# Patient Record
Sex: Female | Born: 1965 | Race: White | Hispanic: No | Marital: Single | Smoking: Current every day smoker
Health system: Southern US, Community
[De-identification: ages and names within clinical notes are randomized; demographics above are authoritative.]

## PROBLEM LIST (undated history)

## (undated) DIAGNOSIS — R519 Headache, unspecified: Secondary | ICD-10-CM

## (undated) DIAGNOSIS — M1990 Unspecified osteoarthritis, unspecified site: Secondary | ICD-10-CM

## (undated) DIAGNOSIS — J189 Pneumonia, unspecified organism: Secondary | ICD-10-CM

## (undated) DIAGNOSIS — K219 Gastro-esophageal reflux disease without esophagitis: Secondary | ICD-10-CM

## (undated) DIAGNOSIS — K5792 Diverticulitis of intestine, part unspecified, without perforation or abscess without bleeding: Secondary | ICD-10-CM

## (undated) DIAGNOSIS — J45909 Unspecified asthma, uncomplicated: Secondary | ICD-10-CM

## (undated) DIAGNOSIS — G709 Myoneural disorder, unspecified: Secondary | ICD-10-CM

## (undated) DIAGNOSIS — I10 Essential (primary) hypertension: Secondary | ICD-10-CM

## (undated) HISTORY — PX: ABLATION: SHX5711

## (undated) HISTORY — PX: DILATION AND CURETTAGE OF UTERUS: SHX78

## (undated) HISTORY — PX: COLONOSCOPY: SHX174

## (undated) HISTORY — PX: CARPAL TUNNEL RELEASE: SHX101

## (undated) HISTORY — PX: ESOPHAGUS SURGERY: SHX626

## (undated) HISTORY — PX: COLECTOMY: SHX59

## (undated) HISTORY — PX: ESOPHAGOGASTRODUODENOSCOPY: SHX1529

---

## 2008-11-02 DIAGNOSIS — J45909 Unspecified asthma, uncomplicated: Secondary | ICD-10-CM | POA: Insufficient documentation

## 2016-02-04 DIAGNOSIS — M5127 Other intervertebral disc displacement, lumbosacral region: Secondary | ICD-10-CM | POA: Insufficient documentation

## 2019-10-12 ENCOUNTER — Other Ambulatory Visit: Payer: Self-pay

## 2019-10-12 ENCOUNTER — Emergency Department
Admission: EM | Admit: 2019-10-12 | Discharge: 2019-10-12 | Disposition: A | Discharge status: Home / Self Care | Payer: Self-pay | Attending: Student in an Organized Health Care Education/Training Program | Admitting: Student in an Organized Health Care Education/Training Program

## 2019-10-12 DIAGNOSIS — M5432 Sciatica, left side: Secondary | ICD-10-CM | POA: Insufficient documentation

## 2019-10-12 DIAGNOSIS — J45909 Unspecified asthma, uncomplicated: Secondary | ICD-10-CM | POA: Insufficient documentation

## 2019-10-12 HISTORY — DX: Unspecified asthma, uncomplicated: J45.909

## 2019-10-12 MED ORDER — LIDOCAINE 5 % EX PTCH
1.0000 | MEDICATED_PATCH | CUTANEOUS | Status: DC
  Administered 2019-10-12: 1 via TRANSDERMAL
  Filled 2019-10-12: qty 1

## 2019-10-12 MED ORDER — CYCLOBENZAPRINE HCL 10 MG PO TABS: 10.0000 mg | ORAL_TABLET | Freq: Three times a day (TID) | ORAL | 0 refills | Status: DC | PRN

## 2019-10-12 MED ORDER — METHYLPREDNISOLONE 4 MG PO TBPK: ORAL_TABLET | ORAL | 0 refills | Status: DC

## 2019-10-12 MED ORDER — ORPHENADRINE CITRATE 30 MG/ML IJ SOLN
60.0000 mg | Freq: Two times a day (BID) | INTRAMUSCULAR | Status: DC
  Administered 2019-10-12: 15:00:00 60 mg via INTRAMUSCULAR
  Filled 2019-10-12: qty 2

## 2019-10-12 MED ORDER — OXYCODONE-ACETAMINOPHEN 7.5-325 MG PO TABS: 1.0000 | ORAL_TABLET | Freq: Four times a day (QID) | ORAL | 0 refills | Status: DC | PRN

## 2019-10-12 MED ORDER — HYDROMORPHONE HCL 1 MG/ML IJ SOLN
1.0000 mg | Freq: Once | INTRAMUSCULAR | Status: AC
  Administered 2019-10-12: 1 mg via INTRAMUSCULAR
  Filled 2019-10-12: qty 1

## 2019-10-12 NOTE — Discharge Instructions (Addendum)
Follow discharge care instruction take medication as directed.  Be advised medication may cause drowsiness. 

## 2019-10-12 NOTE — ED Triage Notes (Signed)
Pt comes via POV from home with c/o lower back pain. Pt states this started this week. 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  Pt states she feels it is her sciatic and it runs down he left leg.  Pt denies any recent injury

## 2019-10-12 NOTE — ED Notes (Signed)
Provider at bedside

## 2019-10-12 NOTE — ED Provider Notes (Signed)
[facility] Emergency Department Provider Note   ____________________________________________   First MD Initiated Contact with Patient 10/12/19 1337     (approximate)  I have reviewed the triage vital signs and the nursing notes.   HISTORY  Chief Complaint Back Pain    HPI Savannah Mcneil is a 54 y.o. female patient complain of right lower back pain with radicular component to the left lower extremity.  Patient has a history of chronic back pain with intermittent sciatica.  Patient denies bladder or bowel dysfunction.  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  Patient said this week she has performed multiple bending lifting techniques to move the patient.  Patient rates the pain as a 10/10.  Patient described the pain as "sharp/aching".  No relief with over-the-counter anti-inflammatory medications.         Past Medical History:  Diagnosis Date  . Asthma     There are no problems to display for this patient.   History reviewed. No pertinent surgical history.  Prior to Admission medications   Medication Sig Start Date End Date Taking? Authorizing Provider  cyclobenzaprine (FLEXERIL) 10 MG tablet Take 1 tablet (10 mg total) by mouth 3 (three) times daily as needed. 10/12/19   Joni Reining, PA-C  methylPREDNISolone (MEDROL DOSEPAK) 4 MG TBPK tablet Take Tapered dose as directed 10/12/19   Joni Reining, PA-C  oxyCODONE-acetaminophen (PERCOCET) 7.5-325 MG tablet Take 1 tablet by mouth every 6 (six) hours as needed for severe pain. 10/12/19   Joni Reining, PA-C    Allergies Patient has no allergy information on record.  No family history on file.  Social History Social History   Tobacco Use  . Smoking status: Not on file  Substance Use Topics  . Alcohol use: Not on file  . Drug use: Not on file    Review of Systems Constitutional: No fever/chills Eyes: No visual changes. ENT: No sore  throat. Cardiovascular: Denies chest pain. Respiratory: Denies shortness of breath. Gastrointestinal: No abdominal pain.  No nausea, no vomiting.  No diarrhea.  No constipation. Genitourinary: Negative for dysuria. Musculoskeletal: Positive for back pain. Skin: Negative for rash. Neurological: Negative for headaches, focal weakness or numbness.   ____________________________________________   PHYSICAL EXAM:  VITAL SIGNS: ED Triage Vitals  Enc Vitals Group     BP 10/12/19 1328 (!) 166/101     Pulse Rate 10/12/19 1328 81     Resp 10/12/19 1328 18     Temp 10/12/19 1328 98.6 F (37 C)     Temp src --      SpO2 10/12/19 1328 97 %     Weight 10/12/19 1326 218 lb (98.9 kg)     Height 10/12/19 1326 5' 5.25" (1.657 m)     Head Circumference --      Peak Flow --      Pain Score 10/12/19 1325 10     Pain Loc --      Pain Edu? --      Excl. in [place]? --     Constitutional: Alert and oriented. Well appearing and in no acute distress. Neck: NNo cervical spine tenderness to palpation. Hematological/Lymphatic/Immunilogical: No cervical lymphadenopathy. Cardiovascular: Normal rate, regular rhythm. Grossly normal heart sounds.  Good peripheral circulation. Respiratory: Normal respiratory effort.  No retractions. Lungs CTAB. Gastrointestinal: Soft and nontender. No distention. No abdominal bruits. No CVA tenderness. Genitourinary: Deferred Musculoskeletal: No obvious deformity to the lumbar spine.  Patient is moderate Guarding with palpation of L3-S1.  No lower extremity tenderness nor edema.  No joint effusions.  Patient negative straight leg test in the supine position. Neurologic:  Normal speech and language. No gross focal neurologic deficits are appreciated. No gait instability. Skin:  Skin is warm, dry and intact. No rash noted. Psychiatric: Mood and affect are normal. Speech and behavior are normal.  ____________________________________________   LABS (all labs ordered are listed,  but only abnormal results are displayed)  Labs Reviewed - No data to display ____________________________________________  EKG   ____________________________________________  RADIOLOGY  ED MD interpretation:    Official radiology report(s): No results found.  ____________________________________________   PROCEDURES  Procedure(s) performed (including Critical Care):  Procedures   ____________________________________________   INITIAL IMPRESSION / ASSESSMENT AND PLAN / ED COURSE  As part of my medical decision making, I reviewed the following data within the [place]     Patient presents with radicular back pain to left lower extremity.  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  Patient given discharge care instruction work note.  Patient advised take medication as directed.  Patient advised to follow-up with treating orthopedic doctor.    Savannah Mcneil was evaluated in Emergency Department on 10/12/2019 for the symptoms described in the history of present illness. She was evaluated in the context of the global COVID-19 pandemic, which necessitated consideration that the patient might be at risk for infection with the SARS-CoV-2 virus that causes COVID-19. Institutional protocols and algorithms that pertain to the evaluation of patients at risk for COVID-19 are in a state of rapid change based on information released by regulatory bodies including the CDC and federal and state organizations. These policies and algorithms were followed during the patient's care in the ED.       ____________________________________________   FINAL CLINICAL IMPRESSION(S) / ED DIAGNOSES  Final diagnoses:  Sciatica of left side     ED Discharge Orders         Ordered    oxyCODONE-acetaminophen (PERCOCET) 7.5-325 MG tablet  Every 6 hours PRN     10/12/19 1518    cyclobenzaprine (FLEXERIL) 10 MG tablet  3  times daily PRN     10/12/19 1518    methylPREDNISolone (MEDROL DOSEPAK) 4 MG TBPK tablet     10/12/19 1518           Note:  This document was prepared using Dragon voice recognition software and may include unintentional dictation errors.    Sable Feil, PA-C 10/12/19 1524    Merlyn Lot, MD 10/13/19 1102

## 2019-12-01 ENCOUNTER — Emergency Department
Admission: EM | Admit: 2019-12-01 | Discharge: 2019-12-01 | Disposition: A | Discharge status: Home / Self Care | Payer: Self-pay | Attending: Emergency Medicine | Admitting: Emergency Medicine

## 2019-12-01 ENCOUNTER — Other Ambulatory Visit: Payer: Self-pay

## 2019-12-01 DIAGNOSIS — M5416 Radiculopathy, lumbar region: Secondary | ICD-10-CM | POA: Insufficient documentation

## 2019-12-01 DIAGNOSIS — M79605 Pain in left leg: Secondary | ICD-10-CM | POA: Insufficient documentation

## 2019-12-01 DIAGNOSIS — J45909 Unspecified asthma, uncomplicated: Secondary | ICD-10-CM | POA: Insufficient documentation

## 2019-12-01 LAB — CBC
HCT: 41.4 % (ref 36.0–46.0)
Hemoglobin: 14.1 g/dL (ref 12.0–15.0)
MCH: 31.8 pg (ref 26.0–34.0)
MCHC: 34.1 g/dL (ref 30.0–36.0)
MCV: 93.5 fL (ref 80.0–100.0)
Platelets: 277 10*3/uL (ref 150–400)
RBC: 4.43 MIL/uL (ref 3.87–5.11)
RDW: 12.2 % (ref 11.5–15.5)
WBC: 6.5 10*3/uL (ref 4.0–10.5)
nRBC: 0 % (ref 0.0–0.2)

## 2019-12-01 LAB — COMPREHENSIVE METABOLIC PANEL
ALT: 14 U/L (ref 0–44)
AST: 18 U/L (ref 15–41)
Albumin: 4.1 g/dL (ref 3.5–5.0)
Alkaline Phosphatase: 78 U/L (ref 38–126)
Anion gap: 7 (ref 5–15)
BUN: 14 mg/dL (ref 6–20)
CO2: 27 mmol/L (ref 22–32)
Calcium: 8.9 mg/dL (ref 8.9–10.3)
Chloride: 103 mmol/L (ref 98–111)
Creatinine, Ser: 0.82 mg/dL (ref 0.44–1.00)
GFR calc Af Amer: >60 mL/min (ref >60)
GFR calc non Af Amer: >60 mL/min (ref >60)
Glucose, Bld: 103 mg/dL — ABNORMAL HIGH (ref 70–99)
Potassium: 4.1 mmol/L (ref 3.5–5.1)
Sodium: 137 mmol/L (ref 135–145)
Total Bilirubin: 0.6 mg/dL (ref 0.3–1.2)
Total Protein: 7.2 g/dL (ref 6.5–8.1)

## 2019-12-01 LAB — LIPASE, BLOOD: Lipase: 31 U/L (ref 11–51)

## 2019-12-01 MED ORDER — PREDNISONE 20 MG PO TABS
60.0000 mg | ORAL_TABLET | Freq: Once | ORAL | Status: AC
  Administered 2019-12-01: 60 mg via ORAL
  Filled 2019-12-01: qty 3

## 2019-12-01 MED ORDER — KETOROLAC TROMETHAMINE 10 MG PO TABS: 10.0000 mg | ORAL_TABLET | Freq: Four times a day (QID) | ORAL | 0 refills | Status: AC | PRN

## 2019-12-01 MED ORDER — KETOROLAC TROMETHAMINE 30 MG/ML IJ SOLN
30.0000 mg | Freq: Once | INTRAMUSCULAR | Status: AC
  Administered 2019-12-01: 30 mg via INTRAMUSCULAR
  Filled 2019-12-01: qty 1

## 2019-12-01 MED ORDER — SODIUM CHLORIDE 0.9% FLUSH: 3.0000 mL | Freq: Once | INTRAVENOUS | Status: DC

## 2019-12-01 MED ORDER — OXYCODONE-ACETAMINOPHEN 5-325 MG PO TABS
1.0000 | ORAL_TABLET | Freq: Once | ORAL | Status: AC
  Administered 2019-12-01: 16:00:00 1 via ORAL
  Filled 2019-12-01: qty 1

## 2019-12-01 MED ORDER — METHOCARBAMOL 500 MG PO TABS: 500.0000 mg | ORAL_TABLET | Freq: Three times a day (TID) | ORAL | 0 refills | Status: AC | PRN

## 2019-12-01 NOTE — ED Triage Notes (Signed)
Pt comes via POV from home with c/o left lower buttocks and leg pain. Pt states throbbing pain that goes down her whole left side.  Pt states this has been going on for 4 days now. Pt denies any N/V/D.  Pt states some abdominal pain at times.

## 2019-12-01 NOTE — ED Triage Notes (Signed)
FIRST NURSE NOTE- here for left side body pain X 4 days. NAD

## 2019-12-01 NOTE — ED Provider Notes (Signed)
Emergency Department Provider Note  ____________________________________________  Time seen: Approximately 3:49 PM  I have reviewed the triage vital signs and the nursing notes.   HISTORY  Chief Complaint Leg Pain and Abdominal Pain   Historian Patient     HPI Savannah Mcneil is a 54 y.o. female presents to the emergency department with left-sided low back pain that radiates along the posterior aspect of the left lower extremity.  Patient states that she has been able to ambulate without difficulty and denies bowel or bladder incontinence or saddle anesthesia.  She has been seen for lumbar radiculopathy multiple times in the past.  No fever or chills at home.  Patient denies abdominal pain.   Past Medical History:  Diagnosis Date  . Asthma      Immunizations up to date:  Yes.     Past Medical History:  Diagnosis Date  . Asthma     There are no problems to display for this patient.   History reviewed. No pertinent surgical history.  Prior to Admission medications   Medication Sig Start Date End Date Taking? Authorizing Provider  ketorolac (TORADOL) 10 MG tablet Take 1 tablet (10 mg total) by mouth every 6 (six) hours as needed for up to 5 days. 12/01/19 12/06/19  Lannie Fields, PA-C  methocarbamol (ROBAXIN) 500 MG tablet Take 1 tablet (500 mg total) by mouth every 8 (eight) hours as needed for up to 5 days. 12/01/19 12/06/19  Lannie Fields, PA-C    Allergies Patient has no allergy information on record.  No family history on file.  Social History Social History   Tobacco Use  . Smoking status: Not on file  Substance Use Topics  . Alcohol use: Not on file  . Drug use: Not on file     Review of Systems  Constitutional: No fever/chills Eyes:  No discharge ENT: No upper respiratory complaints. Respiratory: no cough. No SOB/ use of accessory muscles to breath Gastrointestinal:   No nausea, no vomiting.  No diarrhea.  No constipation. Musculoskeletal:  Patient has low back pain.  Skin: Negative for rash, abrasions, lacerations, ecchymosis.    ____________________________________________   PHYSICAL EXAM:  VITAL SIGNS: ED Triage Vitals  Enc Vitals Group     BP 12/01/19 1435 (!) 157/92     Pulse Rate 12/01/19 1435 68     Resp 12/01/19 1435 18     Temp 12/01/19 1435 98.5 F (36.9 C)     Temp src --      SpO2 12/01/19 1435 100 %     Weight 12/01/19 1436 220 lb (99.8 kg)     Height 12/01/19 1436 5' 5.25" (1.657 m)     Head Circumference --      Peak Flow --      Pain Score 12/01/19 1435 10     Pain Loc --      Pain Edu? --      Excl. in [place]? --      Constitutional: Alert and oriented. Well appearing and in no acute distress. Eyes: Conjunctivae are normal. PERRL. EOMI. Head: Atraumatic. ENT:      Ears: TMs are pearly.       Nose: No congestion/rhinnorhea.      Mouth/Throat: Mucous membranes are moist.  Neck: No stridor.  No cervical spine tenderness to palpation. Cardiovascular: Normal rate, regular rhythm. Normal S1 and S2.  Good peripheral circulation. Respiratory: Normal respiratory effort without tachypnea or retractions. Lungs CTAB. Good air entry to the bases  with no decreased or absent breath sounds Gastrointestinal: Bowel sounds x 4 quadrants. Soft and nontender to palpation. No guarding or rigidity. No distention. Musculoskeletal: Full range of motion to all extremities. No obvious deformities noted. Positive straight leg raise test on the left.  Neurologic:  Normal for age. No gross focal neurologic deficits are appreciated.  Skin:  Skin is warm, dry and intact. No rash noted. Psychiatric: Mood and affect are normal for age. Speech and behavior are normal.   ____________________________________________   LABS (all labs ordered are listed, but only abnormal results are displayed)  Labs Reviewed  COMPREHENSIVE METABOLIC PANEL - Abnormal; Notable for the following components:      Result Value   Glucose, Bld  103 (*)    All other components within normal limits  LIPASE, BLOOD  CBC  URINALYSIS, COMPLETE (UACMP) WITH MICROSCOPIC   ____________________________________________  EKG   ____________________________________________  RADIOLOGY   No results found.  ____________________________________________    PROCEDURES  Procedure(s) performed:     Procedures     Medications  sodium chloride flush (NS) 0.9 % injection 3 mL (3 mLs Intravenous Not Given 12/01/19 1519)  oxyCODONE-acetaminophen (PERCOCET/ROXICET) 5-325 MG per tablet 1 tablet (1 tablet Oral Given 12/01/19 1549)  ketorolac (TORADOL) 30 MG/ML injection 30 mg (30 mg Intramuscular Given 12/01/19 1550)  predniSONE (DELTASONE) tablet 60 mg (60 mg Oral Given 12/01/19 1549)     ____________________________________________   INITIAL IMPRESSION / ASSESSMENT AND PLAN / ED COURSE  Pertinent labs & imaging results that were available during my care of the patient were reviewed by me and considered in my medical decision making (see chart for details).      Assessment and plan Low back pain 54 year old female presents to the emergency department with left-sided low back pain with radiation into the left lower extremity.  Patient was given Percocet, Toradol and prednisone in the emergency department.  She reported that her pain improved significantly.  She was discharged with Toradol and Robaxin.  She was advised to follow-up with neurosurgery, Dr. Marcell Barlow.  All patient questions were answered.    ____________________________________________  FINAL CLINICAL IMPRESSION(S) / ED DIAGNOSES  Final diagnoses:  Lumbar radiculopathy      NEW MEDICATIONS STARTED DURING THIS VISIT:  ED Discharge Orders         Ordered    ketorolac (TORADOL) 10 MG tablet  Every 6 hours PRN     12/01/19 1734    methocarbamol (ROBAXIN) 500 MG tablet  Every 8 hours PRN     12/01/19 1734              This chart was dictated using voice  recognition software/Dragon. Despite best efforts to proofread, errors can occur which can change the meaning. Any change was purely unintentional.     Orvil Feil, PA-C 12/01/19 Deatra Robinson, MD 12/01/19 1946

## 2019-12-01 NOTE — ED Notes (Signed)
See triage note  Presents with lower back pain  States pain is mainly on the left and moves into left leg   Unable to bear wt

## 2019-12-15 ENCOUNTER — Encounter: Payer: Self-pay | Admitting: Emergency Medicine

## 2019-12-15 ENCOUNTER — Other Ambulatory Visit: Payer: Self-pay

## 2019-12-15 ENCOUNTER — Emergency Department
Admission: EM | Admit: 2019-12-15 | Discharge: 2019-12-15 | Disposition: A | Discharge status: Home / Self Care | Payer: Self-pay | Attending: Emergency Medicine | Admitting: Emergency Medicine

## 2019-12-15 DIAGNOSIS — K047 Periapical abscess without sinus: Secondary | ICD-10-CM | POA: Insufficient documentation

## 2019-12-15 DIAGNOSIS — J45909 Unspecified asthma, uncomplicated: Secondary | ICD-10-CM | POA: Insufficient documentation

## 2019-12-15 MED ORDER — TRAMADOL HCL 50 MG PO TABS: 50.0000 mg | ORAL_TABLET | Freq: Four times a day (QID) | ORAL | 0 refills | Status: DC | PRN

## 2019-12-15 MED ORDER — AMOXICILLIN 875 MG PO TABS: 875.0000 mg | ORAL_TABLET | Freq: Two times a day (BID) | ORAL | 0 refills | Status: DC

## 2019-12-15 NOTE — ED Provider Notes (Signed)
[facility] Emergency Department Provider Note  ____________________________________________   First MD Initiated Contact with Patient 12/15/19 1004     (approximate)  I have reviewed the triage vital signs and the nursing notes.   HISTORY  Chief Complaint Facial Swelling   HPI Savannah Mcneil is a 54 y.o. female presents to the ED with complaint of left-sided facial swelling that began slightly yesterday but is worse this morning.  Patient is unaware of any fever or chills.  Patient states that she is taken over-the-counter medication without any relief and for that reason she did not get any sleep last night.  Patient is a smoker.  Currently she rates her pain as a 9 out of 10.      Past Medical History:  Diagnosis Date  . Asthma     There are no problems to display for this patient.   History reviewed. No pertinent surgical history.  Prior to Admission medications   Medication Sig Start Date End Date Taking? Authorizing Provider  amoxicillin (AMOXIL) 875 MG tablet Take 1 tablet (875 mg total) by mouth 2 (two) times daily. 12/15/19   Johnn Hai, PA-C  traMADol (ULTRAM) 50 MG tablet Take 1 tablet (50 mg total) by mouth every 6 (six) hours as needed. 12/15/19   Johnn Hai, PA-C    Allergies Aspirin  No family history on file.  Social History Social History   Tobacco Use  . Smoking status: Not on file  Substance Use Topics  . Alcohol use: Not on file  . Drug use: Not on file    Review of Systems Constitutional: No fever/chills Eyes: No visual changes. ENT: Positive for dental caries.  Positive for left-sided jaw pain. Cardiovascular: Denies chest pain. Respiratory: Denies shortness of breath.  History of asthma, positive smoker. Musculoskeletal: Negative for muscle aches. Skin: Negative for rash. Neurological: Negative for  focal weakness or numbness.  ____________________________________________   PHYSICAL  EXAM:  VITAL SIGNS: ED Triage Vitals [12/15/19 1000]  Enc Vitals Group     BP (!) 152/81     Pulse Rate 83     Resp 20     Temp 98.9 F (37.2 C)     Temp Source Oral     SpO2 96 %     Weight 220 lb (99.8 kg)     Height 5\' 5"  (1.651 m)     Head Circumference      Peak Flow      Pain Score 9     Pain Loc      Pain Edu?      Excl. in [place]?    Constitutional: Alert and oriented. Well appearing and in no acute distress. Eyes: Conjunctivae are normal.  Head: Atraumatic. Nose: No congestion/rhinnorhea. Mouth/Throat: Mucous membranes are moist.  Oropharynx non-erythematous.  Left lower gums are edematous.  Patient has 2 teeth that are broken off at the gumline.  No active drainage was noted. Neck: No stridor.   Cardiovascular: Normal rate, regular rhythm. Grossly normal heart sounds.  Good peripheral circulation. Respiratory: Normal respiratory effort.  No retractions. Lungs minimal bilateral expiratory wheeze heard throughout. Neurologic:  Normal speech and language. No gross focal neurologic deficits are appreciated. No gait instability. Skin:  Skin is warm, dry and intact.  Psychiatric: Mood and affect are normal. Speech and behavior are normal.  ____________________________________________   LABS (all labs ordered are listed, but only abnormal results are displayed)  Labs Reviewed - No data to display  PROCEDURES  Procedure(s) performed (including Critical Care):  Procedures   ____________________________________________   INITIAL IMPRESSION / ASSESSMENT AND PLAN / ED COURSE  As part of my medical decision making, I reviewed the following data within the electronic MEDICAL RECORD NUMBER Notes from prior ED visits and [place] Controlled Substance Database  54 year old female presents to the ED with complaint of left-sided facial swelling.  On exam patient has teeth that are in poor repair and are below the gumline with the gum being edematous.  No active drainage was noted.   Patient is encouraged to follow-up with a dentist.  She was placed on amoxicillin 875 twice daily for 10 days and tramadol 1 every 6 hours as needed for severe pain #8.  Patient will continue taking Tylenol and ibuprofen as needed for pain.  Patient was given a list of dental clinics in the area to follow-up with.  ____________________________________________   FINAL CLINICAL IMPRESSION(S) / ED DIAGNOSES  Final diagnoses:  Dental abscess     ED Discharge Orders         Ordered    amoxicillin (AMOXIL) 875 MG tablet  2 times daily     12/15/19 1044    traMADol (ULTRAM) 50 MG tablet  Every 6 hours PRN     12/15/19 1044           Note:  This document was prepared using Dragon voice recognition software and may include unintentional dictation errors.    Tommi Rumps, PA-C 12/15/19 1052    Sharman Cheek, MD 12/16/19 [phone]

## 2019-12-15 NOTE — ED Triage Notes (Signed)
Pt reports had some pain to her teeth on the left side and this am she woke up with swelling to her left jaw. Pt states that she is not sure if it is a tooth, a gland or if she grinded her teeth in her sleep last night.

## 2019-12-15 NOTE — ED Notes (Signed)
See triage note  Presents with left sided facial swelling  States she woke up with couple of teeth sore yesterday  Now has swelling

## 2019-12-15 NOTE — Discharge Instructions (Signed)
Begin taking antibiotics twice a day for the next 10 days.  Tramadol is every 6 hours if needed you can also take Tylenol or ibuprofen with this medication.  You will need to follow-up with a dentist.  A list of dental clinics are listed on your discharge papers.  Also the clinic at [facility] takes walk-in patients.  OPTIONS FOR DENTAL FOLLOW UP CARE  [place] [facility] and [place] [email or web address] [phone])  [facility] [phone])  [place] [phone])  [place] [phone])  [facility] [phone]) This clinic caters to the indigent population and is on a lottery system. Location: [employer] [facility], [place], [place], [place] [facility] Hours: Wednesdays from 6pm - 9pm, patients seen by a lottery system. For dates, call or go to [email or web address] Services: Cleanings, fillings and simple extractions. Payment Options: DENTAL WORK IS FREE OF CHARGE. Bring proof of income or support. Best way to get seen: Arrive at 5:15 pm - this is a lottery, NOT first come/first serve, so arriving earlier will not increase your chances of being seen.     [facility] [phone] Select option 1 for emergencies   Location: [facility], [place], [address] [place] Clinic Hours: No walk-ins accepted - call the day before to schedule an appointment. Check in times are 9:30 am and 1:30 pm. Services: Simple extractions, temporary fillings, pulpectomy/pulp debridement, uncomplicated abscess drainage. Payment Options: PAYMENT IS DUE AT THE TIME OF SERVICE.  Fee is usually $100-200, additional surgical procedures (e.g. abscess drainage) may be extra. Cash, checks, Visa/MasterCard accepted.  Can file  Medicaid if patient is covered for dental - patient should call case worker to check. No discount for [facility] patients. Best way to get seen: MUST call the day before and get onto the schedule. Can usually be seen the next 1-2 days. No walk-ins accepted.     [place] [phone]   Location: [place], [place] [facility] Hours: M, W, Th, F 8am or 1:30pm, Tues 9a or 1:30 - first come/first served. Services: Simple extractions, temporary fillings, uncomplicated abscess drainage.  You do not need to be an [facility] resident. Payment Options: PAYMENT IS DUE AT THE TIME OF SERVICE. Dental insurance, otherwise sliding scale - bring proof of income or support. Depending on income and treatment needed, cost is usually $50-200. Best way to get seen: Arrive early as it is first come/first served.     [facility] [phone]   Location: [place] [facility] Hours: Mon-Thu 8a-5p Services: Most basic dental services including extractions and fillings. Payment Options: PAYMENT IS DUE AT THE TIME OF SERVICE. Sliding scale, up to 50% off - bring proof if income or support. Medicaid with dental option accepted. Best way to get seen: Call to schedule an appointment, can usually be seen within 2 weeks OR they will try to see walk-ins - show up at [place] or 2p (you may have to wait).     [facility] RESIDENTS ONLY   Location: [facility], [place] [address] [place], [place] [address] Clinic Hours: By appointment only. Monday - Thursday 8am-5pm, Friday 8am-12pm Services: Cleanings, fillings, extractions. Payment Options: PAYMENT IS DUE AT THE TIME OF SERVICE. Cash, Visa or MasterCard. Sliding scale - $  30 minimum per service. Best way to get seen: Come in to office, complete packet and make an appointment - need proof of  income or support monies for each household member and proof of [facility] residence. Usually takes about a month to get in.     [facility] [phone]   Location: [address], [facility] Clinic Hours: Walk-in Urgent Care Dental Services are offered Monday-Friday mornings only. The numbers of emergencies accepted daily is limited to the number of providers available. Maximum 15 - Mondays, Wednesdays & Thursdays Maximum 10 - Tuesdays & Fridays Services: You do not need to be a [facility] resident to be seen for a dental emergency. Emergencies are defined as pain, swelling, abnormal bleeding, or dental trauma. Walkins will receive x-rays if needed. NOTE: Dental cleaning is not an emergency. Payment Options: PAYMENT IS DUE AT THE TIME OF SERVICE. Minimum co-pay is $40.00 for uninsured patients. Minimum co-pay is $3.00 for Medicaid with dental coverage. Dental Insurance is accepted and must be presented at time of visit. Medicare does not cover dental. Forms of payment: Cash, credit card, checks. Best way to get seen: If not previously registered with the clinic, walk-in dental registration begins at 7:15 am and is on a first come/first serve basis. If previously registered with the clinic, call to make an appointment.     [facility] [phone] [place] RESIDENTS ONLY   Location: [address] [place], [place] Clinic Hours: Mon-Thu 10a-2p Services: Extractions only! Payment Options: FREE (donations accepted) - bring proof of income or support Best way to get seen: Call and schedule an appointment OR come at 8am on the 1st Monday of every month (except for holidays) when it is first come/first served.     [facility] [phone]   Location: [address] [place], [place] Clinic Hours: Friday mornings Services, Payment Options, Best way to get seen: Call for info

## 2020-11-08 ENCOUNTER — Other Ambulatory Visit: Payer: Self-pay

## 2020-11-08 ENCOUNTER — Emergency Department (HOSPITAL_COMMUNITY): Payer: 59

## 2020-11-08 ENCOUNTER — Encounter (HOSPITAL_COMMUNITY): Payer: Self-pay

## 2020-11-08 ENCOUNTER — Emergency Department (HOSPITAL_COMMUNITY)
Admission: EM | Admit: 2020-11-08 | Discharge: 2020-11-08 | Disposition: A | Discharge status: Home / Self Care | Payer: 59 | Attending: Emergency Medicine | Admitting: Emergency Medicine

## 2020-11-08 DIAGNOSIS — M545 Low back pain, unspecified: Secondary | ICD-10-CM | POA: Diagnosis present

## 2020-11-08 DIAGNOSIS — M5416 Radiculopathy, lumbar region: Secondary | ICD-10-CM | POA: Diagnosis not present

## 2020-11-08 DIAGNOSIS — J45909 Unspecified asthma, uncomplicated: Secondary | ICD-10-CM | POA: Diagnosis not present

## 2020-11-08 MED ORDER — NAPROXEN 500 MG PO TABS: 500.0000 mg | ORAL_TABLET | Freq: Two times a day (BID) | ORAL | 0 refills | Status: DC

## 2020-11-08 MED ORDER — LIDOCAINE 5 % EX PTCH
1.0000 | MEDICATED_PATCH | CUTANEOUS | Status: DC
  Administered 2020-11-08: 1 via TRANSDERMAL
  Filled 2020-11-08: qty 1

## 2020-11-08 MED ORDER — METHOCARBAMOL 500 MG PO TABS
500.0000 mg | ORAL_TABLET | Freq: Once | ORAL | Status: AC
  Administered 2020-11-08: 500 mg via ORAL
  Filled 2020-11-08: qty 1

## 2020-11-08 MED ORDER — HYDROCODONE-ACETAMINOPHEN 5-325 MG PO TABS
1.0000 | ORAL_TABLET | Freq: Once | ORAL | Status: AC
  Administered 2020-11-08: 1 via ORAL
  Filled 2020-11-08 (×2): qty 1

## 2020-11-08 MED ORDER — IBUPROFEN 800 MG PO TABS: 800.0000 mg | ORAL_TABLET | Freq: Three times a day (TID) | ORAL | 0 refills | Status: DC

## 2020-11-08 MED ORDER — METHOCARBAMOL 500 MG PO TABS: 500.0000 mg | ORAL_TABLET | Freq: Two times a day (BID) | ORAL | 0 refills | Status: DC

## 2020-11-08 MED ORDER — HYDROCODONE-ACETAMINOPHEN 5-325 MG PO TABS: 1.0000 | ORAL_TABLET | Freq: Four times a day (QID) | ORAL | 0 refills | Status: DC | PRN

## 2020-11-08 NOTE — ED Notes (Signed)
Patient transported to CT 

## 2020-11-08 NOTE — ED Notes (Signed)
Pt d/c by MD and is provided w/ d/c instructions and follow up care, pt out of ED in wheel chair with family  

## 2020-11-08 NOTE — ED Provider Notes (Signed)
[facility] EMERGENCY DEPARTMENT Provider Note   CSN: [id number] Arrival date & time: 11/08/20  1143     History Chief Complaint  Patient presents with  . Back Pain    Savannah Mcneil is a 55 y.o. female with a past medical history significant for asthma who presents to the ED due to right-sided low back pain and was 4 days.  Back pain radiates down the posterior aspect of right lower extremity.  Pain is worse with movement especially ambulation.  Patient notes she has a history of multiple lumbar herniated discs and lumbar vertebrae fracture. She is a caregiver and lifts patients frequently. No previous low back surgeries. She has received epidural injections in the past with last being 1.5 years ago. She has tried OTC pain medication with no relief.  Denies saddle paresthesias, bowel/bladder incontinence, lower extremity numbness/tingling, lower extremity weakness, fever/chills, history of cancer, and history of IV drug use.  Denies associated abdominal pain and urinary symptoms. No direct trauma to low back.   Chart reviewed. Patient had an MRI on 03/21/2019 at [facility] which demonstrated: 1. Multilevel degenerative changes of the lumbar spine.  2. At L5-S1, there is a large RIGHT central disc extrusion which effaces the RIGHT lateral recess and exerts mass effect on the descending RIGHT S1 nerve root. Correlate for symptomsof RIGHT S1 radiculopathy.  3. Moderate spinal canal stenosis at L3-L4. Mild spinal canal stenosis at L2-L3, L4-L5, and L5-S1.  4. Multilevel neuroforaminal narrowing as described above.   History obtained from patient and past medical records. No interpreter used during encounter.      Past Medical History:  Diagnosis Date  . Asthma     There are no problems to display for this patient.   History reviewed. No pertinent surgical history.   OB History   No obstetric history on file.     No family history on file.     Home  Medications Prior to Admission medications   Medication Sig Start Date End Date Taking? Authorizing Provider  HYDROcodone-acetaminophen (NORCO/VICODIN) 5-325 MG tablet Take 1 tablet by mouth every 6 (six) hours as needed for severe pain. 11/08/20  Yes Aberman, Merla Riches, PA-C  ibuprofen (ADVIL) 800 MG tablet Take 1 tablet (800 mg total) by mouth 3 (three) times daily. 11/08/20  Yes Aberman, Merla Riches, PA-C  methocarbamol (ROBAXIN) 500 MG tablet Take 1 tablet (500 mg total) by mouth 2 (two) times daily. 11/08/20  Yes Aberman, Merla Riches, PA-C  amoxicillin (AMOXIL) 875 MG tablet Take 1 tablet (875 mg total) by mouth 2 (two) times daily. 12/15/19   Tommi Rumps, PA-C  traMADol (ULTRAM) 50 MG tablet Take 1 tablet (50 mg total) by mouth every 6 (six) hours as needed. 12/15/19   Tommi Rumps, PA-C    Allergies    Aspirin  Review of Systems   Review of Systems  Constitutional: Negative for chills and fever.  Gastrointestinal: Negative for abdominal pain.  Genitourinary: Negative for dysuria.  Musculoskeletal: Positive for back pain.  All other systems reviewed and are negative.   Physical Exam Updated Vital Signs BP (!) 169/116 (BP Location: Left Arm)   Pulse 74   Temp 98.9 F (37.2 C)   Resp 20   SpO2 99%   Physical Exam Vitals and nursing note reviewed.  Constitutional:      General: She is not in acute distress. HENT:     Head: Normocephalic.  Eyes:     Pupils: Pupils are equal,  round, and reactive to light.  Cardiovascular:     Rate and Rhythm: Normal rate and regular rhythm.     Pulses: Normal pulses.     Heart sounds: Normal heart sounds. No murmur heard. No friction rub. No gallop.   Pulmonary:     Effort: Pulmonary effort is normal.     Breath sounds: Normal breath sounds.  Abdominal:     General: Abdomen is flat. There is no distension.     Palpations: Abdomen is soft.     Tenderness: There is no abdominal tenderness. There is no guarding or rebound.      Comments: Abdomen soft, nondistended, nontender to palpation in all quadrants without guarding or peritoneal signs. No rebound.   Musculoskeletal:     Cervical back: Neck supple.     Comments: No thoracic midline tenderness. Lumbar midline tenderness. Tenderness on right sided lumbar paraspinal region. Bilateral lower extremities neurovascularly intact.   Skin:    General: Skin is warm and dry.  Neurological:     General: No focal deficit present.  Psychiatric:        Mood and Affect: Mood normal.        Behavior: Behavior normal.     ED Results / Procedures / Treatments   Labs (all labs ordered are listed, but only abnormal results are displayed) Labs Reviewed - No data to display  EKG None  Radiology CT Lumbar Spine Wo Contrast  Result Date: 11/08/2020 CLINICAL DATA:  Right side low back pain radiating into the right hip and leg. EXAM: CT LUMBAR SPINE WITHOUT CONTRAST TECHNIQUE: Multidetector CT imaging of the lumbar spine was performed without intravenous contrast administration. Multiplanar CT image reconstructions were also generated. COMPARISON:  None. FINDINGS: Segmentation: Standard. Alignment: 0.4 cm anterolisthesis L3 on L4 is due to facet arthropathy. Vertebrae: No fracture or focal pathologic process. Paraspinal and other soft tissues: Aortic atherosclerosis is noted. Disc levels: T11-12: Negative. T12-L1: Negative. L1-2: Negative. L2-3: Mild facet degenerative disease. Minimal disc bulge. No stenosis. L3-4: The patient has moderately severe to severe facet arthropathy, worse on the right. There is ligamentum flavum thickening. Disc uncovering and bulging are seen. Moderately severe to severe central canal stenosis is present and there is mild to moderate bilateral foraminal narrowing. L4-5: Shallow broad-based central protrusion and mild facet degenerative disease. Mild central canal and mild to moderate bilateral foraminal narrowing, worse on the right. L5-S1: Vacuum disc  phenomenon and a shallow bulge. The central canal is open. Moderate to moderately severe bilateral foraminal narrowing. IMPRESSION: No acute abnormality. Spondylosis is worst at L3-4 where advanced facet degenerative change results 0.4 cm anterolisthesis. There is moderately severe to severe central canal stenosis and mild to moderate foraminal narrowing at this level. Mild central canal and right greater than left mild to moderate foraminal narrowing L4-5. Moderate to moderately severe bilateral foraminal narrowing L5-S1. The central canal appears open. Electronically Signed   By: Drusilla Kanner M.D.   On: 11/08/2020 14:59    Procedures Procedures   Medications Ordered in ED Medications  lidocaine (LIDODERM) 5 % 1 patch (1 patch Transdermal Patch Applied 11/08/20 1308)  HYDROcodone-acetaminophen (NORCO/VICODIN) 5-325 MG per tablet 1 tablet (1 tablet Oral Given 11/08/20 1308)  methocarbamol (ROBAXIN) tablet 500 mg (500 mg Oral Given 11/08/20 1308)    ED Course  I have reviewed the triage vital signs and the nursing notes.  Pertinent labs & imaging results that were available during my care of the patient were reviewed by me and  considered in my medical decision making (see chart for details).    MDM Rules/Calculators/A&P                         55 year old female presents to the ED due to right-sided low back pain that radiates down the posterior aspect of RLE.  Denies saddle paresthesias, bowel/bladder incontinence, lower extremity weakness, lower extremity numbness/tingling, history of IV drug use, fever/chills, and history of cancer.  Patient had a previous MRI in 2020-see HPI for results.  Upon arrival, stable vitals.  Patient in no acute distress and non-ill-appearing.  Physical exam significant for lumbar midline tenderness and reproducible right-sided lumbar paraspinal tenderness. Lower extremities neurovascularly intact. Low suspicion for cauda equina or central cord compression. No  infectious symptoms to suggest infection. CT lumbar ordered to rule out worsening stenosis. Norco and lidoderm for symptomatic relief.   CT lumbar personally reviewed which demonstrates: IMPRESSION:  No acute abnormality.    Spondylosis is worst at L3-4 where advanced facet degenerative  change results 0.4 cm anterolisthesis. There is moderately severe to  severe central canal stenosis and mild to moderate foraminal  narrowing at this level.    Mild central canal and right greater than left mild to moderate  foraminal narrowing L4-5.    Moderate to moderately severe bilateral foraminal narrowing L5-S1.  The central canal appears open.   Patient discharged with symptomatic treatment. TOC consulted to help patient find a PCP. Patient ambulated here in the ED. Neurosurgery number given to patient at discharge. Advised patient to call and schedule an appointment for further evaluation. Strict ED precautions discussed with patient. Patient states understanding and agrees to plan. Patient discharged home in no acute distress and stable vitals. Final Clinical Impression(s) / ED Diagnoses Final diagnoses:  Lumbar radiculopathy    Rx / DC Orders ED Discharge Orders         Ordered    HYDROcodone-acetaminophen (NORCO/VICODIN) 5-325 MG tablet  Every 6 hours PRN        11/08/20 1515    naproxen (NAPROSYN) 500 MG tablet  2 times daily,   Status:  Discontinued        11/08/20 1515    methocarbamol (ROBAXIN) 500 MG tablet  2 times daily        11/08/20 1515    ibuprofen (ADVIL) 800 MG tablet  3 times daily        11/08/20 1520           Jesusita Oka 11/08/20 1521    Arby Barrette, MD 11/08/20 [phone]

## 2020-11-08 NOTE — ED Triage Notes (Signed)
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 Pt ambulatory but with pain. Tearful in triage

## 2020-11-08 NOTE — Discharge Instructions (Addendum)
As discussed, your CT scan showed mild worsening of degenerative changes.  I am sending you home with pain medication and a muscle relaxer.  Save the hydrocodone for severe pain and take naproxen for mild to moderate pain.  Hydrocodone and Robaxin can cause drowsiness do not drive or operate machinery while on the medication.  I have included the number of the neurosurgeon.  Please call to schedule appoint for further evaluation.  I have placed a consult for the social worker to help find you a PCP, they should be in contact.  You may also purchase Lidoderm patches and Voltaren gel for added pain relief.  Return to the ER for new or worsening symptoms.

## 2020-12-11 NOTE — Progress Notes (Signed)
Patient ID: Savannah Mcneil, female   DOB: 06/21/1966, 55 y.o.   MRN: [id number]   Virtual Visit via Telephone Note  I connected with Savannah Mcneil on 12/12/20 at  1:50 PM EDT by telephone and verified that I am speaking with the correct person using two identifiers.  Location: Patient: home Provider: [facility] office   I discussed the limitations, risks, security and privacy concerns of performing an evaluation and management service by telephone and the availability of in person appointments. I also discussed with the patient that there may be a patient responsible charge related to this service. The patient expressed understanding and agreed to proceed.   History of Present Illness: After being seen in ED 11/08/2020.  She just saw Savannah Mcneil 3/31 and is ordered to have a new MRI.  She is out of the muscle relaxer.  Not on anything for asthma currently and has not needed anything.  No new issues or concerns.  No labs on file for about 1 year.   From ED A/P: 55 year old female presents to the ED due to right-sided low back pain that radiates down the posterior aspect of RLE.  Denies saddle paresthesias, bowel/bladder incontinence, lower extremity weakness, lower extremity numbness/tingling, history of IV drug use, fever/chills, and history of cancer.  Patient had a previous MRI in 2020-see HPI for results.  Upon arrival, stable vitals.  Patient in no acute distress and non-ill-appearing.  Physical exam significant for lumbar midline tenderness and reproducible right-sided lumbar paraspinal tenderness. Lower extremities neurovascularly intact. Low suspicion for cauda equina or central cord compression. No infectious symptoms to suggest infection. CT lumbar ordered to rule out worsening stenosis. Norco and lidoderm for symptomatic relief.   CT lumbar personally reviewed which demonstrates: IMPRESSION:  No acute abnormality.    Spondylosis is worst at L3-4 where advanced facet degenerative   change results 0.4 cm anterolisthesis. There is moderately severe to  severe central canal stenosis and mild to moderate foraminal  narrowing at this level.    Mild central canal and right greater than left mild to moderate  foraminal narrowing L4-5.    Moderate to moderately severe bilateral foraminal narrowing L5-S1.  The central canal appears open.   Patient discharged with symptomatic treatment. TOC consulted to help patient find a PCP. Patient ambulated here in the ED. Neurosurgery number given to patient at discharge    Observations/Objective:  NAD.  A&Ox3   Assessment and Plan: 1. H/O colectomy stable  2. Arthritis - methocarbamol (ROBAXIN) 500 MG tablet; Take 2 tablets (1,000 mg total) by mouth every 8 (eight) hours as needed for muscle spasms.  Dispense: 60 tablet; Refill: 1  3. Herniated nucleus pulposus, L5-S1 Followed by Savannah Mcneil - methocarbamol (ROBAXIN) 500 MG tablet; Take 2 tablets (1,000 mg total) by mouth every 8 (eight) hours as needed for muscle spasms.  Dispense: 60 tablet; Refill: 1  4. Diverticulosis Added to diagnosis list.  No issues currently    Follow Up Instructions: Assign PCP in 6-8 weeks   I discussed the assessment and treatment plan with the patient. The patient was provided an opportunity to ask questions and all were answered. The patient agreed with the plan and demonstrated an understanding of the instructions.   The patient was advised to call back or seek an in-person evaluation if the symptoms worsen or if the condition fails to improve as anticipated.  I provided 14 minutes of non-face-to-face time during this encounter.   Georgian Co, PA-C

## 2020-12-12 ENCOUNTER — Ambulatory Visit: Payer: 59 | Attending: Physician Assistant | Admitting: Physician Assistant

## 2020-12-12 ENCOUNTER — Encounter: Payer: Self-pay | Admitting: Physician Assistant

## 2020-12-12 ENCOUNTER — Other Ambulatory Visit: Payer: Self-pay

## 2020-12-12 DIAGNOSIS — M5127 Other intervertebral disc displacement, lumbosacral region: Secondary | ICD-10-CM | POA: Diagnosis not present

## 2020-12-12 DIAGNOSIS — K5790 Diverticulosis of intestine, part unspecified, without perforation or abscess without bleeding: Secondary | ICD-10-CM | POA: Diagnosis not present

## 2020-12-12 DIAGNOSIS — Z9049 Acquired absence of other specified parts of digestive tract: Secondary | ICD-10-CM

## 2020-12-12 DIAGNOSIS — M1990 Unspecified osteoarthritis, unspecified site: Secondary | ICD-10-CM | POA: Diagnosis not present

## 2020-12-12 MED ORDER — METHOCARBAMOL 500 MG PO TABS: 1000.0000 mg | ORAL_TABLET | Freq: Three times a day (TID) | ORAL | 1 refills | Status: DC | PRN

## 2020-12-26 ENCOUNTER — Emergency Department (HOSPITAL_COMMUNITY)
Admission: EM | Admit: 2020-12-26 | Discharge: 2020-12-26 | Disposition: A | Discharge status: Home / Self Care | Payer: 59 | Attending: Emergency Medicine | Admitting: Emergency Medicine

## 2020-12-26 ENCOUNTER — Other Ambulatory Visit: Payer: Self-pay

## 2020-12-26 DIAGNOSIS — J45909 Unspecified asthma, uncomplicated: Secondary | ICD-10-CM | POA: Diagnosis not present

## 2020-12-26 DIAGNOSIS — M545 Low back pain, unspecified: Secondary | ICD-10-CM | POA: Insufficient documentation

## 2020-12-26 MED ORDER — METHYLPREDNISOLONE 4 MG PO TBPK: ORAL_TABLET | ORAL | 0 refills | Status: DC

## 2020-12-26 MED ORDER — DEXAMETHASONE SODIUM PHOSPHATE 10 MG/ML IJ SOLN
10.0000 mg | Freq: Once | INTRAMUSCULAR | Status: AC
  Administered 2020-12-26: 10 mg via INTRAMUSCULAR
  Filled 2020-12-26: qty 1

## 2020-12-26 MED ORDER — OXYCODONE-ACETAMINOPHEN 5-325 MG PO TABS
1.0000 | ORAL_TABLET | Freq: Once | ORAL | Status: AC
  Administered 2020-12-26: 1 via ORAL
  Filled 2020-12-26: qty 1

## 2020-12-26 MED ORDER — OXYCODONE-ACETAMINOPHEN 5-325 MG PO TABS: 1.0000 | ORAL_TABLET | Freq: Four times a day (QID) | ORAL | 0 refills | Status: DC | PRN

## 2020-12-26 NOTE — ED Notes (Signed)
Patient verbalizes understanding of discharge instructions. Prescriptions and follow-up care reviewed. Opportunity for questioning and answers were provided. Armband removed by staff, pt discharged from ED ambulatory.  

## 2020-12-26 NOTE — Discharge Instructions (Addendum)
You must keep your appointment for MRI See Dr. Wynetta Emery for ongoing evaluation I have prescribred a Medrol dose pak -taper this over 6 days Oxycodone with tylenol - 1 tablet every 6 hours as needed for severe pain ER for severe or worsening symptoms.

## 2020-12-26 NOTE — ED Notes (Signed)
Pt denies numbness and tingling, loss of bowel or bladder.

## 2020-12-26 NOTE — ED Triage Notes (Signed)
Pt reports chronic back pain, HTN and headache, pt states she seeing a neurologist ans suppose to get an MRI on May 4TH. Pt states she had gotten Robaxin, tramadol and steroid pack with no relief

## 2020-12-26 NOTE — ED Provider Notes (Signed)
Emergency Medicine Provider Triage Evaluation Note  Savannah Mcneil, a 55 y.o. female evaluated in triage.  Pt complains of back pain, worsening over the last few days.  Back pain is lower, radiating down the right leg.  Chronic back pain with herniated disks.  Also reports high blood pressure before leaving the house.  Headache x2 days.  Does not take BP medication.  No PCP.  Sees neurosurgeon for her back, prescribed tramadol, not providing enough relief. No new numbness/weakness, incontinence.   BP (!) 149/92 (BP Location: Right Arm)   Pulse 80   Temp 98.2 F (36.8 C) (Oral)   Resp 16   SpO2 97%   Patient is alert, appears uncomfortable., normal work of breathing    Medically screening exam initiated at 4:43 PM. Appropriate orders placed.  Ralyn Stlaurent was informed that the remainder of the evaluation will be completed by another provider, this initial triage assessment does not replace that evaluation, and the importance of remaining in the ED until their evaluation is complete.       Neiko Trivedi, [place] N, PA-C 12/26/20 1646    Eber Hong, MD 12/26/20 [phone]

## 2020-12-26 NOTE — ED Provider Notes (Signed)
[facility] EMERGENCY DEPARTMENT Provider Note   CSN: [id number] Arrival date & time: 12/26/20  1616     History Chief Complaint  Patient presents with  . Back Pain    Savannah Mcneil is a 55 y.o. female.  HPI    Patient is a 55 year old female presenting with complaints which are somewhat chronic in nature.  Has had back pain which is worsening mid back pain and causes pain in the R leg -she is scheduled for an MRI within the week, she is following up with Dr. Wynetta Emery with spinal surgery, neurosurgery.  She has been on multiple different trials of medications including anti-inflammatories, tramadol, also states she has had some prednisone.  She denies any numbness or weakness and denies any urinary symptoms fevers or history of cancer.  No history of IV drug use.  Past Medical History:  Diagnosis Date  . Asthma     Patient Active Problem List   Diagnosis Date Noted  . H/O colectomy 12/12/2020  . Arthritis 12/12/2020  . Diverticulosis 12/12/2020  . Herniated nucleus pulposus, L5-S1 02/04/2016  . Asthma 11/02/2008    No past surgical history on file.   OB History   No obstetric history on file.     No family history on file.     Home Medications Prior to Admission medications   Medication Sig Start Date End Date Taking? Authorizing Provider  methylPREDNISolone (MEDROL DOSEPAK) 4 MG TBPK tablet Taper over 6 days 12/26/20  Yes Eber Hong, MD  oxyCODONE-acetaminophen (PERCOCET/ROXICET) 5-325 MG tablet Take 1 tablet by mouth every 6 (six) hours as needed for severe pain. 12/26/20  Yes Eber Hong, MD  amoxicillin (AMOXIL) 875 MG tablet Take 1 tablet (875 mg total) by mouth 2 (two) times daily. 12/15/19   Tommi Rumps, PA-C  HYDROcodone-acetaminophen (NORCO/VICODIN) 5-325 MG tablet Take 1 tablet by mouth every 6 (six) hours as needed for severe pain. 11/08/20   Mannie Stabile, PA-C  ibuprofen (ADVIL) 800 MG tablet Take 1 tablet (800 mg total)  by mouth 3 (three) times daily. 11/08/20   Mannie Stabile, PA-C  methocarbamol (ROBAXIN) 500 MG tablet Take 2 tablets (1,000 mg total) by mouth every 8 (eight) hours as needed for muscle spasms. 12/12/20   Anders Simmonds, PA-C  traMADol (ULTRAM) 50 MG tablet Take 1 tablet (50 mg total) by mouth every 6 (six) hours as needed. 12/15/19   Tommi Rumps, PA-C    Allergies    Aspirin  Review of Systems   Review of Systems  Constitutional: Negative for chills and fever.  HENT: Negative for sore throat.   Eyes: Negative for visual disturbance.  Respiratory: Negative for cough and shortness of breath.   Cardiovascular: Negative for chest pain.  Gastrointestinal: Negative for abdominal pain, diarrhea, nausea and vomiting.  Genitourinary: Negative for dysuria and frequency.  Musculoskeletal: Positive for back pain. Negative for neck pain.  Skin: Negative for rash.  Neurological: Negative for weakness, numbness and headaches.  Hematological: Negative for adenopathy.  Psychiatric/Behavioral: Negative for behavioral problems.    Physical Exam Updated Vital Signs BP 135/86   Pulse 76   Temp 98.2 F (36.8 C) (Oral)   Resp 18   SpO2 98%   Physical Exam Vitals and nursing note reviewed.  Constitutional:      General: She is not in acute distress.    Appearance: She is well-developed.  HENT:     Head: Normocephalic and atraumatic.     Mouth/Throat:  Pharynx: No oropharyngeal exudate.  Eyes:     General: No scleral icterus.       Right eye: No discharge.        Left eye: No discharge.     Conjunctiva/sclera: Conjunctivae normal.     Pupils: Pupils are equal, round, and reactive to light.  Neck:     Thyroid: No thyromegaly.     Vascular: No JVD.  Cardiovascular:     Rate and Rhythm: Normal rate and regular rhythm.     Heart sounds: Normal heart sounds. No murmur heard. No friction rub. No gallop.   Pulmonary:     Effort: Pulmonary effort is normal. No respiratory  distress.     Breath sounds: Normal breath sounds. No wheezing or rales.  Abdominal:     General: Bowel sounds are normal. There is no distension.     Palpations: Abdomen is soft. There is no mass.     Tenderness: There is no abdominal tenderness.  Musculoskeletal:        General: Tenderness present. Normal range of motion.     Cervical back: Normal range of motion and neck supple.     Comments: Some lower back ttp across the lower back bialteral  Lymphadenopathy:     Cervical: No cervical adenopathy.  Skin:    General: Skin is warm and dry.     Findings: No erythema or rash.  Neurological:     Mental Status: She is alert.     Coordination: Coordination normal.     Comments: Preserved reflexes, normal strength - can SLR bilaterally - has normal sensation to bilateral LE's - has no weakness, no redness, no swelling  Psychiatric:        Behavior: Behavior normal.     ED Results / Procedures / Treatments   Labs (all labs ordered are listed, but only abnormal results are displayed) Labs Reviewed  URINE CULTURE  URINALYSIS, ROUTINE W REFLEX MICROSCOPIC    EKG None  Radiology No results found.  Procedures Procedures   Medications Ordered in ED Medications  oxyCODONE-acetaminophen (PERCOCET/ROXICET) 5-325 MG per tablet 1 tablet (1 tablet Oral Given 12/26/20 1651)  dexamethasone (DECADRON) injection 10 mg (10 mg Intramuscular Given 12/26/20 1843)  oxyCODONE-acetaminophen (PERCOCET/ROXICET) 5-325 MG per tablet 1 tablet (1 tablet Oral Given 12/26/20 1843)    ED Course  I have reviewed the triage vital signs and the nursing notes.  Pertinent labs & imaging results that were available during my care of the patient were reviewed by me and considered in my medical decision making (see chart for details).    MDM Rules/Calculators/A&P                          Patient has back pain, possibly radicular given the pain down the right leg, is more anterior suggesting that this is not  sciatica.  Patient will need a steroid pack, vital signs are normal, she can follow-up for advanced neuroimaging as ordered.  Check UA - otherwise patient's pain is not associated with high risk features - is stable to f/u for MRI - she is aware of indications for return.  Pt given steroid, percocet and small am't for home, Drug database reviewed - pt has not had many Rx over the last couple of years - no signs of abuse  Final Clinical Impression(s) / ED Diagnoses Final diagnoses:  Acute bilateral low back pain without sciatica    Rx / DC Orders ED  Discharge Orders         Ordered    methylPREDNISolone (MEDROL DOSEPAK) 4 MG TBPK tablet        12/26/20 1912    oxyCODONE-acetaminophen (PERCOCET/ROXICET) 5-325 MG tablet  Every 6 hours PRN        12/26/20 1912           Eber Hong, MD 12/26/20 [phone]

## 2021-01-13 ENCOUNTER — Ambulatory Visit (INDEPENDENT_AMBULATORY_CARE_PROVIDER_SITE_OTHER): Payer: 59 | Admitting: Primary Care

## 2021-02-01 ENCOUNTER — Encounter (HOSPITAL_COMMUNITY): Payer: Self-pay | Admitting: Emergency Medicine

## 2021-02-01 ENCOUNTER — Emergency Department (HOSPITAL_COMMUNITY)
Admission: EM | Admit: 2021-02-01 | Discharge: 2021-02-02 | Disposition: A | Discharge status: Home / Self Care | Payer: 59 | Attending: Emergency Medicine | Admitting: Emergency Medicine

## 2021-02-01 ENCOUNTER — Other Ambulatory Visit: Payer: Self-pay

## 2021-02-01 DIAGNOSIS — W01198A Fall on same level from slipping, tripping and stumbling with subsequent striking against other object, initial encounter: Secondary | ICD-10-CM | POA: Diagnosis not present

## 2021-02-01 DIAGNOSIS — M545 Low back pain, unspecified: Secondary | ICD-10-CM | POA: Diagnosis present

## 2021-02-01 DIAGNOSIS — J45909 Unspecified asthma, uncomplicated: Secondary | ICD-10-CM | POA: Insufficient documentation

## 2021-02-01 MED ORDER — ACETAMINOPHEN 325 MG PO TABS
650.0000 mg | ORAL_TABLET | Freq: Once | ORAL | Status: AC
  Administered 2021-02-01: 650 mg via ORAL
  Filled 2021-02-01: qty 2

## 2021-02-01 NOTE — ED Triage Notes (Signed)
Pt reports she was lying on a swing, it broke and she fell to the ground hitting her back first, causing left lumbar back pain.  No LOC.  Hx of back problems.

## 2021-02-01 NOTE — ED Provider Notes (Signed)
Emergency Medicine Provider Triage Evaluation Note  Savannah Mcneil , a 55 y.o. female  was evaluated in triage.  Pt complains of low back pain. She was sitting on a swing and the chair broke causing her to fall and land on her back. Hit her head, denies LOC. Took ibuprofen prior to arrival.  Review of Systems  Positive: Back pain Negative: Numbness, tingling, LOC, headache, neck pain  Physical Exam  BP 126/80 (BP Location: Left Arm)   Pulse 71   Temp 98.5 F (36.9 C) (Oral)   Resp 18   SpO2 97%  Gen:   Awake, no distress   Resp:  Normal effort  MSK:   Moves extremities without difficulty Other:  Tender to palpation of left paraspinal muscles.  Medical Decision Making  Medically screening exam initiated at 11:30 PM.  Appropriate orders placed.  Kerrie Timm was informed that the remainder of the evaluation will be completed by another provider, this initial triage assessment does not replace that evaluation, and the importance of remaining in the ED until their evaluation is complete.  Xray of lumbar spine and tylenol ordered.    Portions of this note were generated with Scientist, clinical (histocompatibility and immunogenetics). Dictation errors may occur despite best attempts at proofreading.    Shanon Ace, PA-C 02/01/21 2330    Terald Sleeper, MD 02/02/21 [phone]

## 2021-02-02 ENCOUNTER — Emergency Department (HOSPITAL_COMMUNITY): Payer: 59

## 2021-02-02 MED ORDER — HYDROMORPHONE HCL 1 MG/ML IJ SOLN
0.5000 mg | Freq: Once | INTRAMUSCULAR | Status: AC
  Administered 2021-02-02: 0.5 mg via INTRAMUSCULAR
  Filled 2021-02-02: qty 1

## 2021-02-02 MED ORDER — METHOCARBAMOL 500 MG PO TABS: 500.0000 mg | ORAL_TABLET | Freq: Two times a day (BID) | ORAL | 0 refills | Status: DC

## 2021-02-02 MED ORDER — METHOCARBAMOL 500 MG PO TABS
1000.0000 mg | ORAL_TABLET | Freq: Once | ORAL | Status: AC
  Administered 2021-02-02: 1000 mg via ORAL
  Filled 2021-02-02: qty 2

## 2021-02-02 MED ORDER — OXYCODONE HCL 5 MG PO TABS: 5.0000 mg | ORAL_TABLET | Freq: Four times a day (QID) | ORAL | 0 refills | Status: DC | PRN

## 2021-02-02 NOTE — Discharge Instructions (Addendum)
1. Medications: robaxin, roxicodone, usual home medications 2. Treatment: rest, drink plenty of fluids, gentle stretching as discussed, alternate ice and heat 3. Follow Up: Please followup with your primary doctor and neurosurgeon this week for discussion of your diagnoses and further evaluation after today's visit;  Return to the ER for worsening back pain, difficulty walking, loss of bowel or bladder control or other concerning symptoms

## 2021-02-02 NOTE — ED Provider Notes (Signed)
[facility] EMERGENCY DEPARTMENT Provider Note   CSN: [id number] Arrival date & time: 02/01/21  2125     History Chief Complaint  Patient presents with  . Fall       . Back Pain    Savannah Mcneil is a 55 y.o. female presents to the Emergency Department complaining of acute, persistent low back pain.  Patient reports she has a history of low back pain for which she is managed by Dr. Wynetta Emery.  She is supposed to have surgery but has been unable to afford this.  She reports she does not normally take pain medications.  Tonight she was laying on her porch swing when it broke and she struck her back.  She reports since that time she has had an acute worsening of pain in her lumbar spine.  Movement and palpation make it worse.  Nothing seems to make it better.  Patient reports she often uses a walker at home due to her severe back pain.  Denies numbness, tingling, weakness, loss of bowel or bladder control.  The history is provided by the patient, medical records and the spouse. No language interpreter was used.       Past Medical History:  Diagnosis Date  . Asthma     Patient Active Problem List   Diagnosis Date Noted  . H/O colectomy 12/12/2020  . Arthritis 12/12/2020  . Diverticulosis 12/12/2020  . Herniated nucleus pulposus, L5-S1 02/04/2016  . Asthma 11/02/2008    History reviewed. No pertinent surgical history.   OB History   No obstetric history on file.     No family history on file.     Home Medications Prior to Admission medications   Medication Sig Start Date End Date Taking? Authorizing Provider  methocarbamol (ROBAXIN) 500 MG tablet Take 1 tablet (500 mg total) by mouth 2 (two) times daily. 02/02/21  Yes Shahmeer Bunn, Dahlia Client, PA-C  oxyCODONE (ROXICODONE) 5 MG immediate release tablet Take 1 tablet (5 mg total) by mouth every 6 (six) hours as needed for severe pain. 02/02/21  Yes Jasneet Schobert, Dahlia Client, PA-C  amoxicillin (AMOXIL) 875 MG tablet  Take 1 tablet (875 mg total) by mouth 2 (two) times daily. 12/15/19   Tommi Rumps, PA-C  ibuprofen (ADVIL) 800 MG tablet Take 1 tablet (800 mg total) by mouth 3 (three) times daily. 11/08/20   Mannie Stabile, PA-C  methylPREDNISolone (MEDROL DOSEPAK) 4 MG TBPK tablet Taper over 6 days 12/26/20   Eber Hong, MD    Allergies    Aspirin  Review of Systems   Review of Systems  Constitutional: Negative for fever.  HENT: Negative for facial swelling.   Eyes: Negative for visual disturbance.  Respiratory: Negative for shortness of breath.   Cardiovascular: Negative for chest pain.  Gastrointestinal: Negative for abdominal pain.  Musculoskeletal: Positive for back pain. Negative for neck pain.  Neurological: Negative for headaches.    Physical Exam Updated Vital Signs BP 114/75   Pulse 68   Temp 98.5 F (36.9 C) (Oral)   Resp 16   SpO2 97%   Physical Exam Vitals and nursing note reviewed.  Constitutional:      General: She is not in acute distress.    Appearance: She is not diaphoretic.  HENT:     Head: Normocephalic.  Eyes:     General: No scleral icterus.    Conjunctiva/sclera: Conjunctivae normal.  Cardiovascular:     Rate and Rhythm: Normal rate and regular rhythm.  Pulses: Normal pulses.          Radial pulses are 2+ on the right side and 2+ on the left side.  Pulmonary:     Effort: No tachypnea, accessory muscle usage, prolonged expiration, respiratory distress or retractions.     Breath sounds: No stridor.     Comments: Equal chest rise. No increased work of breathing. Abdominal:     General: There is no distension.     Palpations: Abdomen is soft.     Tenderness: There is no abdominal tenderness. There is no guarding or rebound.  Musculoskeletal:     Cervical back: Normal and normal range of motion. No tenderness.     Thoracic back: Normal.     Lumbar back: Tenderness and bony tenderness present. Negative right straight leg raise test and negative  left straight leg raise test.       Back:     Comments: Moves all extremities equally and without difficulty.  Skin:    General: Skin is warm and dry.     Capillary Refill: Capillary refill takes less than 2 seconds.  Neurological:     Mental Status: She is alert.     GCS: GCS eye subscore is 4. GCS verbal subscore is 5. GCS motor subscore is 6.     Comments: Speech is clear and goal oriented.  Psychiatric:        Mood and Affect: Mood normal.     ED Results / Procedures / Treatments    Radiology DG Lumbar Spine Complete  Result Date: 02/02/2021 CLINICAL DATA:  Fall, back pain EXAM: LUMBAR SPINE - COMPLETE 4+ VIEW COMPARISON:  01/07/2021 FINDINGS: Normal lumbar lordosis. Grade 1 anterolisthesis of L3 upon L4 is unchanged. No acute fracture or listhesis of the lumbar spine. Intervertebral disc space narrowing and minimal endplate remodeling is noted at L5-S1 and, to a lesser extent, L3-4 and L4-5 in keeping with changes of mild to moderate degenerative disc disease. Facet arthrosis at L3-4 is not well profiled on this examination. No pars defect identified. The paraspinal soft tissues are unremarkable. IMPRESSION: Stable changes of degenerative disc disease with grade 1 anterolisthesis of L3 upon L4. No acute fracture or listhesis. Electronically Signed   By: Helyn Numbers MD   On: 02/02/2021 01:05    Procedures Procedures   Medications Ordered in ED Medications  acetaminophen (TYLENOL) tablet 650 mg (650 mg Oral Given 02/01/21 2330)  methocarbamol (ROBAXIN) tablet 1,000 mg (1,000 mg Oral Given 02/02/21 0504)  HYDROmorphone (DILAUDID) injection 0.5 mg (0.5 mg Intramuscular Given 02/02/21 0505)    ED Course  I have reviewed the triage vital signs and the nursing notes.  Pertinent labs & imaging results that were available during my care of the patient were reviewed by me and considered in my medical decision making (see chart for details).    MDM Rules/Calculators/A&P                           Patient presents with acute on chronic back pain after fall today.  Plain films without acute abnormality.  I personally evaluated these images.  Chronic degeneration is again noted.  Patient given pain control here in the emergency department.  She walks with antalgic gait but no ataxia.  Reports she is feeling better and wishes for discharge.  Discussed home therapies, gentle stretching, close follow-up with neurosurgery and reasons to return to emergency department.  Patient and her husband state understanding  and are in agreement with the plan.   Final Clinical Impression(s) / ED Diagnoses Final diagnoses:  Acute midline low back pain without sciatica    Rx / DC Orders ED Discharge Orders         Ordered    oxyCODONE (ROXICODONE) 5 MG immediate release tablet  Every 6 hours PRN        02/02/21 0519    methocarbamol (ROBAXIN) 500 MG tablet  2 times daily        02/02/21 0519           Nikol Lemar, Dahlia Client, PA-C 02/02/21 6728    Zadie Rhine, MD 02/03/21 [phone]

## 2021-06-03 ENCOUNTER — Encounter: Payer: Self-pay | Admitting: Physical Medicine & Rehabilitation

## 2021-06-06 ENCOUNTER — Other Ambulatory Visit: Payer: Self-pay

## 2021-06-06 ENCOUNTER — Emergency Department (HOSPITAL_COMMUNITY)
Admission: EM | Admit: 2021-06-06 | Discharge: 2021-06-06 | Disposition: A | Discharge status: Home / Self Care | Payer: 59 | Attending: Emergency Medicine | Admitting: Emergency Medicine

## 2021-06-06 ENCOUNTER — Emergency Department (HOSPITAL_COMMUNITY): Payer: 59

## 2021-06-06 DIAGNOSIS — M545 Low back pain, unspecified: Secondary | ICD-10-CM | POA: Diagnosis present

## 2021-06-06 DIAGNOSIS — W108XXA Fall (on) (from) other stairs and steps, initial encounter: Secondary | ICD-10-CM | POA: Insufficient documentation

## 2021-06-06 DIAGNOSIS — J45909 Unspecified asthma, uncomplicated: Secondary | ICD-10-CM | POA: Diagnosis not present

## 2021-06-06 MED ORDER — ACETAMINOPHEN 325 MG PO TABS
650.0000 mg | ORAL_TABLET | Freq: Once | ORAL | Status: DC
  Filled 2021-06-06: qty 2

## 2021-06-06 MED ORDER — OXYCODONE-ACETAMINOPHEN 5-325 MG PO TABS: 1.0000 | ORAL_TABLET | Freq: Three times a day (TID) | ORAL | 0 refills | Status: DC | PRN

## 2021-06-06 MED ORDER — OXYCODONE-ACETAMINOPHEN 5-325 MG PO TABS
1.0000 | ORAL_TABLET | Freq: Once | ORAL | Status: AC
Start: 2021-06-06 — End: 2021-06-06
  Administered 2021-06-06: 1 via ORAL
  Filled 2021-06-06: qty 1

## 2021-06-06 NOTE — Discharge Instructions (Addendum)
Return for any problem.  Follow-up with your regular care provider on Monday as instructed.

## 2021-06-06 NOTE — ED Triage Notes (Signed)
Pt had mechanical fall down approx 3 steps this morning. Did not hit head and did not pass out. Was able to ease herself to the ground so she did not hit the ground with much force, however, she did twist her back to where she has residual lower back pain. Hx herniated disc.

## 2021-06-06 NOTE — ED Notes (Signed)
Called for vitals. No response 

## 2021-06-06 NOTE — ED Provider Notes (Signed)
[facility] EMERGENCY DEPARTMENT Provider Note   CSN: [id number] Arrival date & time: 06/06/21  0865     History Chief Complaint  Patient presents with   Back Pain    Savannah Mcneil is a 55 y.o. female.  55 year old female with prior medical history as detailed below presents for evaluation.  Patient reports fall from standing.  She fell down hard on her backside on a flight of stairs.  She did not hit her head.  She did not pass out.  She denies neck pain.  She reports prior history of chronic low back pain.  Patient was ambulatory after the fall.  The history is provided by the patient.  Back Pain Location:  Lumbar spine Quality:  Aching Radiates to:  Does not radiate Pain severity:  Moderate Onset quality:  Sudden Duration:  10 hours Timing:  Constant Progression:  Waxing and waning Chronicity:  New     Past Medical History:  Diagnosis Date   Asthma     Patient Active Problem List   Diagnosis Date Noted   H/O colectomy 12/12/2020   Arthritis 12/12/2020   Diverticulosis 12/12/2020   Herniated nucleus pulposus, L5-S1 02/04/2016   Asthma 11/02/2008    No past surgical history on file.   OB History   No obstetric history on file.     No family history on file.     Home Medications Prior to Admission medications   Medication Sig Start Date End Date Taking? Authorizing Provider  gabapentin (NEURONTIN) 400 MG capsule Take 400 mg by mouth 3 (three) times daily. 05/27/21  Yes [provider]  hydrochlorothiazide (HYDRODIURIL) 12.5 MG tablet Take 12.5 mg by mouth daily. 05/28/21  Yes [provider]  ibuprofen (ADVIL) 800 MG tablet Take 1 tablet (800 mg total) by mouth 3 (three) times daily. 11/08/20  Yes Aberman, Merla Riches, PA-C  omeprazole (PRILOSEC) 40 MG capsule Take 40 mg by mouth daily. 05/27/21  Yes [provider]  oxyCODONE-acetaminophen (PERCOCET/ROXICET) 5-325 MG tablet Take 1 tablet by mouth every 8  (eight) hours as needed for severe pain. 06/06/21  Yes Wynetta Fines, MD  traZODone (DESYREL) 100 MG tablet Take 100 mg by mouth at bedtime. 05/27/21  Yes [provider]  amoxicillin (AMOXIL) 875 MG tablet Take 1 tablet (875 mg total) by mouth 2 (two) times daily. Patient not taking: Reported on 06/06/2021 12/15/19   Tommi Rumps, PA-C  APO-VARENICLINE 1 MG tablet Take 1 mg by mouth 2 (two) times daily. 05/28/21   [provider]  methocarbamol (ROBAXIN) 500 MG tablet Take 1 tablet (500 mg total) by mouth 2 (two) times daily. Patient not taking: Reported on 06/06/2021 02/02/21   Muthersbaugh, Dahlia Client, PA-C  methylPREDNISolone (MEDROL DOSEPAK) 4 MG TBPK tablet Taper over 6 days Patient not taking: Reported on 06/06/2021 12/26/20   Eber Hong, MD  oxyCODONE (ROXICODONE) 5 MG immediate release tablet Take 1 tablet (5 mg total) by mouth every 6 (six) hours as needed for severe pain. Patient not taking: Reported on 06/06/2021 02/02/21   Muthersbaugh, Dahlia Client, PA-C    Allergies    Aspirin and Naproxen  Review of Systems   Review of Systems  Musculoskeletal:  Positive for back pain.  All other systems reviewed and are negative.  Physical Exam Updated Vital Signs BP (!) 129/111 (BP Location: Right Arm)   Pulse 67   Temp 98.1 F (36.7 C) (Oral)   Resp 18   SpO2 96%   Physical Exam  Vitals and nursing note reviewed.  Constitutional:      General: She is not in acute distress.    Appearance: Normal appearance. She is well-developed.  HENT:     Head: Normocephalic and atraumatic.  Eyes:     Conjunctiva/sclera: Conjunctivae normal.     Pupils: Pupils are equal, round, and reactive to light.  Cardiovascular:     Rate and Rhythm: Normal rate and regular rhythm.     Heart sounds: Normal heart sounds.  Pulmonary:     Effort: Pulmonary effort is normal. No respiratory distress.     Breath sounds: Normal breath sounds.  Abdominal:     General: There is no distension.      Palpations: Abdomen is soft.     Tenderness: There is no abdominal tenderness.  Musculoskeletal:        General: Tenderness present. No deformity. Normal range of motion.     Cervical back: Normal range of motion and neck supple.     Comments: Mild diffuse tenderness over the lower lumbar spine.  No specific midline tenderness noted.  No midline step-off or deformity noted.  Skin:    General: Skin is warm and dry.  Neurological:     General: No focal deficit present.     Mental Status: She is alert and oriented to person, place, and time.    ED Results / Procedures / Treatments   Labs (all labs ordered are listed, but only abnormal results are displayed) Labs Reviewed - No data to display  EKG None  Radiology DG Lumbar Spine Complete  Result Date: 06/06/2021 CLINICAL DATA:  55 year old female status post fall down steps. EXAM: LUMBAR SPINE - COMPLETE 4+ VIEW COMPARISON:  Lumbar radiographs 02/02/2021.  Lumbar CT 11/08/2020. FINDINGS: Normal lumbar segmentation. Stable vertebral height and alignment. Mild chronic grade 1 anterolisthesis of L3 on L4. Visible lower thoracic levels also appears stable. Stable disc spaces, chronic L5-S1 vacuum disc. Lumbar facet hypertrophy, maximal at L3-L4. No pars fracture. No acute osseous abnormality identified. Negative visible lower chest and abdominal visceral contours. IMPRESSION: No acute osseous abnormality identified in the lumbar spine. Electronically Signed   By: Odessa Fleming M.D.   On: 06/06/2021 10:58    Procedures Procedures   Medications Ordered in ED Medications  acetaminophen (TYLENOL) tablet 650 mg (has no administration in time range)  oxyCODONE-acetaminophen (PERCOCET/ROXICET) 5-325 MG per tablet 1 tablet (1 tablet Oral Given 06/06/21 2026)    ED Course  I have reviewed the triage vital signs and the nursing notes.  Pertinent labs & imaging results that were available during my care of the patient were reviewed by me and considered  in my medical decision making (see chart for details).    MDM Rules/Calculators/A&P                           MDM  MSE complete  Savannah Mcneil was evaluated in Emergency Department on 06/06/2021 for the symptoms described in the history of present illness. She was evaluated in the context of the global COVID-19 pandemic, which necessitated consideration that the patient might be at risk for infection with the SARS-CoV-2 virus that causes COVID-19. Institutional protocols and algorithms that pertain to the evaluation of patients at risk for COVID-19 are in a state of rapid change based on information released by regulatory bodies including the CDC and federal and state organizations. These policies and algorithms were followed during the patient's care in the ED.  Patient is presenting after fall from standing.  She complains of low back pain.  Imaging is without evidence of significant fracture or other abnormality.  Patient is improved after administration of pain medicine here in the ED.  Importance of close follow-up is stressed.  Strict return precautions given and understood. Final Clinical Impression(s) / ED Diagnoses Final diagnoses:  Acute low back pain without sciatica, unspecified back pain laterality    Rx / DC Orders ED Discharge Orders          Ordered    oxyCODONE-acetaminophen (PERCOCET/ROXICET) 5-325 MG tablet  Every 8 hours PRN        06/06/21 2054             Wynetta Fines, MD 06/06/21 2114

## 2021-06-06 NOTE — ED Provider Notes (Signed)
Emergency Medicine Provider Triage Evaluation Note  Savannah Mcneil , a 55 y.o. female  was evaluated in triage.  Pt complains of fall and back pain.  History of herniated disks and weakness.  Patient states that her leg gave out while she was walking down stairs.  She twisted to the side, grabbed the railing and eased herself down.  She complains of sudden onset lumbar pain.  No weakness or weakness in the lower extremities, no saddle anesthesia or paresthesias.  Review of Systems  Positive: Back pain Negative: Weakness  Physical Exam  BP (!) 158/102 (BP Location: Right Arm)   Pulse 77   Temp 98.7 F (37.1 C) (Oral)   Resp (!) 22   SpO2 99%  Gen:   Awake, no distress   Resp:  Normal effort  MSK:   Moves extremities without difficulty  Other:  Ttp bl LUMBAR REGION  Medical Decision Making  Medically screening exam initiated at 10:03 AM.  Appropriate orders placed.  Jennings Stirling was informed that the remainder of the evaluation will be completed by another provider, this initial triage assessment does not replace that evaluation, and the importance of remaining in the ED until their evaluation is complete.  WORK UP INITIATED   Arthor Captain, PA-C 06/06/21 1008    Ernie Avena, MD 06/06/21 1049

## 2021-07-17 ENCOUNTER — Encounter: Payer: Self-pay | Admitting: Family Medicine

## 2021-07-17 ENCOUNTER — Ambulatory Visit (INDEPENDENT_AMBULATORY_CARE_PROVIDER_SITE_OTHER): Payer: 59 | Admitting: Family Medicine

## 2021-07-17 VITALS — BP 130/90 | Ht 65.25 in | Wt 230.0 lb

## 2021-07-17 DIAGNOSIS — M222X1 Patellofemoral disorders, right knee: Secondary | ICD-10-CM

## 2021-07-17 DIAGNOSIS — M160 Bilateral primary osteoarthritis of hip: Secondary | ICD-10-CM | POA: Insufficient documentation

## 2021-07-17 DIAGNOSIS — M222X2 Patellofemoral disorders, left knee: Secondary | ICD-10-CM

## 2021-07-17 MED ORDER — HYDROCODONE-ACETAMINOPHEN 5-325 MG PO TABS: 1.0000 | ORAL_TABLET | Freq: Three times a day (TID) | ORAL | 0 refills | Status: DC | PRN

## 2021-07-17 MED ORDER — BACLOFEN 10 MG PO TABS: 5.0000 mg | ORAL_TABLET | Freq: Two times a day (BID) | ORAL | 1 refills | Status: DC | PRN

## 2021-07-17 NOTE — Patient Instructions (Signed)
Nice to meet you Please try ice  Please try the exercises  Please use the norco as needed   Please send me a message in MyChart with any questions or updates.  Please see me back as needed.   --Dr. Jordan Likes

## 2021-07-17 NOTE — Assessment & Plan Note (Signed)
Likely patellofemoral in nature given the extent of degenerative changes in her hips. -Could consider physical therapy.

## 2021-07-17 NOTE — Assessment & Plan Note (Signed)
Has significant degenerative changes of the hips that are likely contributing to her back and knee pain. -Counseled on home exercise therapy and supportive care. -Rollator. -Baclofen. -Norco. -Referral to orthopedics for consideration of arthroplasty.

## 2021-07-17 NOTE — Progress Notes (Signed)
  Savannah Mcneil - 55 y.o. female MRN [id number]  Date of birth: [birth year]  SUBJECTIVE:  Including CC & ROS.  No chief complaint on file.   Savannah Mcneil is a 55 y.o. female that is presenting with acute on chronic bilateral hip pain.  The pain is severe in nature.  She has pain at night as well as with ambulation.  Has tried different medications in the past and home exercises..  Independent review of the bilateral hip x-rays from 11/2 shows end-stage degenerative changes of the hip joints   Review of Systems See HPI   HISTORY: Past Medical, Surgical, Social, and Family History Reviewed & Updated per EMR.   Pertinent Historical Findings include:  Past Medical History:  Diagnosis Date   Asthma     History reviewed. No pertinent surgical history.  History reviewed. No pertinent family history.  Social History   Socioeconomic History   Marital status: Single    Spouse name: Not on file   Number of children: Not on file   Years of education: Not on file   Highest education level: Not on file  Occupational History   Not on file  Tobacco Use   Smoking status: Not on file   Smokeless tobacco: Not on file  Substance and Sexual Activity   Alcohol use: Not on file   Drug use: Not on file   Sexual activity: Not on file  Other Topics Concern   Not on file  Social History Narrative   Not on file   Social Determinants of Health   Financial Resource Strain: Not on file  Food Insecurity: Not on file  Transportation Needs: Not on file  Physical Activity: Not on file  Stress: Not on file  Social Connections: Not on file  Intimate Partner Violence: Not on file     PHYSICAL EXAM:  VS: BP 130/90 (BP Location: Right Arm, Patient Position: Sitting)   Ht 5' 5.25" (1.657 m)   Wt 230 lb (104.3 kg)   BMI 37.98 kg/m  Physical Exam Gen: NAD, alert, cooperative with exam, well-appearing    ASSESSMENT & PLAN:   Primary osteoarthritis of both hips Has significant  degenerative changes of the hips that are likely contributing to her back and knee pain. -Counseled on home exercise therapy and supportive care. -Rollator. -Baclofen. -Norco. -Referral to orthopedics for consideration of arthroplasty.  Patellofemoral pain syndrome of both knees Likely patellofemoral in nature given the extent of degenerative changes in her hips. -Could consider physical therapy.

## 2021-08-05 ENCOUNTER — Encounter: Payer: 59 | Attending: Physical Medicine & Rehabilitation | Admitting: Physical Medicine & Rehabilitation

## 2021-08-05 ENCOUNTER — Other Ambulatory Visit: Payer: Self-pay

## 2021-08-05 ENCOUNTER — Encounter: Payer: Self-pay | Admitting: Physical Medicine & Rehabilitation

## 2021-08-05 VITALS — BP 132/84 | HR 78 | Temp 98.8°F | Ht 65.0 in | Wt 229.0 lb

## 2021-08-05 DIAGNOSIS — M160 Bilateral primary osteoarthritis of hip: Secondary | ICD-10-CM | POA: Diagnosis present

## 2021-08-05 MED ORDER — ACETAMINOPHEN-CODEINE #3 300-30 MG PO TABS: 1.0000 | ORAL_TABLET | Freq: Every evening | ORAL | 0 refills | Status: DC | PRN

## 2021-08-05 NOTE — Progress Notes (Signed)
Subjective:    Patient ID: Savannah Mcneil, female    DOB: 10-17-1965, 55 y.o.   MRN: [id number]  HPI 55 year old female with history of hypertension diverticulosis*status post partial colectomy who gives a 8-year history of low back hip and lower extremity pain.  Her average pain is in the 8 out of 10 range described as sharp stabbing constant aching.  Morning and evening times are the worst pain does inhibit sleep pain worse with walking bending standing improves with heat ice medication as well as TENS unit.  She has a 1 to 2-minute walking tolerance using a walker.  She does not drive she cannot climb steps very well but she does them.  She last worked in April 2022 she needs assist with bathing and shopping.  She has had problems with weight gain.  She takes about 8 Tylenol per day as well as reports taking 12 ibuprofen per day she is in bed around 6 to 8 hours sitting for hours standing 1 to 2 hours walk 3 hours she does not exercise Reviewed notes from [facility], additional history positive asthma tobacco abuse Hip and low back pain Seen by PCP who referred pt to Dr Jordan Likes, sports medicine, , who referred to Dr Dion Saucier, orthopedics Diagnosed with end-stage osteoarthritis of both hips Pain Inventory Average Pain 8 Pain Right Now 10 My pain is constant, sharp, stabbing, and aching  In the last 24 hours, has pain interfered with the following? General activity 10 Relation with others 10 Enjoyment of life 10 What TIME of day is your pain at its worst? morning  and evening Sleep (in general) Poor  Pain is worse with: walking, bending, standing, and some activites Pain improves with: heat/ice, medication, and TENS Relief from Meds: 9  walk without assistance use a cane use a walker ability to climb steps?  yes do you drive?  yes  not employed: date last employed 12/13/2020 I need assistance with the following:  bathing and shopping  trouble  walking  Will be having hip replacement in January   N/a    No family history on file. Social History   Socioeconomic History   Marital status: Single    Spouse name: Not on file   Number of children: Not on file   Years of education: Not on file   Highest education level: Not on file  Occupational History   Not on file  Tobacco Use   Smoking status: Not on file   Smokeless tobacco: Not on file  Substance and Sexual Activity   Alcohol use: Not on file   Drug use: Not on file   Sexual activity: Not on file  Other Topics Concern   Not on file  Social History Narrative   Not on file   Social Determinants of Health   Financial Resource Strain: Not on file  Food Insecurity: Not on file  Transportation Needs: Not on file  Physical Activity: Not on file  Stress: Not on file  Social Connections: Not on file   No past surgical history on file. Past Medical History:  Diagnosis Date   Asthma    BP 132/84   Pulse 78   Ht 5\' 5"  (1.651 m)   Wt 229 lb (103.9 kg)   SpO2 96%   BMI 38.11 kg/m   Opioid Risk Score:   Fall Risk Score:  `1  Depression screen PHQ 2/9  No flowsheet data found.    Review of Systems  Constitutional:  Positive for appetite change and unexpected weight change.  HENT: Negative.    Eyes: Negative.   Respiratory: Negative.    Cardiovascular: Negative.   Gastrointestinal: Negative.   Endocrine: Negative.   Genitourinary: Negative.   Musculoskeletal:  Positive for back pain and gait problem.  Skin: Negative.   Allergic/Immunologic: Negative.   Hematological: Negative.   Psychiatric/Behavioral: Negative.        Objective:   Physical Exam Vitals and nursing note reviewed.  Constitutional:      Appearance: She is obese.  HENT:     Head: Normocephalic and atraumatic.  Eyes:     Extraocular Movements: Extraocular movements intact.     Conjunctiva/sclera: Conjunctivae normal.     Pupils: Pupils are equal, round, and reactive to light.   Cardiovascular:     Rate and Rhythm: Normal rate and regular rhythm.     Heart sounds: Normal heart sounds. No murmur heard. Pulmonary:     Effort: Pulmonary effort is normal. No respiratory distress.     Breath sounds: Normal breath sounds.  Abdominal:     General: Abdomen is flat. Bowel sounds are normal. There is no distension.     Palpations: Abdomen is soft.     Tenderness: There is no abdominal tenderness.  Musculoskeletal:     Cervical back: Normal range of motion.  Skin:    General: Skin is warm and dry.  Neurological:     Mental Status: She is alert and oriented to person, place, and time.     Comments: Motor strength is 5/5 bilateral deltoid, bicep, tricep, grip, hip flexion, knee extension ankle dorsiflexion plantarflexion Normal sensation to light touch bilateral upper and lower limbs Negative straight leg raising bilaterally   Psychiatric:        Mood and Affect: Mood normal. Affect is labile.        Behavior: Behavior normal.     Comments: Cried when describing her pain  Musculoskeletal there is pain with hip internal extra rotation hip internal rotation is limited to 0 degrees external rotation is a problem 25% of normal She ambulates with a walker no evidence of toe drag or knee instability Lumbar spine has no tenderness to palpation lumbar paraspinal area        Assessment & Plan:  #1.  Bilateral end-stage hip osteoarthritis this is likely main pain generator.  She is scheduled for hip replacement first on the right followed by the left side after adequate recovery. Plans to have surgery next month.  She complains of pain inhibiting sleep.  Will prescribe Tylenol 3 30 tablets 1 p.o. nightly. As discussed with the patient she will be undergoing hip replacement surgery, she will have pain management as per orthopedics postoperatively.  Given high likelihood of improvement in her underlying pain problems do not think she needs a PM&R follow-up unless she fails to  make improvements as expected.

## 2021-08-05 NOTE — Patient Instructions (Signed)
I wrote a limited supply of pain medication to be used at night until you undergo surgery.  Post operative pain will be managed by ortho

## 2021-08-17 ENCOUNTER — Other Ambulatory Visit: Payer: Self-pay

## 2021-08-17 ENCOUNTER — Emergency Department: Payer: 59

## 2021-08-17 DIAGNOSIS — Z20822 Contact with and (suspected) exposure to covid-19: Secondary | ICD-10-CM | POA: Insufficient documentation

## 2021-08-17 DIAGNOSIS — R072 Precordial pain: Secondary | ICD-10-CM | POA: Diagnosis present

## 2021-08-17 DIAGNOSIS — J45909 Unspecified asthma, uncomplicated: Secondary | ICD-10-CM | POA: Diagnosis not present

## 2021-08-17 DIAGNOSIS — Z79899 Other long term (current) drug therapy: Secondary | ICD-10-CM | POA: Diagnosis not present

## 2021-08-17 DIAGNOSIS — R112 Nausea with vomiting, unspecified: Secondary | ICD-10-CM | POA: Diagnosis not present

## 2021-08-17 DIAGNOSIS — I10 Essential (primary) hypertension: Secondary | ICD-10-CM | POA: Diagnosis not present

## 2021-08-17 LAB — CBC WITH DIFFERENTIAL/PLATELET
Abs Immature Granulocytes: 0.02 10*3/uL (ref 0.00–0.07)
Basophils Absolute: 0.1 10*3/uL (ref 0.0–0.1)
Basophils Relative: 1 %
Eosinophils Absolute: 0.3 10*3/uL (ref 0.0–0.5)
Eosinophils Relative: 5 %
HCT: 39.5 % (ref 36.0–46.0)
Hemoglobin: 13.9 g/dL (ref 12.0–15.0)
Immature Granulocytes: 0 %
Lymphocytes Relative: 42 %
Lymphs Abs: 3 10*3/uL (ref 0.7–4.0)
MCH: 31.9 pg (ref 26.0–34.0)
MCHC: 35.2 g/dL (ref 30.0–36.0)
MCV: 90.6 fL (ref 80.0–100.0)
Monocytes Absolute: 0.5 10*3/uL (ref 0.1–1.0)
Monocytes Relative: 6 %
Neutro Abs: 3.3 10*3/uL (ref 1.7–7.7)
Neutrophils Relative %: 46 %
Platelets: 286 10*3/uL (ref 150–400)
RBC: 4.36 MIL/uL (ref 3.87–5.11)
RDW: 11.9 % (ref 11.5–15.5)
WBC: 7.2 10*3/uL (ref 4.0–10.5)
nRBC: 0 % (ref 0.0–0.2)

## 2021-08-17 LAB — COMPREHENSIVE METABOLIC PANEL
ALT: 14 U/L (ref 0–44)
AST: 23 U/L (ref 15–41)
Albumin: 4 g/dL (ref 3.5–5.0)
Alkaline Phosphatase: 70 U/L (ref 38–126)
Anion gap: 7 (ref 5–15)
BUN: 9 mg/dL (ref 6–20)
CO2: 28 mmol/L (ref 22–32)
Calcium: 9.1 mg/dL (ref 8.9–10.3)
Chloride: 102 mmol/L (ref 98–111)
Creatinine, Ser: 0.76 mg/dL (ref 0.44–1.00)
GFR, Estimated: >60 mL/min (ref >60)
Glucose, Bld: 107 mg/dL — ABNORMAL HIGH (ref 70–99)
Potassium: 3.2 mmol/L — ABNORMAL LOW (ref 3.5–5.1)
Sodium: 137 mmol/L (ref 135–145)
Total Bilirubin: 0.6 mg/dL (ref 0.3–1.2)
Total Protein: 7.3 g/dL (ref 6.5–8.1)

## 2021-08-17 LAB — RESP PANEL BY RT-PCR (FLU A&B, COVID) ARPGX2
Influenza A by PCR: NEGATIVE
Influenza B by PCR: NEGATIVE
SARS Coronavirus 2 by RT PCR: NEGATIVE

## 2021-08-17 LAB — TROPONIN I (HIGH SENSITIVITY)
Troponin I (High Sensitivity): 8 ng/L (ref <18)
Troponin I (High Sensitivity): 8 ng/L (ref <18)

## 2021-08-17 NOTE — ED Triage Notes (Signed)
Pt to ED via [facility], pt states she started to have chest pain early this morning, pain has been constant and achy, pt was given 324 asa and 3 SL nitro with ems with no relief. Pt does not have cardiac hx but has family hx of cardiac issues.

## 2021-08-17 NOTE — ED Triage Notes (Signed)
Pt in via [facility] from home with c/o CP. Pt awoke at 0200 this am with some left sided CP followed by [facility]. Pt reports pain is pressure like and 7/10 and has been constant all day. Pt with #20g to right hand. Pt was given 324mg  of asa and 3 sublingual nitro. 127/80

## 2021-08-17 NOTE — ED Provider Notes (Signed)
°  Emergency Medicine Provider Triage Evaluation Note  Savannah Mcneil , a 55 y.o.female,  was evaluated in triage.  Pt complains of chest pain.  Patient states that she started having chest pain earlier this morning.  Describes pain as achy, constant, 7/10, no radiation.  Endorses a significant family history of cardiac issues.  Denies abdominal pain, back pain, urinary symptoms, or numbness/tingling in upper or lower extremities.   Review of Systems  Positive: Chest pain, shortness of breath Negative: Denies fever, abdominal pain vomiting  Physical Exam   Vitals:   08/17/21 1907  BP: (!) 142/86  Pulse: 77  Resp: 18  Temp: 98.9 F (37.2 C)  SpO2: 94%   Gen:   Awake, no distress   Resp:  Normal effort  MSK:   Moves extremities without difficulty  Other:    Medical Decision Making  Given the patient's initial medical screening exam, the following diagnostic evaluation has been ordered. The patient will be placed in the appropriate treatment space, once one is available, to complete the evaluation and treatment. I have discussed the plan of care with the patient and I have advised the patient that an ED physician or mid-level practitioner will reevaluate their condition after the test results have been received, as the results may give them additional insight into the type of treatment they may need.    Diagnostics: Labs, EKG, CXR, respiratory panel  Treatments: none immediately   Varney Daily, PA 08/17/21 1909    Gilles Chiquito, MD 08/17/21 [phone]

## 2021-08-18 ENCOUNTER — Emergency Department
Admission: EM | Admit: 2021-08-18 | Discharge: 2021-08-18 | Disposition: A | Discharge status: Home / Self Care | Payer: 59 | Attending: Emergency Medicine | Admitting: Emergency Medicine

## 2021-08-18 DIAGNOSIS — R112 Nausea with vomiting, unspecified: Secondary | ICD-10-CM

## 2021-08-18 DIAGNOSIS — R079 Chest pain, unspecified: Secondary | ICD-10-CM

## 2021-08-18 HISTORY — DX: Essential (primary) hypertension: I10

## 2021-08-18 MED ORDER — ONDANSETRON 4 MG PO TBDP
8.0000 mg | ORAL_TABLET | Freq: Once | ORAL | Status: AC
  Administered 2021-08-18: 01:00:00 8 mg via ORAL
  Filled 2021-08-18: qty 2

## 2021-08-18 MED ORDER — ONDANSETRON 8 MG PO TBDP: 8.0000 mg | ORAL_TABLET | Freq: Three times a day (TID) | ORAL | 0 refills | Status: DC | PRN

## 2021-08-18 NOTE — ED Provider Notes (Signed)
[facility] Emergency Department Provider Note   ____________________________________________   Event Date/Time   First MD Initiated Contact with Patient 08/17/21 1902     (approximate)  I have reviewed the triage vital signs and the nursing notes.   HISTORY  Chief Complaint Chest Pain    HPI Savannah Mcneil is a 55 y.o. female who presents for chest pain after her nausea and vomiting earlier today  LOCATION: Lower substernal chest/midepigastric region DURATION: Began this morning TIMING: Improved since onset SEVERITY: Moderate QUALITY: Burning/pressure CONTEXT: Patient states that when she woke this morning she had multiple episodes of nausea/vomiting including retching with only bile and since that time has had associated mid epigastric/lower substernal burning/pressure that is not related to exertion. MODIFYING FACTORS: Patient states that any p.o. intake worsens her nausea and vomiting is partially relieved at rest ASSOCIATED SYMPTOMS: Nausea/vomiting   Per medical record review, patient has history of asthma/hypertension and a family history of early cardiac disease          Past Medical History:  Diagnosis Date   Asthma    Hypertension     Patient Active Problem List   Diagnosis Date Noted   Primary osteoarthritis of both hips 07/17/2021   Patellofemoral pain syndrome of both knees 07/17/2021   H/O colectomy 12/12/2020   Arthritis 12/12/2020   Diverticulosis 12/12/2020   Herniated nucleus pulposus, L5-S1 02/04/2016   Asthma 11/02/2008    History reviewed. No pertinent surgical history.  Prior to Admission medications   Medication Sig Start Date End Date Taking? Authorizing Provider  ondansetron (ZOFRAN-ODT) 8 MG disintegrating tablet Take 1 tablet (8 mg total) by mouth every 8 (eight) hours as needed for nausea or vomiting. 08/18/21  Yes [place] Gipe, Clent Jacks, MD  acetaminophen-codeine (TYLENOL #3) 300-30 MG tablet Take 1 tablet  by mouth at bedtime as needed for moderate pain. 08/05/21   Kirsteins, Victorino Sparrow, MD  amoxicillin (AMOXIL) 875 MG tablet Take 1 tablet (875 mg total) by mouth 2 (two) times daily. Patient not taking: Reported on 06/06/2021 12/15/19   Tommi Rumps, PA-C  APO-VARENICLINE 1 MG tablet Take 1 mg by mouth 2 (two) times daily. 05/28/21   [provider]  baclofen (LIORESAL) 10 MG tablet Take 0.5 tablets (5 mg total) by mouth 2 (two) times daily as needed for muscle spasms. 07/17/21   Myra Rude, MD  calcium-vitamin D (OSCAL WITH D) 500-5 MG-MCG tablet Take 1 tablet by mouth.    [provider]  gabapentin (NEURONTIN) 400 MG capsule Take 400 mg by mouth 3 (three) times daily. 05/27/21   [provider]  hydrochlorothiazide (HYDRODIURIL) 12.5 MG tablet Take 12.5 mg by mouth daily. 05/28/21   [provider]  ibuprofen (ADVIL) 800 MG tablet Take 1 tablet (800 mg total) by mouth 3 (three) times daily. 11/08/20   Mannie Stabile, PA-C  methocarbamol (ROBAXIN) 500 MG tablet Take 1 tablet (500 mg total) by mouth 2 (two) times daily. Patient not taking: Reported on 06/06/2021 02/02/21   Muthersbaugh, Dahlia Client, PA-C  methylPREDNISolone (MEDROL DOSEPAK) 4 MG TBPK tablet Taper over 6 days Patient not taking: Reported on 06/06/2021 12/26/20   Eber Hong, MD  omeprazole (PRILOSEC) 40 MG capsule Take 40 mg by mouth daily. 05/27/21   [provider]  traZODone (DESYREL) 100 MG tablet Take 100 mg by mouth at bedtime. 05/27/21   [provider]    Allergies Aspirin and Naproxen  No family history on file.  Social  History    Review of Systems Constitutional: No fever/chills Eyes: No visual changes. ENT: No sore throat. Cardiovascular: Endorses chest pain. Respiratory: Denies shortness of breath. Gastrointestinal: Endorses midepigastric abdominal pain.  Endorses nausea/vomiting.  No diarrhea. Genitourinary: Negative for dysuria. Musculoskeletal: Negative  for acute arthralgias Skin: Negative for rash. Neurological: Negative for headaches, weakness/numbness/paresthesias in any extremity Psychiatric: Negative for suicidal ideation/homicidal ideation   ____________________________________________   PHYSICAL EXAM:  VITAL SIGNS: ED Triage Vitals  Enc Vitals Group     BP 08/17/21 1907 (!) 142/86     Pulse Rate 08/17/21 1907 77     Resp 08/17/21 1907 18     Temp 08/17/21 1907 98.9 F (37.2 C)     Temp Source 08/17/21 1907 Oral     SpO2 08/17/21 1907 94 %     Weight 08/17/21 1905 238 lb (108 kg)     Height 08/17/21 1905 5\' 5"  (1.651 m)     Head Circumference --      Peak Flow --      Pain Score 08/17/21 1905 8     Pain Loc --      Pain Edu? --      Excl. in GC? --    Constitutional: Alert and oriented. Well appearing middle-aged overweight Caucasian female sitting in wheelchair in no acute distress. Eyes: Conjunctivae are normal. PERRL. Head: Atraumatic. Nose: No congestion/rhinnorhea. Mouth/Throat: Mucous membranes are moist. Neck: No stridor Cardiovascular: Grossly normal heart sounds.  Good peripheral circulation. Respiratory: Normal respiratory effort.  No retractions. Gastrointestinal: Soft and nontender. No distention. Musculoskeletal: No obvious deformities Neurologic:  Normal speech and language. No gross focal neurologic deficits are appreciated. Skin:  Skin is warm and dry. No rash noted. Psychiatric: Mood and affect are normal. Speech and behavior are normal.  ____________________________________________   LABS (all labs ordered are listed, but only abnormal results are displayed)  Labs Reviewed  COMPREHENSIVE METABOLIC PANEL - Abnormal; Notable for the following components:      Result Value   Potassium 3.2 (*)    Glucose, Bld 107 (*)    All other components within normal limits  RESP PANEL BY RT-PCR (FLU A&B, COVID) ARPGX2  CBC WITH DIFFERENTIAL/PLATELET  TROPONIN I (HIGH SENSITIVITY)  TROPONIN I (HIGH  SENSITIVITY)   ____________________________________________  EKG  ED ECG REPORT I, 08/19/21, the attending physician, personally viewed and interpreted this ECG.  Date: 08/18/2021 EKG Time: 1911 Rate: 79 Rhythm: normal sinus rhythm QRS Axis: normal Intervals: normal ST/T Wave abnormalities: normal Narrative Interpretation: no evidence of acute ischemia  ____________________________________________  RADIOLOGY  ED MD interpretation: One-view portable chest x-ray shows no evidence of acute abnormalities including no pneumonia, pneumothorax, or widened mediastinum  Official radiology report(s): DG Chest 1 View  Result Date: 08/17/2021 CLINICAL DATA:  Chest pain. EXAM: CHEST  1 VIEW COMPARISON:  None. FINDINGS: The cardiomediastinal contours are within normal limits. The lungs are clear. No pneumothorax or pleural effusion. No acute finding in the visualized skeleton. IMPRESSION: No acute cardiopulmonary finding. Electronically Signed   By: 08/19/2021 M.D.   On: 08/17/2021 19:56    ____________________________________________   PROCEDURES  Procedure(s) performed (including Critical Care):  Procedures   ____________________________________________   INITIAL IMPRESSION / ASSESSMENT AND PLAN / ED COURSE  As part of my medical decision making, I reviewed the following data within the electronic medical record, if available:  Nursing notes reviewed and incorporated, Labs reviewed, EKG interpreted, Old chart reviewed, Radiograph reviewed and Notes from prior  ED visits reviewed and incorporated      Patient presents for acute nausea/vomiting The cause of the patients symptoms is not clear, but the patient is overall well appearing and is suspected to have a transient course of illness.  Given History and Exam there does not appear to be an emergent cause of the symptoms such as small bowel obstruction, coronary syndrome, bowel ischemia, DKA, pancreatitis,  appendicitis, other acute abdomen or other emergent problem.  Troponin negative x2, chest x-ray showing no significant abnormalities, and EKG that is nonischemic.  His chest pain is likely from nausea/vomiting however will provide patient with follow-up with our cardiology colleagues for further evaluation and management.  Reassessment: After treatment, the patient is feeling much better, tolerating PO fluids, and shows no signs of dehydration.   Disposition: Discharge home with prompt primary care physician follow up in the next 48 hours. Strict return precautions discussed.      ____________________________________________   FINAL CLINICAL IMPRESSION(S) / ED DIAGNOSES  Final diagnoses:  Chest pain, unspecified type  Nausea and vomiting, unspecified vomiting type     ED Discharge Orders          Ordered    ondansetron (ZOFRAN-ODT) 8 MG disintegrating tablet  Every 8 hours PRN        08/18/21 0101             Note:  This document was prepared using Dragon voice recognition software and may include unintentional dictation errors.    Merwyn Katos, MD 08/18/21 [phone]

## 2021-09-23 DIAGNOSIS — M1612 Unilateral primary osteoarthritis, left hip: Secondary | ICD-10-CM | POA: Diagnosis present

## 2021-09-23 NOTE — Progress Notes (Signed)
Sent message, via epic in basket, requesting orders in epic from surgeon.  

## 2021-10-02 NOTE — Patient Instructions (Addendum)
DUE TO COVID-19 ONLY ONE VISITOR IS ALLOWED TO COME WITH YOU AND STAY IN THE WAITING ROOM ONLY DURING PRE OP AND PROCEDURE.   **NO VISITORS ARE ALLOWED IN THE SHORT STAY AREA OR RECOVERY ROOM!!**       Your procedure is scheduled on: 10/14/21   Report to [facility] Main Entrance    Report to admitting at 7:45 AM   Call this number if you have problems the morning of surgery [phone]   Do not eat food :After Midnight.   May have liquids until 7:25 AM day of surgery  CLEAR LIQUID DIET  Foods Allowed                                                                     Foods Excluded  Water, Black Coffee and tea, regular and decaf                             liquids that you cannot  Plain Jell-O in any flavor  (No red)                                           see through such as: Fruit ices (not with fruit pulp)                                     milk, soups, orange juice              Iced Popsicles (No red)                                    All solid food                                   Apple juices Sports drinks like Gatorade (No red) Lightly seasoned clear broth or consume(fat free) Sugar    The day of surgery:  Drink ONE (1) Pre-Surgery Clear Ensure at 7:25 AM the morning of surgery. Drink in one sitting. Do not sip.  This drink was given to you during your hospital  pre-op appointment visit. Nothing else to drink after completing the  Pre-Surgery Clear Ensure.          If you have questions, please contact your surgeons office.  FOLLOW BOWEL PREP AND ANY ADDITIONAL PRE OP INSTRUCTIONS YOU RECEIVED FROM YOUR SURGEON'S OFFICE!!!     Oral Hygiene is also important to reduce your risk of infection.                                    Remember - BRUSH YOUR TEETH THE MORNING OF SURGERY WITH YOUR REGULAR TOOTHPASTE   Stop all vitamins ans supplements 7 days before surgery   Take these medicines the morning of surgery with A SIP OF WATER:  Inhalers, Gabapentin,  Omeprazole, Zofran, Norco                              You may not have any metal on your body including hair pins, jewelry, and body piercing             Do not wear make-up, lotions, powders, perfumes, or deodorant  Do not wear nail polish including gel and S&S, artificial/acrylic nails, or any other type of covering on natural nails including finger and toenails. If you have artificial nails, gel coating, etc. that needs to be removed by a nail salon please have this removed prior to surgery or surgery may need to be canceled/ delayed if the surgeon/ anesthesia feels like they are unable to be safely monitored.   Do not shave  48 hours prior to surgery.    Do not bring valuables to the hospital. [facility] IS NOT             RESPONSIBLE   FOR VALUABLES.   Contacts, dentures or bridgework may not be worn into surgery.    Patients discharged on the day of surgery will not be allowed to drive home.  Someone needs to stay with you for the first 24 hours after anesthesia.   Special Instructions: Bring a copy of your healthcare power of attorney and living will documents         the day of surgery if you haven't scanned them before.              Please read over the following fact sheets you were given: IF YOU HAVE QUESTIONS ABOUT YOUR PRE-OP INSTRUCTIONS PLEASE CALL [phone]- [facility]     [place] - Preparing for Surgery Before surgery, you can play an important role.  Because skin is not sterile, your skin needs to be as free of germs as possible.  You can reduce the number of germs on your skin by washing with CHG (chlorahexidine gluconate) soap before surgery.  CHG is an antiseptic cleaner which kills germs and bonds with the skin to continue killing germs even after washing. Please DO NOT use if you have an allergy to CHG or antibacterial soaps.  If your skin becomes reddened/irritated stop using the CHG and inform your nurse when you arrive at Short Stay. Do not shave (including legs  and underarms) for at least 48 hours prior to the first CHG shower.  You may shave your face/neck.  Please follow these instructions carefully:  1.  Shower with CHG Soap the night before surgery and the  morning of surgery.  2.  If you choose to wash your hair, wash your hair first as usual with your normal  shampoo.  3.  After you shampoo, rinse your hair and body thoroughly to remove the shampoo.                             4.  Use CHG as you would any other liquid soap.  You can apply chg directly to the skin and wash.  Gently with a scrungie or clean washcloth.  5.  Apply the CHG Soap to your body ONLY FROM THE NECK DOWN.   Do   not use on face/ open  Wound or open sores. Avoid contact with eyes, ears mouth and   genitals (private parts).                       Wash face,  Genitals (private parts) with your normal soap.             6.  Wash thoroughly, paying special attention to the area where your    surgery  will be performed.  7.  Thoroughly rinse your body with warm water from the neck down.  8.  DO NOT shower/wash with your normal soap after using and rinsing off the CHG Soap.                9.  Pat yourself dry with a clean towel.            10.  Wear clean pajamas.            11.  Place clean sheets on your bed the night of your first shower and do not  sleep with pets. Day of Surgery : Do not apply any lotions/deodorants the morning of surgery.  Please wear clean clothes to the hospital/surgery center.  FAILURE TO FOLLOW THESE INSTRUCTIONS MAY RESULT IN THE CANCELLATION OF YOUR SURGERY  PATIENT SIGNATURE_________________________________  NURSE SIGNATURE__________________________________  ________________________________________________________________________   Savannah Mcneil  An incentive spirometer is a tool that can help keep your lungs clear and active. This tool measures how well you are filling your lungs with each breath. Taking long deep  breaths may help reverse or decrease the chance of developing breathing (pulmonary) problems (especially infection) following: A long period of time when you are unable to move or be active. BEFORE THE PROCEDURE  If the spirometer includes an indicator to show your best effort, your nurse or respiratory therapist will set it to a desired goal. If possible, sit up straight or lean slightly forward. Try not to slouch. Hold the incentive spirometer in an upright position. INSTRUCTIONS FOR USE  Sit on the edge of your bed if possible, or sit up as far as you can in bed or on a chair. Hold the incentive spirometer in an upright position. Breathe out normally. Place the mouthpiece in your mouth and seal your lips tightly around it. Breathe in slowly and as deeply as possible, raising the piston or the ball toward the top of the column. Hold your breath for 3-5 seconds or for as long as possible. Allow the piston or ball to fall to the bottom of the column. Remove the mouthpiece from your mouth and breathe out normally. Rest for a few seconds and repeat Steps 1 through 7 at least 10 times every 1-2 hours when you are awake. Take your time and take a few normal breaths between deep breaths. The spirometer may include an indicator to show your best effort. Use the indicator as a goal to work toward during each repetition. After each set of 10 deep breaths, practice coughing to be sure your lungs are clear. If you have an incision (the cut made at the time of surgery), support your incision when coughing by placing a pillow or rolled up towels firmly against it. Once you are able to get out of bed, walk around indoors and cough well. You may stop using the incentive spirometer when instructed by your caregiver.  RISKS AND COMPLICATIONS Take your time so you do not get dizzy or light-headed. If you are in pain, you may  need to take or ask for pain medication before doing incentive spirometry. It is harder  to take a deep breath if you are having pain. AFTER USE Rest and breathe slowly and easily. It can be helpful to keep track of a log of your progress. Your caregiver can provide you with a simple table to help with this. If you are using the spirometer at home, follow these instructions: SEEK MEDICAL CARE IF:  You are having difficultly using the spirometer. You have trouble using the spirometer as often as instructed. Your pain medication is not giving enough relief while using the spirometer. You develop fever of 100.5 F (38.1 C) or higher. SEEK IMMEDIATE MEDICAL CARE IF:  You cough up bloody sputum that had not been present before. You develop fever of 102 F (38.9 C) or greater. You develop worsening pain at or near the incision site. MAKE SURE YOU:  Understand these instructions. Will watch your condition. Will get help right away if you are not doing well or get worse. Document Released: 12/28/2006 Document Revised: 11/09/2011 Document Reviewed: 02/28/2007 [facility] Patient Information 2014 [place], [place].   ________________________________________________________________________

## 2021-10-02 NOTE — Progress Notes (Addendum)
COVID swab appointment: n/a  COVID Vaccine Completed: no Date COVID Vaccine completed: Has received booster: COVID vaccine manufacturer: [employer]   Date of COVID positive in last 90 days: no  PCP - Sarrin List, FNP Cardiologist - n/a  Chest x-ray - 08/17/21 Epic EKG - 08/18/21 Epic Stress Test - 2015 ECHO - 2015 Cardiac Cath - n/a Pacemaker/ICD device last checked: n/a Spinal Cord Stimulator: n/a  Bowel Prep - n/a  Sleep Study - n/a CPAP -   Fasting Blood Sugar - n/a Checks Blood Sugar _____ times a day  Blood Thinner Instructions: n/a Aspirin Instructions: Last Dose:  Activity level: Can go up a flight of stairs and perform activities of daily living without stopping and without symptoms of chest pain. SOB, pt has asthma     Anesthesia review: hospitalized from CP r/o bronchitis in Dec '22, asthma, HTN  Patient denies shortness of breath, fever, cough and chest pain at PAT appointment   Patient verbalized understanding of instructions that were given to them at the PAT appointment. Patient was also instructed that they will need to review over the PAT instructions again at home before surgery.

## 2021-10-03 ENCOUNTER — Encounter (HOSPITAL_COMMUNITY): Payer: Self-pay

## 2021-10-03 ENCOUNTER — Other Ambulatory Visit: Payer: Self-pay

## 2021-10-03 ENCOUNTER — Encounter (HOSPITAL_COMMUNITY)
Admission: RE | Admit: 2021-10-03 | Discharge: 2021-10-03 | Disposition: A | Discharge status: Home / Self Care | Payer: 59 | Source: Ambulatory Visit | Attending: Orthopedic Surgery | Admitting: Orthopedic Surgery

## 2021-10-03 VITALS — BP 152/98 | HR 65 | Temp 98.0°F | Resp 12 | Ht 65.25 in | Wt 243.0 lb

## 2021-10-03 DIAGNOSIS — Z01818 Encounter for other preprocedural examination: Secondary | ICD-10-CM

## 2021-10-03 DIAGNOSIS — I2510 Atherosclerotic heart disease of native coronary artery without angina pectoris: Secondary | ICD-10-CM | POA: Insufficient documentation

## 2021-10-03 DIAGNOSIS — Z01812 Encounter for preprocedural laboratory examination: Secondary | ICD-10-CM | POA: Diagnosis present

## 2021-10-03 HISTORY — DX: Diverticulitis of intestine, part unspecified, without perforation or abscess without bleeding: K57.92

## 2021-10-03 HISTORY — DX: Pneumonia, unspecified organism: J18.9

## 2021-10-03 HISTORY — DX: Unspecified osteoarthritis, unspecified site: M19.90

## 2021-10-03 HISTORY — DX: Headache, unspecified: R51.9

## 2021-10-03 HISTORY — DX: Gastro-esophageal reflux disease without esophagitis: K21.9

## 2021-10-03 LAB — BASIC METABOLIC PANEL
Anion gap: 7 (ref 5–15)
BUN: 15 mg/dL (ref 6–20)
CO2: 29 mmol/L (ref 22–32)
Calcium: 9.3 mg/dL (ref 8.9–10.3)
Chloride: 103 mmol/L (ref 98–111)
Creatinine, Ser: 1.03 mg/dL — ABNORMAL HIGH (ref 0.44–1.00)
GFR, Estimated: >60 mL/min (ref >60)
Glucose, Bld: 92 mg/dL (ref 70–99)
Potassium: 4.7 mmol/L (ref 3.5–5.1)
Sodium: 139 mmol/L (ref 135–145)

## 2021-10-03 LAB — SURGICAL PCR SCREEN
MRSA, PCR: NEGATIVE
Staphylococcus aureus: NEGATIVE

## 2021-10-03 LAB — CBC
HCT: 41.6 % (ref 36.0–46.0)
Hemoglobin: 13.9 g/dL (ref 12.0–15.0)
MCH: 31.9 pg (ref 26.0–34.0)
MCHC: 33.4 g/dL (ref 30.0–36.0)
MCV: 95.4 fL (ref 80.0–100.0)
Platelets: 317 10*3/uL (ref 150–400)
RBC: 4.36 MIL/uL (ref 3.87–5.11)
RDW: 12.8 % (ref 11.5–15.5)
WBC: 7 10*3/uL (ref 4.0–10.5)
nRBC: 0 % (ref 0.0–0.2)

## 2021-10-06 ENCOUNTER — Encounter (HOSPITAL_COMMUNITY): Payer: Self-pay | Admitting: Physician Assistant

## 2021-10-14 ENCOUNTER — Ambulatory Visit (HOSPITAL_COMMUNITY): Admission: RE | Admit: 2021-10-14 | Payer: 59 | Source: Home / Self Care | Admitting: Orthopedic Surgery

## 2021-10-14 ENCOUNTER — Encounter (HOSPITAL_COMMUNITY): Admission: RE | Payer: Self-pay | Source: Home / Self Care

## 2021-10-14 DIAGNOSIS — M1612 Unilateral primary osteoarthritis, left hip: Secondary | ICD-10-CM | POA: Diagnosis present

## 2021-10-14 SURGERY — ARTHROPLASTY, HIP, TOTAL,POSTERIOR APPROACH
Anesthesia: Choice | Site: Hip | Laterality: Left

## 2022-03-16 DIAGNOSIS — M160 Bilateral primary osteoarthritis of hip: Secondary | ICD-10-CM | POA: Diagnosis not present

## 2022-03-16 DIAGNOSIS — R7303 Prediabetes: Secondary | ICD-10-CM | POA: Diagnosis not present

## 2022-03-16 DIAGNOSIS — Z6841 Body Mass Index (BMI) 40.0 and over, adult: Secondary | ICD-10-CM | POA: Diagnosis not present

## 2022-03-17 ENCOUNTER — Encounter: Payer: Self-pay | Admitting: Emergency Medicine

## 2022-03-17 ENCOUNTER — Emergency Department: Payer: 59

## 2022-03-17 ENCOUNTER — Emergency Department
Admission: EM | Admit: 2022-03-17 | Discharge: 2022-03-17 | Disposition: A | Discharge status: Home / Self Care | Payer: 59 | Attending: Emergency Medicine | Admitting: Emergency Medicine

## 2022-03-17 DIAGNOSIS — M25552 Pain in left hip: Secondary | ICD-10-CM | POA: Diagnosis present

## 2022-03-17 DIAGNOSIS — Y9301 Activity, walking, marching and hiking: Secondary | ICD-10-CM | POA: Insufficient documentation

## 2022-03-17 DIAGNOSIS — W010XXA Fall on same level from slipping, tripping and stumbling without subsequent striking against object, initial encounter: Secondary | ICD-10-CM | POA: Diagnosis not present

## 2022-03-17 MED ORDER — OXYCODONE HCL 5 MG PO TABS
5.0000 mg | ORAL_TABLET | Freq: Once | ORAL | Status: AC
  Administered 2022-03-17: 5 mg via ORAL
  Filled 2022-03-17: qty 1

## 2022-03-17 MED ORDER — OXYCODONE HCL 5 MG PO TABS: 5.0000 mg | ORAL_TABLET | Freq: Three times a day (TID) | ORAL | 0 refills | Status: DC | PRN

## 2022-03-17 NOTE — ED Triage Notes (Signed)
Pt presents via POV with complaints of bilateral hip pain following a fall tonight down stairs. Pt states that she walks with a walker and tripped and landed on her left hip. Pt took her prescribed muscle relaxer and motrin PTA without improvement. Denies LOC - not on thinners.

## 2022-03-17 NOTE — Discharge Instructions (Addendum)
Your x-ray shows your arthritis but no fracture. Please follow up with orthopedics.  Please return for any new, worsening, or change in symptoms or other concerns.  Please remember that you cannot drive, operate heavy machinery, or perform any tests or require concentration while taking the oxycodone.

## 2022-03-17 NOTE — ED Provider Triage Note (Signed)
Emergency Medicine Provider Triage Evaluation Note  Savannah Mcneil , a 56 y.o. female  was evaluated in triage.  Pt complains of acute on chronic left hip pain.  Patient presents from home with complaints of bilateral hip pain after a fall tonight.  She was walking with her walker, when she tripped and landed on her left hip.  She took her prescribed muscle relaxants and Motrin prior to arrival.  She denies any head injury or LOC.  Review of Systems  Positive: Left hip pain  Negative: LIC  Physical Exam  BP 196/22   Pulse 83   Temp 98 F (36.7 C) (Oral)   Resp 18   Ht 5' 5.25" (1.657 m)   Wt 113 kg   SpO2 98%   BMI 41.14 kg/m  Gen:   Awake, no distress   Resp:  Normal effort  MSK:   Moves extremities without difficulty  Other:    Medical Decision Making  Medically screening exam initiated at 7:51 PM.  Appropriate orders placed.  Hanifah Royse was informed that the remainder of the evaluation will be completed by another provider, this initial triage assessment does not replace that evaluation, and the importance of remaining in the ED until their evaluation is complete.  Patient to the ED with acute on chronic left hip pain.  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   Lissa Hoard, PA-C 03/17/22 1952

## 2022-03-17 NOTE — ED Notes (Signed)
E signature pad not working. Pt educated on discharge instructions and verbalized understanding.  

## 2022-03-17 NOTE — ED Provider Notes (Signed)
[facility] Provider Note    Event Date/Time   First MD Initiated Contact with Patient 03/17/22 2055     (approximate)   History   Hip Pain   HPI  Savannah Mcneil is a 56 y.o. female who presents today for evaluation of hip pain.  Patient reports that she tripped while walking with her walker and fell onto her left hip.  She denies head strike or LOC.  She reports that she has chronic hip pain at baseline because she has severe arthritis and needs to lose weight before she can have the surgery.  She has never had injections before.  She has not taken any medications prior to arrival.  She has been able to ambulate since the event.  No other injury sustained.     Physical Exam   Triage Vital Signs: ED Triage Vitals  Enc Vitals Group     BP 03/17/22 1946 124/88     Pulse Rate 03/17/22 1946 83     Resp 03/17/22 1946 18     Temp 03/17/22 1946 98 F (36.7 C)     Temp Source 03/17/22 1946 Oral     SpO2 03/17/22 1946 98 %     Weight 03/17/22 1945 249 lb 1.9 oz (113 kg)     Height 03/17/22 1945 5' 5.25" (1.657 m)     Head Circumference --      Peak Flow --      Pain Score 03/17/22 1945 10     Pain Loc --      Pain Edu? --      Excl. in GC? --     Most recent vital signs: Vitals:   03/17/22 1946 03/17/22 2237  BP: 124/88 129/83  Pulse: 83 88  Resp: 18 18  Temp: 98 F (36.7 C)   SpO2: 98% 99%    Physical Exam Vitals and nursing note reviewed.  Constitutional:      General: Awake and alert. No acute distress.    Appearance: Normal appearance. The patient is obese.  HENT:     Head: Normocephalic and atraumatic.     Mouth: Mucous membranes are moist.  Eyes:     General: PERRL. Normal EOMs        Right eye: No discharge.        Left eye: No discharge.     Conjunctiva/sclera: Conjunctivae normal.  Cardiovascular:     Rate and Rhythm: Normal rate and regular rhythm.     Pulses: Normal pulses.     Heart sounds: Normal heart  sounds Pulmonary:     Effort: Pulmonary effort is normal. No respiratory distress.     Breath sounds: Normal breath sounds.  Abdominal:     Abdomen is soft. There is no abdominal tenderness. No rebound or guarding. No distention. Musculoskeletal:        General: No swelling. Normal range of motion.     Cervical back: Normal range of motion and neck supple.  Pelvis stable.  Able to actively and passively range bilateral hips, knees, ankles.  Negative logroll bilaterally.  No hematoma, ecchymosis, or wounds noted.  No low back tenderness.  Normal distal pulses. Skin:    General: Skin is warm and dry.     Capillary Refill: Capillary refill takes less than 2 seconds.     Findings: No rash.  Neurological:     Mental Status: The patient is awake and alert.      ED Results / Procedures /  Treatments   Labs (all labs ordered are listed, but only abnormal results are displayed) Labs Reviewed - No data to display   EKG     RADIOLOGY I independently reviewed and interpreted imaging and agree with radiologists findings.     PROCEDURES:  Critical Care performed:   Procedures   MEDICATIONS ORDERED IN ED: Medications  oxyCODONE (Oxy IR/ROXICODONE) immediate release tablet 5 mg (5 mg Oral Given 03/17/22 2122)     IMPRESSION / MDM / ASSESSMENT AND PLAN / ED COURSE  I reviewed the triage vital signs and the nursing notes.   Differential diagnosis includes, but is not limited to, fracture, contusion, acute on chronic pain, dislocation.  Patient is awake and alert, hemodynamically stable and afebrile.  She has normal active and passive range of motion of bilateral hips.  Negative logroll bilaterally.  She is able to ambulate.  I recommended follow-up with orthopedist for more temporizing management.  She was treated symptomatically in the emergency department with improvement of her symptoms we discussed return precautions and outpatient follow-up.  Patient was discharged in the care  of her legal guardian.  They requested pain medication to go home with.  Patient has an allergy to NSAIDs and reports that she takes Tylenol daily.  She also has multiple prescriptions for narcotics.  However I did review the [place] PDMP, last narcotic was prescribed in March.  She was given 4 tabs only of oxycodone.  Advised that she cannot drive, operate heavy machinery, or perform any tests or require concentration while taking this medication.  Patient and legal guardian understand and agree with plan.  Discharged in stable condition.  All questions were answered.   Patient's presentation is most consistent with severe exacerbation of chronic illness.      FINAL CLINICAL IMPRESSION(S) / ED DIAGNOSES   Final diagnoses:  Left hip pain     Rx / DC Orders   ED Discharge Orders          Ordered    oxyCODONE (ROXICODONE) 5 MG immediate release tablet  Every 8 hours PRN        03/17/22 2218             Note:  This document was prepared using Dragon voice recognition software and may include unintentional dictation errors.   Keturah Shavers 03/17/22 2242    Dionne Bucy, MD 03/17/22 2318

## 2022-07-15 ENCOUNTER — Other Ambulatory Visit: Payer: Self-pay

## 2022-07-15 ENCOUNTER — Emergency Department: Payer: 59

## 2022-07-15 ENCOUNTER — Emergency Department
Admission: EM | Admit: 2022-07-15 | Discharge: 2022-07-15 | Disposition: A | Discharge status: Home / Self Care | Payer: 59 | Attending: Emergency Medicine | Admitting: Emergency Medicine

## 2022-07-15 DIAGNOSIS — Z20822 Contact with and (suspected) exposure to covid-19: Secondary | ICD-10-CM | POA: Insufficient documentation

## 2022-07-15 DIAGNOSIS — J209 Acute bronchitis, unspecified: Secondary | ICD-10-CM | POA: Diagnosis not present

## 2022-07-15 DIAGNOSIS — R0602 Shortness of breath: Secondary | ICD-10-CM | POA: Diagnosis present

## 2022-07-15 DIAGNOSIS — J45909 Unspecified asthma, uncomplicated: Secondary | ICD-10-CM | POA: Insufficient documentation

## 2022-07-15 LAB — CBC
HCT: 45.4 % (ref 36.0–46.0)
Hemoglobin: 15.4 g/dL — ABNORMAL HIGH (ref 12.0–15.0)
MCH: 30.7 pg (ref 26.0–34.0)
MCHC: 33.9 g/dL (ref 30.0–36.0)
MCV: 90.4 fL (ref 80.0–100.0)
Platelets: 362 10*3/uL (ref 150–400)
RBC: 5.02 MIL/uL (ref 3.87–5.11)
RDW: 13 % (ref 11.5–15.5)
WBC: 7.8 10*3/uL (ref 4.0–10.5)
nRBC: 0 % (ref 0.0–0.2)

## 2022-07-15 LAB — BASIC METABOLIC PANEL
Anion gap: 10 (ref 5–15)
BUN: 8 mg/dL (ref 6–20)
CO2: 23 mmol/L (ref 22–32)
Calcium: 9.4 mg/dL (ref 8.9–10.3)
Chloride: 103 mmol/L (ref 98–111)
Creatinine, Ser: 0.86 mg/dL (ref 0.44–1.00)
GFR, Estimated: >60 mL/min (ref >60)
Glucose, Bld: 101 mg/dL — ABNORMAL HIGH (ref 70–99)
Potassium: 3.7 mmol/L (ref 3.5–5.1)
Sodium: 136 mmol/L (ref 135–145)

## 2022-07-15 LAB — RESP PANEL BY RT-PCR (FLU A&B, COVID) ARPGX2
Influenza A by PCR: NEGATIVE
Influenza B by PCR: NEGATIVE
SARS Coronavirus 2 by RT PCR: NEGATIVE

## 2022-07-15 LAB — TROPONIN I (HIGH SENSITIVITY)
Troponin I (High Sensitivity): 13 ng/L (ref <18)
Troponin I (High Sensitivity): 13 ng/L (ref <18)

## 2022-07-15 LAB — BRAIN NATRIURETIC PEPTIDE: B Natriuretic Peptide: 70.1 pg/mL (ref 0.0–100.0)

## 2022-07-15 MED ORDER — SODIUM CHLORIDE 0.9 % IV BOLUS
500.0000 mL | Freq: Once | INTRAVENOUS | Status: AC
  Administered 2022-07-15: 500 mL via INTRAVENOUS

## 2022-07-15 MED ORDER — ALBUTEROL SULFATE HFA 108 (90 BASE) MCG/ACT IN AERS
2.0000 | INHALATION_SPRAY | RESPIRATORY_TRACT | Status: DC | PRN
  Filled 2022-07-15: qty 6.7

## 2022-07-15 MED ORDER — METHYLPREDNISOLONE SODIUM SUCC 125 MG IJ SOLR
125.0000 mg | Freq: Once | INTRAMUSCULAR | Status: AC
  Administered 2022-07-15: 125 mg via INTRAVENOUS
  Filled 2022-07-15: qty 2

## 2022-07-15 MED ORDER — PREDNISONE 20 MG PO TABS: 60.0000 mg | ORAL_TABLET | Freq: Every day | ORAL | 0 refills | Status: AC

## 2022-07-15 MED ORDER — IPRATROPIUM-ALBUTEROL 0.5-2.5 (3) MG/3ML IN SOLN
RESPIRATORY_TRACT | Status: AC
  Filled 2022-07-15: qty 3

## 2022-07-15 MED ORDER — IPRATROPIUM-ALBUTEROL 0.5-2.5 (3) MG/3ML IN SOLN
3.0000 mL | Freq: Once | RESPIRATORY_TRACT | Status: AC
  Administered 2022-07-15: 3 mL via RESPIRATORY_TRACT

## 2022-07-15 MED ORDER — IPRATROPIUM-ALBUTEROL 0.5-2.5 (3) MG/3ML IN SOLN
3.0000 mL | Freq: Once | RESPIRATORY_TRACT | Status: AC
  Administered 2022-07-15: 3 mL via RESPIRATORY_TRACT
  Filled 2022-07-15: qty 3

## 2022-07-15 MED ORDER — BENZONATATE 100 MG PO CAPS: 100.0000 mg | ORAL_CAPSULE | Freq: Three times a day (TID) | ORAL | 0 refills | Status: AC | PRN

## 2022-07-15 NOTE — ED Triage Notes (Signed)
C/O URI and worsening SOB over the past several days.  C?O painful cough.  Cough productive for clear phlegm.  SOB/ DOE noted

## 2022-07-15 NOTE — ED Provider Notes (Signed)
[facility] Provider Note    Event Date/Time   First MD Initiated Contact with Patient 07/15/22 1849     (approximate)   History   Shortness of Breath   HPI  Savannah Mcneil is a 57 y.o. female with a history of asthma, arthritis, diverticulosis, prediabetes, and obesity (but no known history of COPD) who presents with shortness of breath for the last 3 days, gradual onset, and associated with a cough productive of clear sputum.  The patient denies fever or chills.  She has no chest pain or any vomiting or diarrhea.  She denies any leg swelling.  I reviewed the past medical records.  The patient's most recent outpatient encounter was with a nutrition specialist on 7/31 and previously with family medicine on 7/17 for a preoperative exam for hip replacement.  She has no recent hospital admissions.   Physical Exam   Triage Vital Signs: ED Triage Vitals  Enc Vitals Group     BP 07/15/22 1831 (!) 168/105     Pulse Rate 07/15/22 1831 84     Resp 07/15/22 1831 (!) 25     Temp 07/15/22 1831 97.9 F (36.6 C)     Temp Source 07/15/22 1831 Oral     SpO2 07/15/22 1831 93 %     Weight 07/15/22 1832 249 lb 1.9 oz (113 kg)     Height 07/15/22 1832 5' 5.25" (1.657 m)     Head Circumference --      Peak Flow --      Pain Score 07/15/22 1831 0     Pain Loc --      Pain Edu? --      Excl. in GC? --     Most recent vital signs: Vitals:   07/15/22 1930 07/15/22 2100  BP: 128/84 138/88  Pulse: 71 86  Resp: 18 (!) 22  Temp:  98 F (36.7 C)  SpO2: 97% 96%     General: Awake, no distress.  CV:  Good peripheral perfusion.  Resp:  Increased effort with some accessory muscle use.  Diffuse wheezing bilaterally. Abd:  No distention.  Other:  No peripheral edema.   ED Results / Procedures / Treatments   Labs (all labs ordered are listed, but only abnormal results are displayed) Labs Reviewed  BASIC METABOLIC PANEL - Abnormal; Notable for the following  components:      Result Value   Glucose, Bld 101 (*)    All other components within normal limits  CBC - Abnormal; Notable for the following components:   Hemoglobin 15.4 (*)    All other components within normal limits  RESP PANEL BY RT-PCR (FLU A&B, COVID) ARPGX2  BRAIN NATRIURETIC PEPTIDE  TROPONIN I (HIGH SENSITIVITY)  TROPONIN I (HIGH SENSITIVITY)     EKG  ED ECG REPORT I, Dionne Bucy, the attending physician, personally viewed and interpreted this ECG.  Date: 07/15/2022 EKG Time: 1836 Rate: 87 Rhythm: normal sinus rhythm QRS Axis: normal Intervals: normal ST/T Wave abnormalities: Not effective abnormalities Narrative Interpretation: no evidence of acute ischemia; no significant change when compared to EKG of 08/17/2021    RADIOLOGY  Chest x-ray: I independently viewed and interpreted the images; there is no focal consolidation or edema   PROCEDURES:  Critical Care performed: No  Procedures   MEDICATIONS ORDERED IN ED: Medications  ipratropium-albuterol (DUONEB) 0.5-2.5 (3) MG/3ML nebulizer solution 3 mL (3 mLs Nebulization Given 07/15/22 1850)  sodium chloride 0.9 % bolus 500 mL (0 mLs  Intravenous Stopped 07/15/22 2013)  ipratropium-albuterol (DUONEB) 0.5-2.5 (3) MG/3ML nebulizer solution 3 mL (3 mLs Nebulization Given 07/15/22 1924)  methylPREDNISolone sodium succinate (SOLU-MEDROL) 125 mg/2 mL injection 125 mg (125 mg Intravenous Given 07/15/22 1923)  ipratropium-albuterol (DUONEB) 0.5-2.5 (3) MG/3ML nebulizer solution 3 mL (3 mLs Nebulization Given 07/15/22 1924)     IMPRESSION / MDM / ASSESSMENT AND PLAN / ED COURSE  I reviewed the triage vital signs and the nursing notes.  56 year old female with PMH as noted above presents with shortness of breath over the last several days associated with cough but without fever or chest pain.  Exam reveals diffuse wheezing and increased work of breathing but no hypoxia or acute respiratory  distress.  Differential diagnosis includes, but is not limited to, asthma exacerbation, new onset COPD with exacerbation, pneumonia, COVID-19, other viral etiology, less likely cardiac cause.  We will obtain chest x-ray, lab work-up including BNP to rule out acute CHF, respiratory panel, give bronchodilators, steroid, and reassess.  Patient's presentation is most consistent with acute presentation with potential threat to life or bodily function.  The patient is on the cardiac monitor to evaluate for evidence of arrhythmia and/or significant heart rate changes.  ----------------------------------------- 9:51 PM on 07/15/2022 -----------------------------------------  X-ray shows no evidence of pneumonia.  Work-up is unremarkable with no leukocytosis and normal electrolytes.  Respiratory panel is negative.  Troponins are negative x2.  On reassessment the patient appears much more comfortable with normal respiratory effort.  There is still small amount of wheezing but she states she feels much better.  Oxygenation has remained good her ED stay.  She feels well and would like to go home.  I counseled her on the results of the work-up and plan of care.  I gave her strict return precautions and she expresses understanding.  I prescribed prednisone and Tessalon Perles and confirmed that she has albuterol at home.   FINAL CLINICAL IMPRESSION(S) / ED DIAGNOSES   Final diagnoses:  Acute bronchitis, unspecified organism     Rx / DC Orders   ED Discharge Orders          Ordered    predniSONE (DELTASONE) 20 MG tablet  Daily with breakfast        07/15/22 2150    benzonatate (TESSALON PERLES) 100 MG capsule  3 times daily PRN        07/15/22 2150             Note:  This document was prepared using Dragon voice recognition software and may include unintentional dictation errors.    Dionne Bucy, MD 07/15/22 2152

## 2022-07-15 NOTE — Discharge Instructions (Signed)
Take the prednisone as prescribed starting tomorrow and finish the full 4-day course.  Use your albuterol inhaler every 4-6 hours for the next several days and the Tessalon Perles as needed for cough.  Follow-up with your regular doctor.  Return to the ER for new, worsening, or persistent severe shortness of breath, fever, weakness, or any other new or worsening symptoms that concern you.

## 2022-08-03 IMAGING — CR DG LUMBAR SPINE COMPLETE 4+V
5 series · 5 of 5 positions shown · non-contrast
Comparison: Lumbar radiographs 02/02/2021.  Lumbar CT 11/08/2020.

CLINICAL DATA: 55-year-old female status post fall down steps.

EXAM:
LUMBAR SPINE - COMPLETE 4+ VIEW

[l-spine ap]
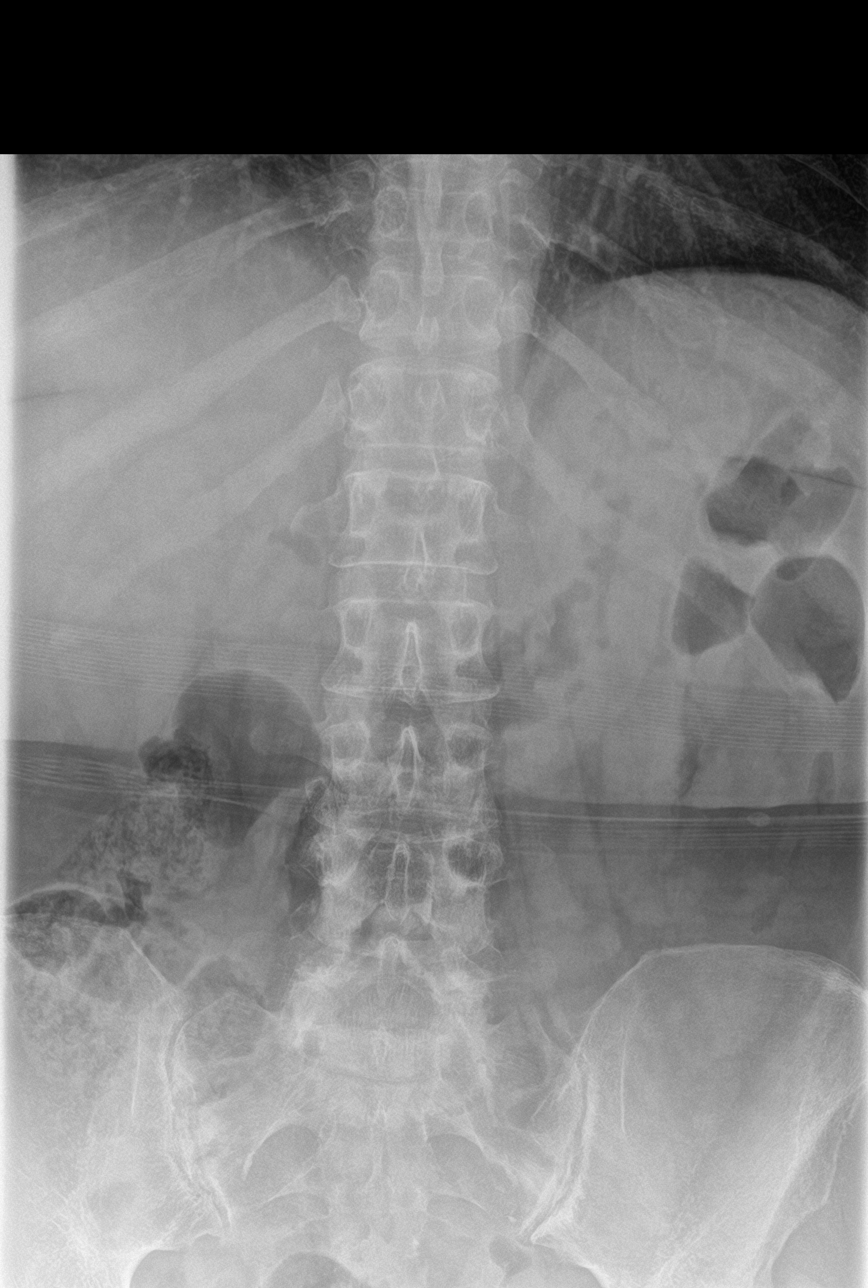

[l-spine obl (1 of 2)]
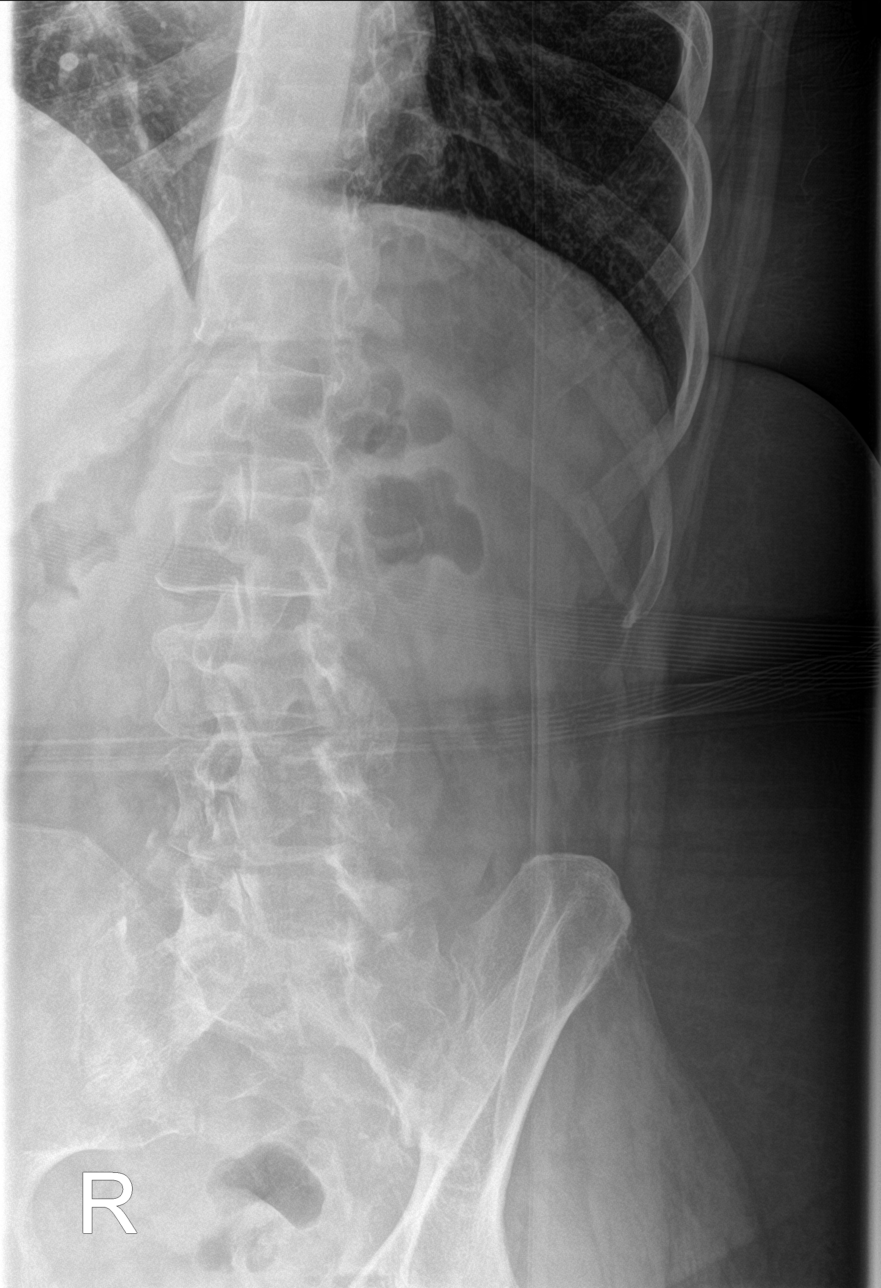

[l-spine obl (2 of 2)]
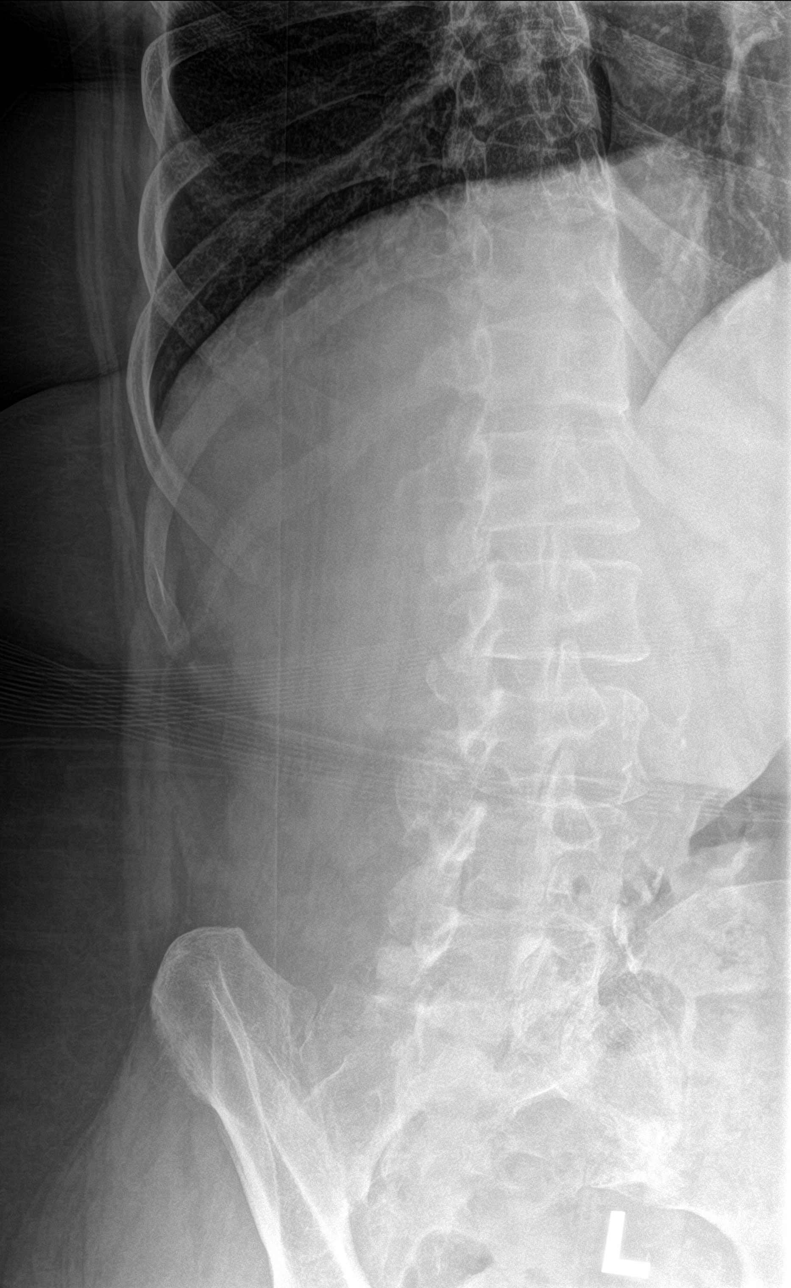

[l-spine lat]
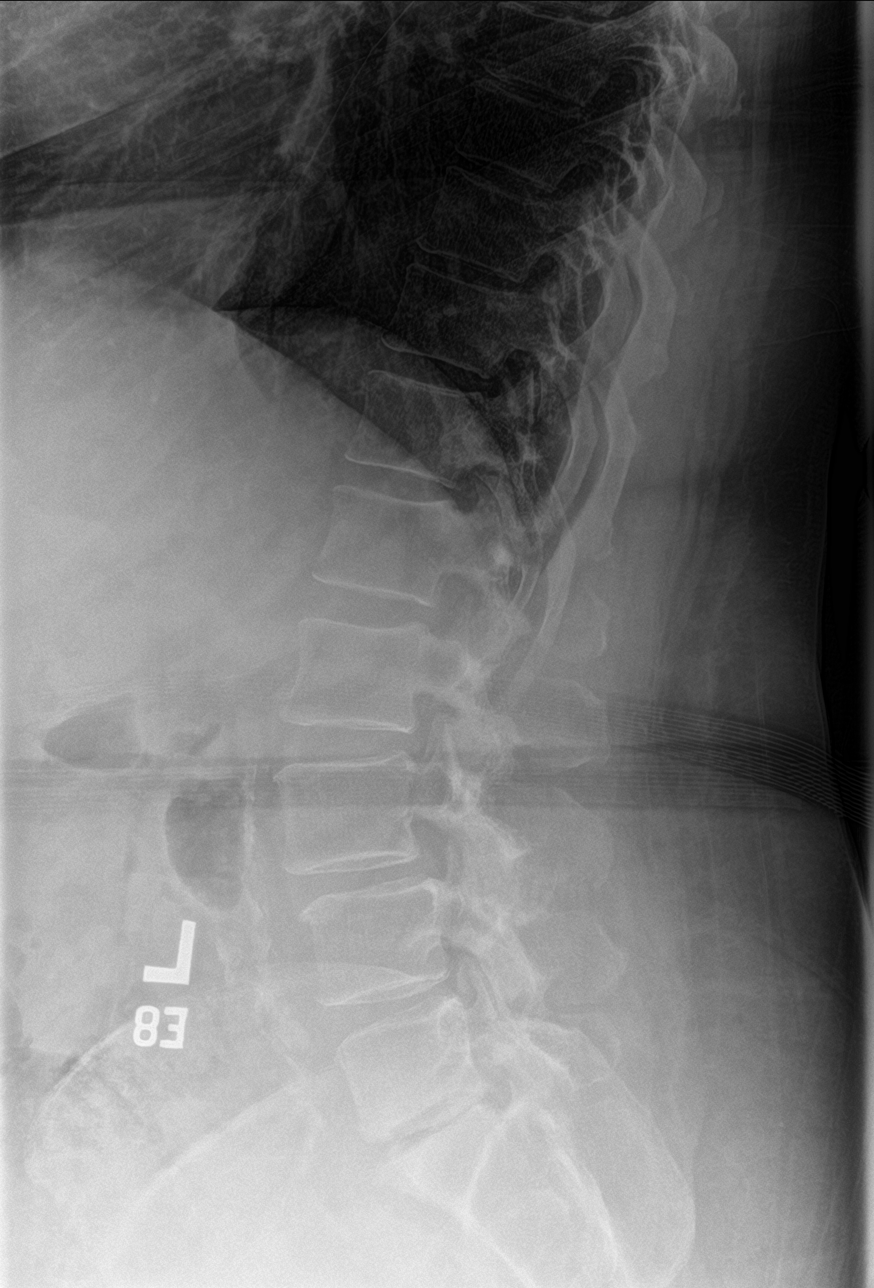

[l-spine spot]
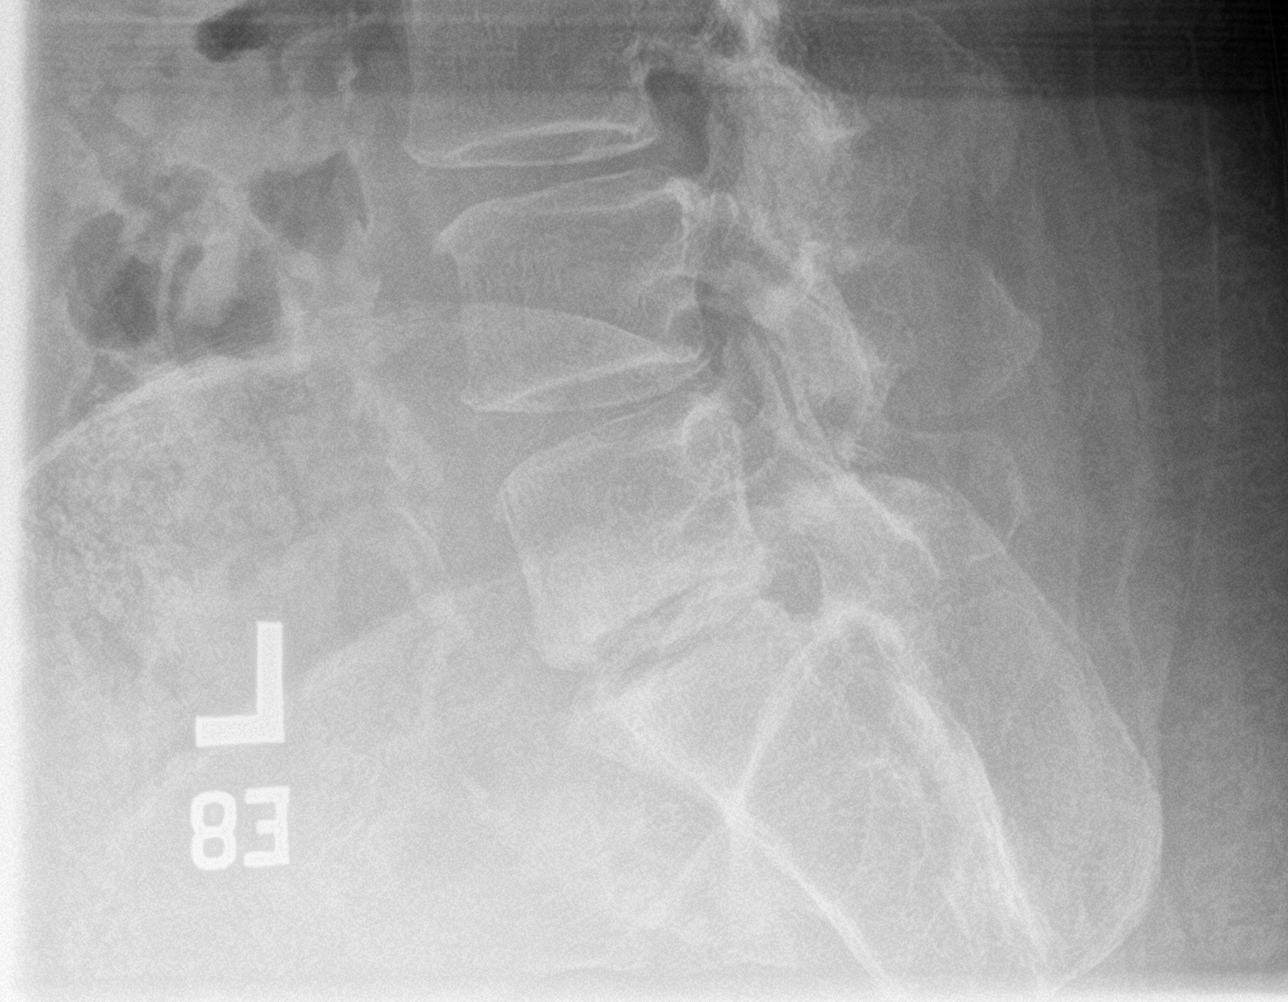

[5 of 5 positions shown; findings below may reference images not displayed]

FINDINGS: Normal lumbar segmentation. Stable vertebral height and alignment.
Mild chronic grade 1 anterolisthesis of L3 on L4. Visible lower
thoracic levels also appears stable. Stable disc spaces, chronic
L5-S1 vacuum disc. Lumbar facet hypertrophy, maximal at L3-L4. No
pars fracture. No acute osseous abnormality identified. Negative
visible lower chest and abdominal visceral contours.
IMPRESSION: No acute osseous abnormality identified in the lumbar spine.

## 2022-09-01 DIAGNOSIS — B372 Candidiasis of skin and nail: Secondary | ICD-10-CM | POA: Diagnosis not present

## 2022-09-01 DIAGNOSIS — Z760 Encounter for issue of repeat prescription: Secondary | ICD-10-CM | POA: Diagnosis not present

## 2022-09-01 DIAGNOSIS — Z09 Encounter for follow-up examination after completed treatment for conditions other than malignant neoplasm: Secondary | ICD-10-CM | POA: Diagnosis not present

## 2022-09-01 DIAGNOSIS — E119 Type 2 diabetes mellitus without complications: Secondary | ICD-10-CM | POA: Diagnosis not present

## 2022-09-01 DIAGNOSIS — I10 Essential (primary) hypertension: Secondary | ICD-10-CM | POA: Diagnosis not present

## 2022-09-01 DIAGNOSIS — K219 Gastro-esophageal reflux disease without esophagitis: Secondary | ICD-10-CM | POA: Diagnosis not present

## 2022-09-01 DIAGNOSIS — Z6841 Body Mass Index (BMI) 40.0 and over, adult: Secondary | ICD-10-CM | POA: Diagnosis not present

## 2022-09-01 DIAGNOSIS — J4520 Mild intermittent asthma, uncomplicated: Secondary | ICD-10-CM | POA: Diagnosis not present

## 2022-09-01 DIAGNOSIS — Z0189 Encounter for other specified special examinations: Secondary | ICD-10-CM | POA: Diagnosis not present

## 2022-09-15 DIAGNOSIS — Z09 Encounter for follow-up examination after completed treatment for conditions other than malignant neoplasm: Secondary | ICD-10-CM | POA: Diagnosis not present

## 2022-09-15 DIAGNOSIS — R062 Wheezing: Secondary | ICD-10-CM | POA: Diagnosis not present

## 2022-09-15 DIAGNOSIS — G4700 Insomnia, unspecified: Secondary | ICD-10-CM | POA: Diagnosis not present

## 2022-09-15 DIAGNOSIS — E782 Mixed hyperlipidemia: Secondary | ICD-10-CM | POA: Diagnosis not present

## 2022-09-15 DIAGNOSIS — I10 Essential (primary) hypertension: Secondary | ICD-10-CM | POA: Diagnosis not present

## 2022-10-14 IMAGING — CR DG CHEST 1V
1 series · 1 of 1 positions shown · non-contrast
Comparison: None.

CLINICAL DATA: Chest pain.

EXAM:
CHEST  1 VIEW

[dg chest 1 view]
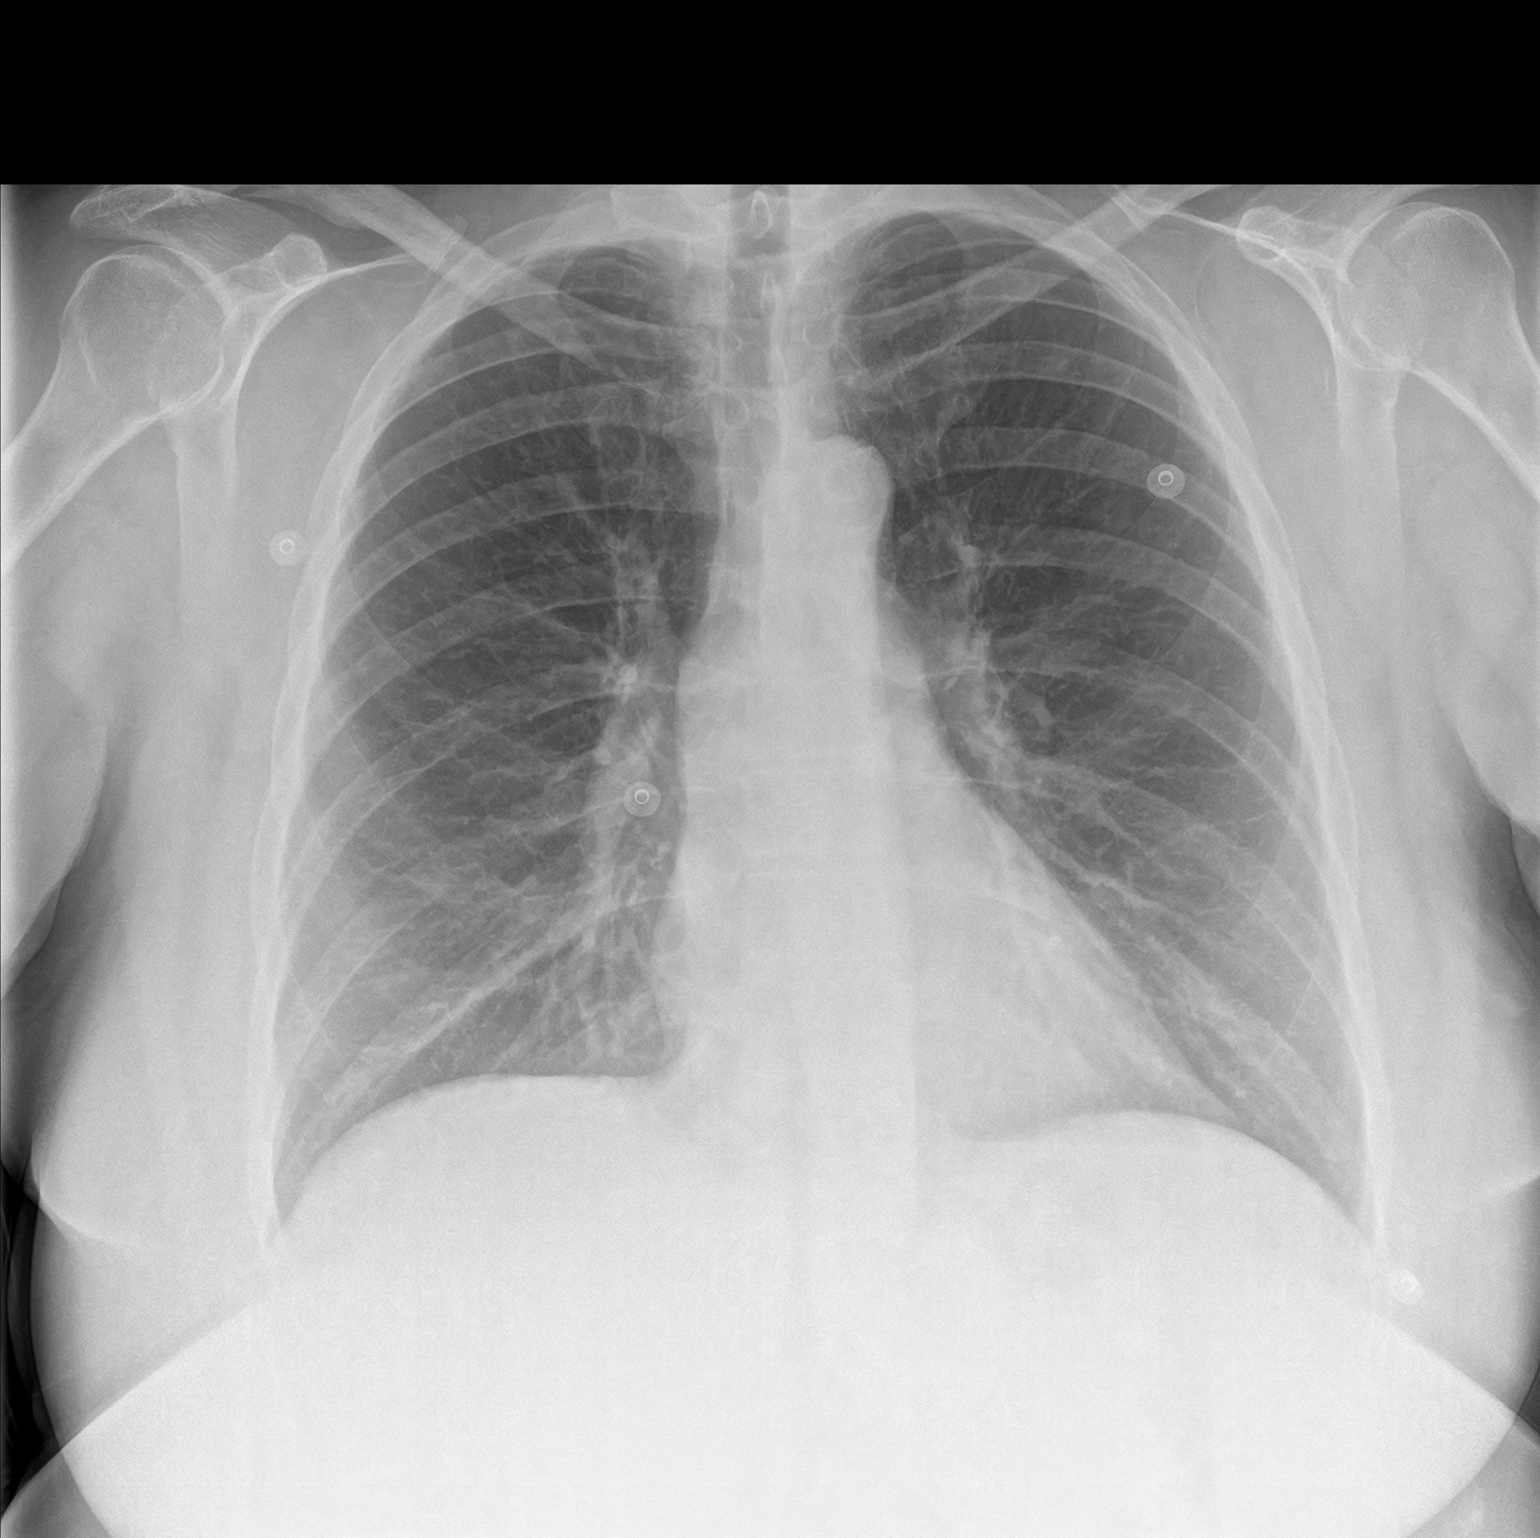

[1 of 1 positions shown; findings below may reference images not displayed]

FINDINGS: The cardiomediastinal contours are within normal limits. The lungs
are clear. No pneumothorax or pleural effusion. No acute finding in
the visualized skeleton.
IMPRESSION: No acute cardiopulmonary finding.

## 2022-12-02 DIAGNOSIS — M62838 Other muscle spasm: Secondary | ICD-10-CM | POA: Diagnosis not present

## 2022-12-02 DIAGNOSIS — M62830 Muscle spasm of back: Secondary | ICD-10-CM | POA: Diagnosis not present

## 2022-12-02 DIAGNOSIS — G4700 Insomnia, unspecified: Secondary | ICD-10-CM | POA: Diagnosis not present

## 2022-12-02 DIAGNOSIS — H908 Mixed conductive and sensorineural hearing loss, unspecified: Secondary | ICD-10-CM | POA: Diagnosis not present

## 2022-12-02 DIAGNOSIS — I10 Essential (primary) hypertension: Secondary | ICD-10-CM | POA: Diagnosis not present

## 2022-12-02 DIAGNOSIS — Z09 Encounter for follow-up examination after completed treatment for conditions other than malignant neoplasm: Secondary | ICD-10-CM | POA: Diagnosis not present

## 2022-12-02 DIAGNOSIS — Z6836 Body mass index (BMI) 36.0-36.9, adult: Secondary | ICD-10-CM | POA: Diagnosis not present

## 2022-12-02 DIAGNOSIS — Z760 Encounter for issue of repeat prescription: Secondary | ICD-10-CM | POA: Diagnosis not present

## 2022-12-14 ENCOUNTER — Encounter: Payer: Self-pay | Admitting: *Deleted

## 2023-07-20 ENCOUNTER — Other Ambulatory Visit: Payer: Self-pay

## 2023-07-20 ENCOUNTER — Encounter: Payer: Self-pay | Admitting: Emergency Medicine

## 2023-07-20 ENCOUNTER — Emergency Department
Admission: EM | Admit: 2023-07-20 | Discharge: 2023-07-20 | Disposition: A | Discharge status: Home / Self Care | Payer: BC Managed Care – PPO | Attending: Emergency Medicine | Admitting: Emergency Medicine

## 2023-07-20 DIAGNOSIS — J45909 Unspecified asthma, uncomplicated: Secondary | ICD-10-CM | POA: Insufficient documentation

## 2023-07-20 DIAGNOSIS — M5432 Sciatica, left side: Secondary | ICD-10-CM | POA: Diagnosis not present

## 2023-07-20 DIAGNOSIS — M5442 Lumbago with sciatica, left side: Secondary | ICD-10-CM | POA: Diagnosis not present

## 2023-07-20 DIAGNOSIS — M1612 Unilateral primary osteoarthritis, left hip: Secondary | ICD-10-CM | POA: Diagnosis not present

## 2023-07-20 DIAGNOSIS — I10 Essential (primary) hypertension: Secondary | ICD-10-CM | POA: Diagnosis not present

## 2023-07-20 DIAGNOSIS — M160 Bilateral primary osteoarthritis of hip: Secondary | ICD-10-CM | POA: Diagnosis not present

## 2023-07-20 DIAGNOSIS — M25552 Pain in left hip: Secondary | ICD-10-CM | POA: Diagnosis present

## 2023-07-20 MED ORDER — HYDROCODONE-ACETAMINOPHEN 5-325 MG PO TABS: 1.0000 | ORAL_TABLET | Freq: Three times a day (TID) | ORAL | 0 refills | Status: AC | PRN

## 2023-07-20 MED ORDER — HYDROCODONE-ACETAMINOPHEN 5-325 MG PO TABS
1.0000 | ORAL_TABLET | Freq: Once | ORAL | Status: AC
  Administered 2023-07-20: 1 via ORAL
  Filled 2023-07-20: qty 1

## 2023-07-20 MED ORDER — DEXAMETHASONE SODIUM PHOSPHATE 10 MG/ML IJ SOLN
10.0000 mg | Freq: Once | INTRAMUSCULAR | Status: AC
  Administered 2023-07-20: 10 mg via INTRAMUSCULAR
  Filled 2023-07-20: qty 1

## 2023-07-20 MED ORDER — GABAPENTIN 300 MG PO CAPS: 300.0000 mg | ORAL_CAPSULE | Freq: Every day | ORAL | 0 refills | Status: DC

## 2023-07-20 MED ORDER — PREDNISONE 20 MG PO TABS: 40.0000 mg | ORAL_TABLET | Freq: Every day | ORAL | 0 refills | Status: AC

## 2023-07-20 NOTE — ED Triage Notes (Signed)
Patient to ED via POV for left hip pain. States pain is radiating down left leg. Suppose to have double hip replacement but pain worse this week. No new injury.  HX of sciatica

## 2023-07-20 NOTE — Discharge Instructions (Signed)
Your exam is consistent with acute on chronic pain related to your sciatica and hip arthritis. Take the anti-inflammatory as directed. Take your previously prescribed muscle relaxant as directed. Increase your Gabapentin to 900 mg (4 tabs) at bedtime. Follow-up with your primary provider for ongoing medical management of your chronic hip pain, until you move forward with expected surgery.

## 2023-07-20 NOTE — ED Notes (Signed)
Patient's chart stated she had a legal guardian. The chart did not have any information on who the legal guardian would be or a phone number provided. Patient informed this RN that she has never had a legal guardian.This RN remove the FYI from patient's chart.

## 2023-07-20 NOTE — ED Provider Notes (Signed)
[facility] Emergency Department Provider Note     Event Date/Time   First MD Initiated Contact with Patient 07/20/23 1741     (approximate)   History   Hip Pain   HPI  Savannah Mcneil is a 57 y.o. female with a history of asthma, HTN, and osteoarthritis of the hip, presents to the ED for evaluation of acute on chronic left hip pain.  Patient presents to the ED noting left hip pain with radicular symptoms.  She has a history of H&P and sciatica as well.  She denies any recent injury, trauma, or fall.  Previously been evaluated for total hip arthroplasty.  According to the patient, she is in the [facility] for expectant bilateral total hip arthroplasty.  Chart review reveals patient has been postponed for surgery due to an elevated BMI.  She reports taking gabapentin for her pain but denies any recent change in the dosing schedule.  She is being followed by primary provider for her chronic pain.  Denies any bladder or bowel incontinence, foot drop, or saddle anesthesias.  Physical Exam   Triage Vital Signs: ED Triage Vitals  Encounter Vitals Group     BP 07/20/23 1516 (!) 144/97     Systolic BP Percentile --      Diastolic BP Percentile --      Pulse Rate 07/20/23 1516 78     Resp 07/20/23 1516 18     Temp 07/20/23 1516 (!) 97.5 F (36.4 C)     Temp Source 07/20/23 1516 Oral     SpO2 07/20/23 1516 97 %     Weight 07/20/23 1517 205 lb (93 kg)     Height 07/20/23 1517 5\' 5"  (1.651 m)     Head Circumference --      Peak Flow --      Pain Score 07/20/23 1517 10     Pain Loc --      Pain Education --      Exclude from Growth Chart --     Most recent vital signs: Vitals:   07/20/23 1516 07/20/23 1916  BP: (!) 144/97 110/83  Pulse: 78 81  Resp: 18 18  Temp: (!) 97.5 F (36.4 C) 98.4 F (36.9 C)  SpO2: 97% 94%    General Awake, no distress. NAD HEENT NCAT. PERRL. EOMI. No rhinorrhea. Mucous membranes are moist.  CV:  Good  peripheral perfusion. RRR RESP:  Normal effort. CTA ABD:  No distention.  MSK:  Normal spinal alignment without midline tenderness, spasm, deformity, or step-off.  Normal hip flexion and extension range on exam. NEURO: Nerves II to XII grossly intact.  Normal LE DTRs bilaterally.   ED Results / Procedures / Treatments   Labs (all labs ordered are listed, but only abnormal results are displayed) Labs Reviewed - No data to display   EKG   RADIOLOGY  No results found.   PROCEDURES:  Critical Care performed: No  Procedures   MEDICATIONS ORDERED IN ED: Medications  dexamethasone (DECADRON) injection 10 mg (10 mg Intramuscular Given 07/20/23 1832)  HYDROcodone-acetaminophen (NORCO/VICODIN) 5-325 MG per tablet 1 tablet (1 tablet Oral Given 07/20/23 1831)     IMPRESSION / MDM / ASSESSMENT AND PLAN / ED COURSE  I reviewed the triage vital signs and the nursing notes.                              Differential diagnosis  includes, but is not limited to, OA, DJD, chronic pain, neuralgias, myalgias,  Patient's presentation is most consistent with acute, uncomplicated illness.  Patient's diagnosis is consistent with acute on chronic left hip pain secondary to OA and sciatica.  Presents in no acute distress with no reports of any new neuro muscle deficits.  No recent injury, trauma, fall.  She is resting pain for her acute on chronic presentation.  Reassuring exam and workup at this time.  No red flags noted on exam.  Patient will be discharged home with prescriptions for gabapentin to increase by 300 mg nightly.  She is also given a prescription for prednisone, and hydrocodone (#9). Patient is to follow up with her primary provider for ongoing medical management as discussed, as needed or otherwise directed. Patient is given ED precautions to return to the ED for any worsening or new symptoms.  FINAL CLINICAL IMPRESSION(S) / ED DIAGNOSES   Final diagnoses:  Sciatica of left side   Primary osteoarthritis of both hips     Rx / DC Orders   ED Discharge Orders          Ordered    gabapentin (NEURONTIN) 300 MG capsule  Daily at bedtime        07/20/23 1851    predniSONE (DELTASONE) 20 MG tablet  Daily with breakfast        07/20/23 1851    HYDROcodone-acetaminophen (NORCO) 5-325 MG tablet  3 times daily PRN        07/20/23 1851             Note:  This document was prepared using Dragon voice recognition software and may include unintentional dictation errors.    Lissa Hoard, PA-C 07/20/23 1934    Trinna Post, MD 07/20/23 2245

## 2023-09-07 DIAGNOSIS — J069 Acute upper respiratory infection, unspecified: Secondary | ICD-10-CM | POA: Diagnosis not present

## 2023-09-07 DIAGNOSIS — I10 Essential (primary) hypertension: Secondary | ICD-10-CM | POA: Diagnosis not present

## 2023-09-07 DIAGNOSIS — Z760 Encounter for issue of repeat prescription: Secondary | ICD-10-CM | POA: Diagnosis not present

## 2023-12-07 ENCOUNTER — Other Ambulatory Visit: Payer: Self-pay | Admitting: Family Medicine

## 2023-12-07 DIAGNOSIS — Z1231 Encounter for screening mammogram for malignant neoplasm of breast: Secondary | ICD-10-CM

## 2023-12-16 ENCOUNTER — Encounter: Payer: Self-pay | Admitting: Internal Medicine

## 2024-01-31 ENCOUNTER — Ambulatory Visit (AMBULATORY_SURGERY_CENTER)

## 2024-01-31 VITALS — Ht 65.25 in | Wt 201.0 lb

## 2024-01-31 DIAGNOSIS — Z1211 Encounter for screening for malignant neoplasm of colon: Secondary | ICD-10-CM

## 2024-01-31 MED ORDER — PEG 3350-KCL-NA BICARB-NACL 420 G PO SOLR: 4000.0000 mL | Freq: Once | ORAL | 0 refills | Status: AC

## 2024-01-31 NOTE — Progress Notes (Signed)
 No egg or soy allergy known to patient  No issues known to pt with past sedation with any surgeries or procedures Patient denies ever being told they had issues or difficulty with intubation  No FH of Malignant Hyperthermia Pt is not on diet pills Pt is not on  home 02  Pt is not on blood thinners  Pt has mild, intermittent  issues with constipation  No A fib or A flutter Have any cardiac testing pending--No Pt can ambulate with walker assistance   Pt denies use of chewing tobacco Discussed diabetic I weight loss medication holds Discussed NSAID holds Checked BMI Pt instructed to use [employer] or GoodRx for a price reduction on prep  Patient's chart reviewed by Rogena Class CNRA prior to previsit and patient appropriate for the [facility].  Pre visit completed and red dot placed by patient's name on their procedure day (on provider's schedule).

## 2024-02-14 ENCOUNTER — Encounter: Admitting: Internal Medicine

## 2024-02-21 ENCOUNTER — Telehealth: Payer: Self-pay | Admitting: Gastroenterology

## 2024-02-21 MED ORDER — PEG 3350-KCL-NA BICARB-NACL 420 G PO SOLR: 4000.0000 mL | Freq: Once | ORAL | 0 refills | Status: AC

## 2024-02-21 NOTE — Telephone Encounter (Signed)
 Patient called and stated that she was not able to receive her prep medication as of yet and was wondering if we can resent her prep medication over to [facility] in [place] on [address]. Good contact number for them is [phone]. Patient is requesting a call to inform her that her prep medication was sent over to her pharmacy. Patient is schedule for a colonoscopy on June the 30 th. Please advise.

## 2024-02-21 NOTE — Telephone Encounter (Signed)
 Will send Golytely to pharmacy and pt will need otc dulcolax

## 2024-02-21 NOTE — Addendum Note (Signed)
 Addended by: Shelley Pooley on: 02/21/2024 01:12 PM   Modules accepted: Orders

## 2024-02-27 NOTE — Progress Notes (Unsigned)
 [facility] History and Physical   Primary Care Physician:  [facility]   Reason for Procedure:  Colon cancer screening  Plan:    Colonoscopy     HPI: Savannah Mcneil is a 58 y.o. female presenting for an initial screening colonoscopy.   Past Medical History:  Diagnosis Date   Arthritis    Asthma    Diverticulitis    GERD (gastroesophageal reflux disease)    Headache    mirgranes   Hypertension    Pneumonia     Past Surgical History:  Procedure Laterality Date   ABLATION     CARPAL TUNNEL RELEASE Right    COLECTOMY     COLONOSCOPY     DILATION AND CURETTAGE OF UTERUS     ESOPHAGOGASTRODUODENOSCOPY      Prior to Admission medications   Medication Sig Start Date End Date Taking? Authorizing Provider  acetaminophen -codeine  (TYLENOL  #3) 300-30 MG tablet Take 1 tablet by mouth at bedtime as needed for moderate pain. Patient not taking: Reported on 09/26/2021 08/05/21   Carilyn Prentice BRAVO, MD  albuterol  (PROVENTIL ) (2.5 MG/3ML) 0.083% nebulizer solution Inhale 2.5 mg into the lungs every 6 (six) hours as needed. 09/15/22   [provider]  albuterol  (VENTOLIN  HFA) 108 (90 Base) MCG/ACT inhaler Inhale 2 puffs into the lungs every 6 (six) hours as needed for wheezing or shortness of breath.    [provider]  cholecalciferol (VITAMIN D3) 25 MCG (1000 UNIT) tablet Take 1,000 Units by mouth daily.    [provider]  Cholecalciferol 1.25 MG (50000 UT) TABS Take 125 mcg by mouth daily.    [provider]  cyclobenzaprine  (FLEXERIL ) 10 MG tablet Take 10 mg by mouth 3 (three) times daily as needed for muscle spasms.    [provider]  gabapentin  (NEURONTIN ) 300 MG capsule Take 1 capsule (300 mg total) by mouth at bedtime. 07/20/23 08/19/23  Menshew, Candida LULLA Kings, PA-C  gabapentin  (NEURONTIN ) 600 MG tablet Take 600 mg by mouth 3 (three) times daily. Patient not taking: Reported on 01/31/2024 05/27/21    [provider]  gabapentin  (NEURONTIN ) 800 MG tablet Take 800 mg by mouth 3 (three) times daily.    [provider]  hydrochlorothiazide (HYDRODIURIL) 12.5 MG tablet Take 12.5 mg by mouth daily. Patient not taking: Reported on 01/31/2024 05/28/21   [provider]  hydrochlorothiazide (HYDRODIURIL) 25 MG tablet Take 1 tablet by mouth daily. 12/06/23   [provider]  HYDROcodone -acetaminophen  (NORCO) 10-325 MG tablet Take 1 tablet by mouth every 6 (six) hours as needed.    [provider]  losartan (COZAAR) 100 MG tablet Take 100 mg by mouth daily.    [provider]  omeprazole (PRILOSEC) 40 MG capsule Take 40 mg by mouth daily. 05/27/21   [provider]  ondansetron  (ZOFRAN -ODT) 8 MG disintegrating tablet Take 1 tablet (8 mg total) by mouth every 8 (eight) hours as needed for nausea or vomiting. Patient not taking: Reported on 01/31/2024 08/18/21   Bradler, Evan K, MD  traZODone (DESYREL) 100 MG tablet Take 100 mg by mouth at bedtime. Patient not taking: Reported on 01/31/2024 05/27/21   [provider]    Current Outpatient Medications  Medication Sig Dispense Refill   acetaminophen -codeine  (TYLENOL  #3) 300-30 MG tablet Take 1 tablet by mouth at bedtime as needed for moderate pain. (Patient not taking: Reported on 09/26/2021) 30 tablet 0   albuterol  (PROVENTIL ) (2.5 MG/3ML) 0.083% nebulizer solution Inhale 2.5 mg  into the lungs every 6 (six) hours as needed.     albuterol  (VENTOLIN  HFA) 108 (90 Base) MCG/ACT inhaler Inhale 2 puffs into the lungs every 6 (six) hours as needed for wheezing or shortness of breath.     cholecalciferol (VITAMIN D3) 25 MCG (1000 UNIT) tablet Take 1,000 Units by mouth daily.     Cholecalciferol 1.25 MG (50000 UT) TABS Take 125 mcg by mouth daily.     cyclobenzaprine  (FLEXERIL ) 10 MG tablet Take 10 mg by mouth 3 (three) times daily as needed for muscle spasms.     gabapentin  (NEURONTIN ) 300 MG capsule  Take 1 capsule (300 mg total) by mouth at bedtime. 30 capsule 0   gabapentin  (NEURONTIN ) 600 MG tablet Take 600 mg by mouth 3 (three) times daily. (Patient not taking: Reported on 01/31/2024)     gabapentin  (NEURONTIN ) 800 MG tablet Take 800 mg by mouth 3 (three) times daily.     hydrochlorothiazide (HYDRODIURIL) 12.5 MG tablet Take 12.5 mg by mouth daily. (Patient not taking: Reported on 01/31/2024)     hydrochlorothiazide (HYDRODIURIL) 25 MG tablet Take 1 tablet by mouth daily.     HYDROcodone -acetaminophen  (NORCO) 10-325 MG tablet Take 1 tablet by mouth every 6 (six) hours as needed.     losartan (COZAAR) 100 MG tablet Take 100 mg by mouth daily.     omeprazole (PRILOSEC) 40 MG capsule Take 40 mg by mouth daily.     ondansetron  (ZOFRAN -ODT) 8 MG disintegrating tablet Take 1 tablet (8 mg total) by mouth every 8 (eight) hours as needed for nausea or vomiting. (Patient not taking: Reported on 01/31/2024) 20 tablet 0   traZODone (DESYREL) 100 MG tablet Take 100 mg by mouth at bedtime. (Patient not taking: Reported on 01/31/2024)     No current facility-administered medications for this visit.    Allergies as of 02/28/2024 - Review Complete 01/31/2024  Allergen Reaction Noted   Naproxen  Nausea And Vomiting 01/28/2020   Aspirin Other (See Comments) 12/15/2019    Family History  Problem Relation Age of Onset   Colon cancer Neg Hx    Rectal cancer Neg Hx    Stomach cancer Neg Hx     Social History   Socioeconomic History   Marital status: Single    Spouse name: Not on file   Number of children: Not on file   Years of education: Not on file   Highest education level: Not on file  Occupational History   Not on file  Tobacco Use   Smoking status: Every Day    Current packs/day: 1.00    Types: Cigarettes   Smokeless tobacco: Never  Vaping Use   Vaping status: Never Used  Substance and Sexual Activity   Alcohol use: Yes    Alcohol/week: 1.0 standard drink of alcohol    Types: 1 Standard  drinks or equivalent per week    Comment: social   Drug use: Never   Sexual activity: Not on file  Other Topics Concern   Not on file  Social History Narrative   Not on file   Social Drivers of Health   Financial Resource Strain: Not on File (12/18/2021)   Received from [employer]    Financial Resource Strain: 0  Food Insecurity: Not at Risk (12/06/2023)   Received from [facility] Insecurity    Within the past 12 months, you worried that your food would run out before you got money to buy more.: 1  Transportation Needs: Not at Risk (12/06/2023)   Received from [facility] An Needs    In the past 12 months, has lack of transportation kept you from medical appointments, meetings, work or from getting things needed for daily living?: 1  Physical Activity: Not on File (12/18/2021)   Received from [facility]   Physical Activity    Physical Activity: 0  Stress: Not on File (12/18/2021)   Received from [facility]   Stress    Stress: 0  Social Connections: Not on File (05/17/2023)   Received from [employer] [facility]   Social Connections    Connectedness: 0  Intimate Partner Violence: Not on file    Review of Systems: Positive for *** All other review of systems negative except as mentioned in the HPI.  Physical Exam: Vital signs There were no vitals taken for this visit.  General:   Alert,  Well-developed, well-nourished, pleasant and cooperative in NAD Lungs:  Clear throughout to auscultation.   Heart:  Regular rate and rhythm; no murmurs, clicks, rubs,  or gallops. Abdomen:  Soft, nontender and nondistended. Normal bowel sounds.   Neuro/Psych:  Alert and cooperative. Normal mood and affect. A and O x 3   @Tanasia Budzinski  CHARLENA Commander, MD, [facility] [phone] (pager) 02/27/2024 7:30 PM@

## 2024-02-28 ENCOUNTER — Ambulatory Visit (AMBULATORY_SURGERY_CENTER): Admitting: Internal Medicine

## 2024-02-28 ENCOUNTER — Encounter: Payer: Self-pay | Admitting: Internal Medicine

## 2024-02-28 VITALS — BP 114/79 | HR 65 | Temp 98.2°F | Resp 12 | Ht 65.25 in | Wt 201.0 lb

## 2024-02-28 DIAGNOSIS — Z1211 Encounter for screening for malignant neoplasm of colon: Secondary | ICD-10-CM | POA: Diagnosis not present

## 2024-02-28 DIAGNOSIS — Z980 Intestinal bypass and anastomosis status: Secondary | ICD-10-CM | POA: Diagnosis not present

## 2024-02-28 MED ORDER — SODIUM CHLORIDE 0.9 % IV SOLN: 500.0000 mL | Freq: Once | INTRAVENOUS | Status: DC

## 2024-02-28 NOTE — Patient Instructions (Addendum)
 No polyps or cancer seen.  Could see where the surgery was done and that is ok.  I appreciate the opportunity to care for you. Lupita CHARLENA Commander, MD, FACG  YOU HAD AN ENDOSCOPIC PROCEDURE TODAY AT THE [facility]:   Refer to the procedure report that was given to you for any specific questions about what was found during the examination.  If the procedure report does not answer your questions, please call your gastroenterologist to clarify.  If you requested that your care partner not be given the details of your procedure findings, then the procedure report has been included in a sealed envelope for you to review at your convenience later.  YOU SHOULD EXPECT: Some feelings of bloating in the abdomen. Passage of more gas than usual.  Walking can help get rid of the air that was put into your GI tract during the procedure and reduce the bloating. If you had a lower endoscopy (such as a colonoscopy or flexible sigmoidoscopy) you may notice spotting of blood in your stool or on the toilet paper. If you underwent a bowel prep for your procedure, you may not have a normal bowel movement for a few days.  Please Note:  You might notice some irritation and congestion in your nose or some drainage.  This is from the oxygen used during your procedure.  There is no need for concern and it should clear up in a day or so.  SYMPTOMS TO REPORT IMMEDIATELY:  Following lower endoscopy (colonoscopy or flexible sigmoidoscopy):  Excessive amounts of blood in the stool  Significant tenderness or worsening of abdominal pains  Swelling of the abdomen that is new, acute  Fever of 100F or higher   For urgent or emergent issues, a gastroenterologist can be reached at any hour by calling [phone]. Do not use MyChart messaging for urgent concerns.    DIET:  We do recommend a small meal at first, but then you may proceed to your regular diet.  Drink plenty of fluids but you should avoid alcoholic  beverages for 24 hours.  ACTIVITY:  You should plan to take it easy for the rest of today and you should NOT DRIVE or use heavy machinery until tomorrow (because of the sedation medicines used during the test).    FOLLOW UP: Our staff will call the number listed on your records the next business day following your procedure.  We will call around 7:15- 8:00 am to check on you and address any questions or concerns that you may have regarding the information given to you following your procedure. If we do not reach you, we will leave a message.     If any biopsies were taken you will be contacted by phone or by letter within the next 1-3 weeks.  Please call us  at [phone] if you have not heard about the biopsies in 3 weeks.    SIGNATURES/CONFIDENTIALITY: You and/or your care partner have signed paperwork which will be entered into your electronic medical record.  These signatures attest to the fact that that the information above on your After Visit Summary has been reviewed and is understood.  Full responsibility of the confidentiality of this discharge information lies with you and/or your care-partner.

## 2024-02-28 NOTE — Op Note (Signed)
 [facility] Patient Name: Savannah Mcneil Procedure Date: 02/28/2024 1:27 PM MRN: [id number] Endoscopist: Lupita FORBES Commander , MD, [id number] Age: 58 Referring MD:  Date of Birth: [birth year] Gender: Female Account #: [id number] Procedure:                Colonoscopy Indications:              Screening for colorectal malignant neoplasm Medicines:                Monitored Anesthesia Care Procedure:                Pre-Anesthesia Assessment:                           - Prior to the procedure, a History and Physical                            was performed, and patient medications and                            allergies were reviewed. The patient's tolerance of                            previous anesthesia was also reviewed. The risks                            and benefits of the procedure and the sedation                            options and risks were discussed with the patient.                            All questions were answered, and informed consent                            was obtained. Prior Anticoagulants: The patient has                            taken no anticoagulant or antiplatelet agents. ASA                            Grade Assessment: III - A patient with severe                            systemic disease. After reviewing the risks and                            benefits, the patient was deemed in satisfactory                            condition to undergo the procedure.                           After obtaining informed consent, the colonoscope  was passed under direct vision. Throughout the                            procedure, the patient's blood pressure, pulse, and                            oxygen saturations were monitored continuously. The                            [id number]-HQ190L Colonoscope [id number] was introduced                            through the anus and advanced to the the cecum,                            identified  by appendiceal orifice and ileocecal                            valve. The colonoscopy was performed without                            difficulty. The patient tolerated the procedure                            well. The quality of the bowel preparation was                            good. The ileocecal valve, appendiceal orifice, and                            rectum were photographed. The bowel preparation                            used was GoLYTELY via split dose instruction. Scope In: 1:34:00 PM Scope Out: 1:46:41 PM Scope Withdrawal Time: 0 hours 9 minutes 43 seconds  Total Procedure Duration: 0 hours 12 minutes 41 seconds  Findings:                 The perianal and digital rectal examinations were                            normal.                           There was evidence of a prior end-to-side                            colo-colonic anastomosis in the distal sigmoid                            colon. This was patent and was characterized by                            healthy appearing mucosa. The anastomosis was  traversed.                           The exam was otherwise without abnormality on                            direct and retroflexion views. Complications:            No immediate complications. Estimated Blood Loss:     Estimated blood loss: none. Impression:               - Patent end-to-side colo-colonic anastomosis,                            characterized by healthy appearing mucosa.                           - The examination was otherwise normal on direct                            and retroflexion views.                           - No specimens collected. Recommendation:           - Patient has a contact number available for                            emergencies. The signs and symptoms of potential                            delayed complications were discussed with the                            patient. Return to normal activities  tomorrow.                            Written discharge instructions were provided to the                            patient.                           - Resume previous diet.                           - Continue present medications.                           - Repeat colonoscopy in 10 years for screening                            purposes. Lupita FORBES Commander, MD 02/28/2024 1:55:45 PM This report has been signed electronically.

## 2024-02-28 NOTE — Progress Notes (Signed)
 Pt resting comfortably. VSS. Airway intact. SBAR complete to RN. All questions answered.

## 2024-02-28 NOTE — Progress Notes (Signed)
 Pt's states no medical or surgical changes since previsit or office visit.

## 2024-02-29 ENCOUNTER — Telehealth: Payer: Self-pay

## 2024-02-29 NOTE — Telephone Encounter (Signed)
  Follow up Call-     02/28/2024   12:44 PM  Call back number  Post procedure Call Back phone  # [phone]  Permission to leave phone message Yes     Patient questions:  Do you have a fever, pain , or abdominal swelling? No. Pain Score  0 *  Have you tolerated food without any problems? Yes.    Have you been able to return to your normal activities? Yes.    Do you have any questions about your discharge instructions: Diet   No. Medications  No. Follow up visit  No.  Do you have questions or concerns about your Care? No.  Actions: * If pain score is 4 or above: No action needed, pain <4.

## 2024-03-06 NOTE — Patient Instructions (Addendum)
 SURGICAL WAITING ROOM VISITATION  Patients having surgery or a procedure may have no more than 2 support people in the waiting area - these visitors may rotate.    Children under the age of 68 must have an adult with them who is not the patient.  Visitors with respiratory illnesses are discouraged from visiting and should remain at home.  If the patient needs to stay at the hospital during part of their recovery, the visitor guidelines for inpatient rooms apply. Pre-op nurse will coordinate an appropriate time for 1 support person to accompany patient in pre-op.  This support person may not rotate.    Please refer to the [facility] website for the visitor guidelines for Inpatients (after your surgery is over and you are in a regular room).       Your procedure is scheduled on:03-21-24     Report to [facility] Main Entrance    Report to admitting at     0900  AM   Call this number if you have problems the morning of surgery [phone]   Do not eat food :After Midnight.   After Midnight you may have the following liquids until __0830____ AM/ DAY OF SURGERY  then nothing by mouth  Water Non-Citrus Juices (without pulp, NO RED-Apple, White grape, White cranberry) Black Coffee (NO MILK/CREAM OR CREAMERS, sugar ok)  Clear Tea (NO MILK/CREAM OR CREAMERS, sugar ok) regular and decaf                             Plain Jell-O (NO RED)                                           Fruit ices (not with fruit pulp, NO RED)                                     Popsicles (NO RED)                                                               Sports drinks like Gatorade (NO RED)                   The day of surgery:  Drink ONE (1) Pre-Surgery Clear Ensure or G2 BY 0830 AM the morning of surgery. Drink in one sitting. Do not sip.  This drink was given to you during your hospital  pre-op appointment visit. Nothing else to drink after completing the  Pre-Surgery Clear Ensure or G2.           If you have questions, please contact your surgeon's office.   FOLLOW  ANY ADDITIONAL PRE OP INSTRUCTIONS YOU RECEIVED FROM YOUR SURGEON'S OFFICE!!!     Oral Hygiene is also important to reduce your risk of infection.                                    Remember - BRUSH YOUR TEETH THE MORNING OF SURGERY WITH  YOUR REGULAR TOOTHPASTE  DENTURES WILL BE REMOVED PRIOR TO SURGERY PLEASE DO NOT APPLY Poly grip OR ADHESIVES!!!   Do NOT smoke after Midnight   Stop all vitamins and herbal supplements 7 days before surgery.   Take these medicines the morning of surgery with A SIP OF WATER: omeprazole, gabapentin , flexeril  if needed, bring rescue inhaler with you, tylenol  if needed  DO NOT TAKE ANY ORAL DIABETIC MEDICATIONS DAY OF YOUR SURGERY  Bring CPAP mask and tubing day of surgery.                              You may not have any metal on your body including hair pins, jewelry, and body piercing             Do not wear make-up, lotions, powders, perfumes/cologne, or deodorant  Do not wear nail polish including gel and S&S, artificial/acrylic nails, or any other type of covering on natural nails including finger and toenails. If you have artificial nails, gel coating, etc. that needs to be removed by a nail salon please have this removed prior to surgery or surgery may need to be canceled/ delayed if the surgeon/ anesthesia feels like they are unable to be safely monitored.   Do not shave  48 hours prior to surgery.        Do not bring valuables to the hospital. [facility] IS NOT             RESPONSIBLE   FOR VALUABLES.   Contacts, glasses, dentures or bridgework may not be worn into surgery.   Bring small overnight bag day of surgery.   DO NOT BRING YOUR HOME MEDICATIONS TO THE HOSPITAL. PHARMACY WILL DISPENSE MEDICATIONS LISTED ON YOUR MEDICATION LIST TO YOU DURING YOUR ADMISSION IN THE HOSPITAL!    Patients discharged on the day of surgery will not be allowed to drive  home.  Someone NEEDS to stay with you for the first 24 hours after anesthesia.   Special Instructions: Bring a copy of your healthcare power of attorney and living will documents the day of surgery if you haven't scanned them before.              Please read over the following fact sheets you were given: IF YOU HAVE QUESTIONS ABOUT YOUR PRE-OP INSTRUCTIONS PLEASE CALL [phone]    If you test positive for Covid or have been in contact with anyone that has tested positive in the last 10 days please notify you surgeon.      Pre-operative 5 CHG Bath Instructions   You can play a key role in reducing the risk of infection after surgery. Your skin needs to be as free of germs as possible. You can reduce the number of germs on your skin by washing with CHG (chlorhexidine  gluconate) soap before surgery. CHG is an antiseptic soap that kills germs and continues to kill germs even after washing.   DO NOT use if you have an allergy to chlorhexidine /CHG or antibacterial soaps. If your skin becomes reddened or irritated, stop using the CHG and notify one of our RNs at [phone].   Please shower with the CHG soap starting 4 days before surgery using the following schedule:     Please keep in mind the following:  DO NOT shave, including legs and underarms, starting the day of your first shower.   You may shave your face at any point before/day of  surgery.  Place clean sheets on your bed the day you start using CHG soap. Use a clean washcloth (not used since being washed) for each shower. DO NOT sleep with pets once you start using the CHG.   CHG Shower Instructions:  If you choose to wash your hair and private area, wash first with your normal shampoo/soap.  After you use shampoo/soap, rinse your hair and body thoroughly to remove shampoo/soap residue.  Turn the water OFF and apply about 3 tablespoons (45 ml) of CHG soap to a CLEAN washcloth.  Apply CHG soap ONLY FROM YOUR NECK DOWN TO YOUR  TOES (washing for 3-5 minutes)  DO NOT use CHG soap on face, private areas, open wounds, or sores.  Pay special attention to the area where your surgery is being performed.  If you are having back surgery, having someone wash your back for you may be helpful. Wait 2 minutes after CHG soap is applied, then you may rinse off the CHG soap.  Pat dry with a clean towel  Put on clean clothes/pajamas   If you choose to wear lotion, please use ONLY the CHG-compatible lotions on the back of this paper.     Additional instructions for the day of surgery: DO NOT APPLY any lotions, deodorants, cologne, or perfumes.   Put on clean/comfortable clothes.  Brush your teeth.  Ask your nurse before applying any prescription medications to the skin.      CHG Compatible Lotions   Aveeno Moisturizing lotion  Cetaphil Moisturizing Cream  Cetaphil Moisturizing Lotion  Clairol Herbal Essence Moisturizing Lotion, Dry Skin  Clairol Herbal Essence Moisturizing Lotion, Extra Dry Skin  Clairol Herbal Essence Moisturizing Lotion, Normal Skin  Curel Age Defying Therapeutic Moisturizing Lotion with Alpha Hydroxy  Curel Extreme Care Body Lotion  Curel Soothing Hands Moisturizing Hand Lotion  Curel Therapeutic Moisturizing Cream, Fragrance-Free  Curel Therapeutic Moisturizing Lotion, Fragrance-Free  Curel Therapeutic Moisturizing Lotion, Original Formula  Eucerin Daily Replenishing Lotion  Eucerin Dry Skin Therapy Plus Alpha Hydroxy Crme  Eucerin Dry Skin Therapy Plus Alpha Hydroxy Lotion  Eucerin Original Crme  Eucerin Original Lotion  Eucerin Plus Crme Eucerin Plus Lotion  Eucerin TriLipid Replenishing Lotion  Keri Anti-Bacterial Hand Lotion  Keri Deep Conditioning Original Lotion Dry Skin Formula Softly Scented  Keri Deep Conditioning Original Lotion, Fragrance Free Sensitive Skin Formula  Keri Lotion Fast Absorbing Fragrance Free Sensitive Skin Formula  Keri Lotion Fast Absorbing Softly Scented Dry  Skin Formula  Keri Original Lotion  Keri Skin Renewal Lotion Keri Silky Smooth Lotion  Keri Silky Smooth Sensitive Skin Lotion  Nivea Body Creamy Conditioning Oil  Nivea Body Extra Enriched Teacher, adult education Moisturizing Lotion Nivea Crme  Nivea Skin Firming Lotion  NutraDerm 30 Skin Lotion  NutraDerm Skin Lotion  NutraDerm Therapeutic Skin Cream  NutraDerm Therapeutic Skin Lotion  ProShield Protective Hand Cream  Provon moisturizing lotion  WHAT IS A BLOOD TRANSFUSION? Blood Transfusion Information  A transfusion is the replacement of blood or some of its parts. Blood is made up of multiple cells which provide different functions. Red blood cells carry oxygen and are used for blood loss replacement. White blood cells fight against infection. Platelets control bleeding. Plasma helps clot blood. Other blood products are available for specialized needs, such as hemophilia or other clotting disorders. BEFORE THE TRANSFUSION  Who gives blood for transfusions?  Healthy volunteers who are fully evaluated to make sure their blood is safe. This is  blood bank blood. Transfusion therapy is the safest it has ever been in the practice of medicine. Before blood is taken from a donor, a complete history is taken to make sure that person has no history of diseases nor engages in risky social behavior (examples are intravenous drug use or sexual activity with multiple partners). The donor's travel history is screened to minimize risk of transmitting infections, such as malaria. The donated blood is tested for signs of infectious diseases, such as HIV and hepatitis. The blood is then tested to be sure it is compatible with you in order to minimize the chance of a transfusion reaction. If you or a relative donates blood, this is often done in anticipation of surgery and is not appropriate for emergency situations. It takes many days to process the donated blood. RISKS AND  COMPLICATIONS Although transfusion therapy is very safe and saves many lives, the main dangers of transfusion include:  Getting an infectious disease. Developing a transfusion reaction. This is an allergic reaction to something in the blood you were given. Every precaution is taken to prevent this. The decision to have a blood transfusion has been considered carefully by your caregiver before blood is given. Blood is not given unless the benefits outweigh the risks. AFTER THE TRANSFUSION Right after receiving a blood transfusion, you will usually feel much better and more energetic. This is especially true if your red blood cells have gotten low (anemic). The transfusion raises the level of the red blood cells which carry oxygen, and this usually causes an energy increase. The nurse administering the transfusion will monitor you carefully for complications. HOME CARE INSTRUCTIONS  No special instructions are needed after a transfusion. You may find your energy is better. Speak with your caregiver about any limitations on activity for underlying diseases you may have. SEEK MEDICAL CARE IF:  Your condition is not improving after your transfusion. You develop redness or irritation at the intravenous (IV) site. SEEK IMMEDIATE MEDICAL CARE IF:  Any of the following symptoms occur over the next 12 hours: Shaking chills. You have a temperature by mouth above 102 F (38.9 C), not controlled by medicine. Chest, back, or muscle pain. People around you feel you are not acting correctly or are confused. Shortness of breath or difficulty breathing. Dizziness and fainting. You get a rash or develop hives. You have a decrease in urine output. Your urine turns a dark color or changes to pink, red, or brown. Any of the following symptoms occur over the next 10 days: You have a temperature by mouth above 102 F (38.9 C), not controlled by medicine. Shortness of breath. Weakness after normal activity. The  white part of the eye turns yellow (jaundice). You have a decrease in the amount of urine or are urinating less often. Your urine turns a dark color or changes to pink, red, or brown. Document Released: 08/14/2000 Document Revised: 11/09/2011 Document Reviewed: 04/02/2008 [employer] Patient Information 2014 [place], [place].  _______________________________________________________________________  Incentive Spirometer  An incentive spirometer is a tool that can help keep your lungs clear and active. This tool measures how well you are filling your lungs with each breath. Taking long deep breaths may help reverse or decrease the chance of developing breathing (pulmonary) problems (especially infection) following: A long period of time when you are unable to move or be active. BEFORE THE PROCEDURE  If the spirometer includes an indicator to show your best effort, your nurse or respiratory therapist will set it to a  desired goal. If possible, sit up straight or lean slightly forward. Try not to slouch. Hold the incentive spirometer in an upright position. INSTRUCTIONS FOR USE  Sit on the edge of your bed if possible, or sit up as far as you can in bed or on a chair. Hold the incentive spirometer in an upright position. Breathe out normally. Place the mouthpiece in your mouth and seal your lips tightly around it. Breathe in slowly and as deeply as possible, raising the piston or the ball toward the top of the column. Hold your breath for 3-5 seconds or for as long as possible. Allow the piston or ball to fall to the bottom of the column. Remove the mouthpiece from your mouth and breathe out normally. Rest for a few seconds and repeat Steps 1 through 7 at least 10 times every 1-2 hours when you are awake. Take your time and take a few normal breaths between deep breaths. The spirometer may include an indicator to show your best effort. Use the indicator as a goal to work toward during each  repetition. After each set of 10 deep breaths, practice coughing to be sure your lungs are clear. If you have an incision (the cut made at the time of surgery), support your incision when coughing by placing a pillow or rolled up towels firmly against it. Once you are able to get out of bed, walk around indoors and cough well. You may stop using the incentive spirometer when instructed by your caregiver.  RISKS AND COMPLICATIONS Take your time so you do not get dizzy or light-headed. If you are in pain, you may need to take or ask for pain medication before doing incentive spirometry. It is harder to take a deep breath if you are having pain. AFTER USE Rest and breathe slowly and easily. It can be helpful to keep track of a log of your progress. Your caregiver can provide you with a simple table to help with this. If you are using the spirometer at home, follow these instructions: SEEK MEDICAL CARE IF:  You are having difficultly using the spirometer. You have trouble using the spirometer as often as instructed. Your pain medication is not giving enough relief while using the spirometer. You develop fever of 100.5 F (38.1 C) or higher. SEEK IMMEDIATE MEDICAL CARE IF:  You cough up bloody sputum that had not been present before. You develop fever of 102 F (38.9 C) or greater. You develop worsening pain at or near the incision site. MAKE SURE YOU:  Understand these instructions. Will watch your condition. Will get help right away if you are not doing well or get worse. Document Released: 12/28/2006 Document Revised: 11/09/2011 Document Reviewed: 02/28/2007 [facility] Patient Information 2014 [place], [place].   ________________________________________________________________________

## 2024-03-06 NOTE — Progress Notes (Signed)
 Please place orders in epic for preop.

## 2024-03-06 NOTE — Progress Notes (Addendum)
 PCP - Levorn Messing, FNP clearance in media   Cardiologist - no  PPM/ICD -  Device Orders -  Rep Notified -   Chest x-ray -  EKG -  Stress Test -  ECHO -  Cardiac Cath -   Sleep Study -  CPAP -   Fasting Blood Sugar -  Checks Blood Sugar __n/a___ times a day  Blood Thinner Instructions:n/a Aspirin Instructions:n/a  ERAS Protcol - PRE-SURGERY Ensure     COVID vaccine -no  Activity--Able to complete a flight of stairs with no CP some SOB due to asthma. Not new for pt. Can complete ADL's with no SOB. Limited due to hip pain  Anesthesia review: HTN, Asthma  Patient denies shortness of breath, fever, cough and chest pain at PAT appointment   All instructions explained to the patient, with a verbal understanding of the material. Patient agrees to go over the instructions while at home for a better understanding. Patient also instructed to self quarantine after being tested for COVID-19. The opportunity to ask questions was provided.

## 2024-03-15 NOTE — Discharge Instructions (Signed)

## 2024-03-15 NOTE — H&P (Signed)
 HIP ARTHROPLASTY ADMISSION H&P  Patient ID: Savannah Mcneil MRN: [id number] DOB/AGE: 29-Dec-1965 58 y.o.  Chief Complaint: left hip pain.  Planned Procedure Date: 03/21/24 Medical Clearance by Dr. Francois     HPI: Savannah Mcneil is a 58 y.o. female who presents for evaluation of Primary osteoarthritis,left hip. The patient has a history of pain and functional disability in the left hip due to arthritis and has failed non-surgical conservative treatments for greater than 12 weeks to include NSAID's and/or analgesics, corticosteriod injections, and activity modification.  Onset of symptoms was gradual, starting >10 years ago with gradually worsening course since that time. The patient noted no past surgery on the left hip.  Patient currently rates pain at 9 out of 10 with activity. Patient has worsening of pain with activity and weight bearing and pain that interferes with activities of daily living.  Patient has evidence of joint space narrowing by imaging studies.  There is no active infection.  Past Medical History:  Diagnosis Date   Arthritis    Asthma    Diverticulitis    GERD (gastroesophageal reflux disease)    Headache    mirgranes   Hypertension    Pneumonia    Past Surgical History:  Procedure Laterality Date   ABLATION     CARPAL TUNNEL RELEASE Right    COLECTOMY     COLONOSCOPY     DILATION AND CURETTAGE OF UTERUS     ESOPHAGOGASTRODUODENOSCOPY     ESOPHAGUS SURGERY     Allergies  Allergen Reactions   Naproxen  Nausea And Vomiting   Aspirin Other (See Comments)    Upsets her stomach   Prior to Admission medications   Medication Sig Start Date End Date Taking? Authorizing Provider  albuterol  (PROVENTIL ) (2.5 MG/3ML) 0.083% nebulizer solution Inhale 2.5 mg into the lungs every 6 (six) hours as needed for wheezing or shortness of breath. 09/15/22  Yes [provider]  albuterol  (VENTOLIN  HFA) 108 (90 Base) MCG/ACT inhaler Inhale 2 puffs into the lungs every  6 (six) hours as needed for wheezing or shortness of breath.   Yes [provider]  Cholecalciferol (VITAMIN D3) 125 MCG (5000 UT) CAPS Take 5,000 Units by mouth daily.   Yes [provider]  cyclobenzaprine  (FLEXERIL ) 10 MG tablet Take 10 mg by mouth 3 (three) times daily as needed for muscle spasms.   Yes [provider]  FIBER ADULT GUMMIES PO Take 1 each by mouth daily.   Yes [provider]  gabapentin  (NEURONTIN ) 800 MG tablet Take 800 mg by mouth 3 (three) times daily.   Yes [provider]  hydrochlorothiazide (HYDRODIURIL) 25 MG tablet Take 25 mg by mouth daily. 12/06/23  Yes [provider]  ibuprofen  (ADVIL ) 200 MG tablet Take 800 mg by mouth every 6 (six) hours as needed for moderate pain (pain score 4-6).   Yes [provider]  losartan (COZAAR) 100 MG tablet Take 100 mg by mouth daily.   Yes [provider]  omeprazole (PRILOSEC) 40 MG capsule Take 40 mg by mouth daily. 05/27/21  Yes [provider]  gabapentin  (NEURONTIN ) 800 MG tablet Take 800 mg by mouth 3 (three) times daily.    [provider]   Social History   Socioeconomic History   Marital status: Single    Spouse name: Not on file   Number of children: Not on file   Years of education: Not on file   Highest education level: Not on file  Occupational History  Not on file  Tobacco Use   Smoking status: Every Day    Current packs/day: 1.00    Types: Cigarettes   Smokeless tobacco: Never  Vaping Use   Vaping status: Never Used  Substance and Sexual Activity   Alcohol use: Yes    Alcohol/week: 1.0 standard drink of alcohol    Types: 1 Standard drinks or equivalent per week    Comment: social   Drug use: Never   Sexual activity: Not on file  Other Topics Concern   Not on file  Social History Narrative   Not on file   Social Drivers of Health   Financial Resource Strain: Not on File (12/18/2021)   Received from [employer]    Financial Resource Strain: 0  Food Insecurity: Not at Risk (12/06/2023)   Received from [facility] Insecurity    Within the past 12 months, you worried that your food would run out before you got money to buy more.: 1  Transportation Needs: Not at Risk (12/06/2023)   Received from [facility] Needs    In the past 12 months, has lack of transportation kept you from medical appointments, meetings, work or from getting things needed for daily living?: 1  Physical Activity: Not on File (12/18/2021)   Received from [facility]   Physical Activity    Physical Activity: 0  Stress: Not on File (12/18/2021)   Received from [facility]   Stress    Stress: 0  Social Connections: Not on File (05/17/2023)   Received from [employer] [facility]   Social Connections    Connectedness: 0   Family History  Problem Relation Age of Onset   Colon cancer Neg Hx    Rectal cancer Neg Hx    Stomach cancer Neg Hx     ROS: Currently denies lightheadedness, dizziness, Fever, chills, CP, SOB.   No personal history of DVT, PE, MI, or CVA. No loose teeth. + dentures All other systems have been reviewed and were otherwise currently negative with the exception of those mentioned in the HPI and as above.  Objective: Vitals: Height 5 feet 5, weight 209 pounds, BP 144/86, pulse 67, temperature 98.6, O2 95% on room air. Physical Exam: General: Alert, NAD. Trendelenberg Gait  HEENT: EOMI, Good Neck Extension  Pulm: No increased work of breathing.  Clear B/L A/P w/o crackle or wheeze.  CV: RRR, No m/g/r appreciated  GI: soft, NT, ND Neuro: Neuro without gross focal deficit.  Sensation intact distally Skin: No lesions in the area of chief complaint MSK/Surgical Site: She has 0 to 50 degrees of forward flexion at the left hip. Pain elicited with active and passive internal and external rotation. EHL and FHL are intact. Distal sensation is intact. 2+ PT pulse.  Imaging Review Plain radiographs  demonstrate severe degenerative joint disease of the left hip.   Preoperative templating of the joint replacement has been completed, documented, and submitted to the Operating Room personnel in order to optimize intra-operative equipment management.  Assessment: Primary osteoarthritis,left hip  Plan: Plan for Procedure(s): ARTHROPLASTY, HIP, TOTAL,POSTERIOR APPROACH  The patient history, physical exam, clinical judgement of the provider and imaging are consistent with end stage degenerative joint disease and total joint arthroplasty is deemed medically necessary. The treatment options including medical management, injection therapy, and arthroplasty were discussed at length. The risks and benefits of Procedure(s): ARTHROPLASTY, HIP, TOTAL,POSTERIOR APPROACH were presented and reviewed.  The risks of nonoperative treatment, versus surgical intervention  including but not limited to continued pain, aseptic loosening, stiffness, dislocation/subluxation, infection, bleeding, nerve injury, blood clots, cardiopulmonary complications, morbidity, mortality, among others were discussed. The patient verbalizes understanding and wishes to proceed with the plan.  Patient is being admitted for surgery, pain control, PT, prophylactic antibiotics, VTE prophylaxis, progressive ambulation, ADL's and discharge planning.    The patient does meet the criteria for TXA which will be used perioperatively.   Xarelto 10  will be used postoperatively for DVT prophylaxis in addition to SCDs, and early ambulation. The patient is planning to be discharged home with OPPT in care of family   Frankey Botting K Daune [employer], PA-C 03/15/2024 5:02 PM

## 2024-03-16 ENCOUNTER — Encounter (HOSPITAL_COMMUNITY)
Admission: RE | Admit: 2024-03-16 | Discharge: 2024-03-16 | Disposition: A | Discharge status: Home / Self Care | Source: Ambulatory Visit | Attending: Orthopedic Surgery | Admitting: Orthopedic Surgery

## 2024-03-16 ENCOUNTER — Encounter (HOSPITAL_COMMUNITY): Payer: Self-pay

## 2024-03-16 ENCOUNTER — Other Ambulatory Visit: Payer: Self-pay

## 2024-03-16 VITALS — BP 128/75 | HR 57 | Temp 98.4°F | Resp 18 | Ht 65.0 in | Wt 198.0 lb

## 2024-03-16 DIAGNOSIS — I10 Essential (primary) hypertension: Secondary | ICD-10-CM | POA: Diagnosis not present

## 2024-03-16 DIAGNOSIS — Z01818 Encounter for other preprocedural examination: Secondary | ICD-10-CM | POA: Diagnosis present

## 2024-03-16 HISTORY — DX: Myoneural disorder, unspecified: G70.9

## 2024-03-16 LAB — BASIC METABOLIC PANEL WITH GFR
Anion gap: 10 (ref 5–15)
BUN: 17 mg/dL (ref 6–20)
CO2: 28 mmol/L (ref 22–32)
Calcium: 9.7 mg/dL (ref 8.9–10.3)
Chloride: 100 mmol/L (ref 98–111)
Creatinine, Ser: 0.91 mg/dL (ref 0.44–1.00)
GFR, Estimated: >60 mL/min (ref >60)
Glucose, Bld: 88 mg/dL (ref 70–99)
Potassium: 4 mmol/L (ref 3.5–5.1)
Sodium: 138 mmol/L (ref 135–145)

## 2024-03-16 LAB — SURGICAL PCR SCREEN
MRSA, PCR: NEGATIVE
Staphylococcus aureus: NEGATIVE

## 2024-03-16 LAB — CBC
HCT: 47.1 % — ABNORMAL HIGH (ref 36.0–46.0)
Hemoglobin: 15.7 g/dL — ABNORMAL HIGH (ref 12.0–15.0)
MCH: 31.6 pg (ref 26.0–34.0)
MCHC: 33.3 g/dL (ref 30.0–36.0)
MCV: 94.8 fL (ref 80.0–100.0)
Platelets: 391 K/uL (ref 150–400)
RBC: 4.97 MIL/uL (ref 3.87–5.11)
RDW: 13.2 % (ref 11.5–15.5)
WBC: 9.2 K/uL (ref 4.0–10.5)
nRBC: 0 % (ref 0.0–0.2)

## 2024-03-21 ENCOUNTER — Encounter (HOSPITAL_COMMUNITY): Payer: Self-pay | Admitting: Orthopedic Surgery

## 2024-03-21 ENCOUNTER — Encounter (HOSPITAL_COMMUNITY)
Admission: RE | Disposition: A | Discharge status: Home / Self Care | Payer: Self-pay | Source: Ambulatory Visit | Attending: Orthopedic Surgery

## 2024-03-21 ENCOUNTER — Ambulatory Visit (HOSPITAL_COMMUNITY): Admitting: Certified Registered"

## 2024-03-21 ENCOUNTER — Ambulatory Visit (HOSPITAL_COMMUNITY)

## 2024-03-21 ENCOUNTER — Other Ambulatory Visit: Payer: Self-pay

## 2024-03-21 ENCOUNTER — Ambulatory Visit (HOSPITAL_COMMUNITY)
Admission: RE | Admit: 2024-03-21 | Discharge: 2024-03-21 | Disposition: A | Discharge status: Home / Self Care | Source: Ambulatory Visit | Attending: Orthopedic Surgery | Admitting: Orthopedic Surgery

## 2024-03-21 DIAGNOSIS — I10 Essential (primary) hypertension: Secondary | ICD-10-CM | POA: Diagnosis not present

## 2024-03-21 DIAGNOSIS — K219 Gastro-esophageal reflux disease without esophagitis: Secondary | ICD-10-CM | POA: Diagnosis not present

## 2024-03-21 DIAGNOSIS — M1612 Unilateral primary osteoarthritis, left hip: Secondary | ICD-10-CM | POA: Diagnosis present

## 2024-03-21 DIAGNOSIS — Z79899 Other long term (current) drug therapy: Secondary | ICD-10-CM | POA: Insufficient documentation

## 2024-03-21 DIAGNOSIS — F17210 Nicotine dependence, cigarettes, uncomplicated: Secondary | ICD-10-CM | POA: Insufficient documentation

## 2024-03-21 DIAGNOSIS — J45909 Unspecified asthma, uncomplicated: Secondary | ICD-10-CM | POA: Insufficient documentation

## 2024-03-21 HISTORY — PX: TOTAL HIP ARTHROPLASTY: SHX124

## 2024-03-21 LAB — TYPE AND SCREEN
ABO/RH(D): O POS
Antibody Screen: NEGATIVE

## 2024-03-21 LAB — ABO/RH: ABO/RH(D): O POS

## 2024-03-21 SURGERY — ARTHROPLASTY, HIP, TOTAL,POSTERIOR APPROACH
Anesthesia: Spinal | Site: Hip | Laterality: Left

## 2024-03-21 MED ORDER — CEFAZOLIN SODIUM-DEXTROSE 2-4 GM/100ML-% IV SOLN: 2.0000 g | Freq: Four times a day (QID) | INTRAVENOUS | Status: DC

## 2024-03-21 MED ORDER — ACETAMINOPHEN 500 MG PO TABS
1000.0000 mg | ORAL_TABLET | Freq: Once | ORAL | Status: AC
  Administered 2024-03-21: 1000 mg via ORAL
  Filled 2024-03-21: qty 2

## 2024-03-21 MED ORDER — LACTATED RINGERS IV BOLUS
500.0000 mL | Freq: Once | INTRAVENOUS | Status: AC
  Administered 2024-03-21: 500 mL via INTRAVENOUS

## 2024-03-21 MED ORDER — OXYCODONE HCL 5 MG PO TABS: 5.0000 mg | ORAL_TABLET | Freq: Once | ORAL | Status: DC | PRN

## 2024-03-21 MED ORDER — PHENYLEPHRINE HCL-NACL 20-0.9 MG/250ML-% IV SOLN
INTRAVENOUS | Status: DC | PRN
  Administered 2024-03-21: 50 ug/min via INTRAVENOUS

## 2024-03-21 MED ORDER — CHLORHEXIDINE GLUCONATE 0.12 % MT SOLN: 15.0000 mL | Freq: Once | OROMUCOSAL | Status: AC

## 2024-03-21 MED ORDER — PHENYLEPHRINE HCL (PRESSORS) 10 MG/ML IV SOLN
INTRAVENOUS | Status: AC
  Filled 2024-03-21: qty 1

## 2024-03-21 MED ORDER — PROPOFOL 500 MG/50ML IV EMUL
INTRAVENOUS | Status: DC | PRN
  Administered 2024-03-21: 100 ug/kg/min via INTRAVENOUS
  Administered 2024-03-21: 50 ug/kg/min via INTRAVENOUS

## 2024-03-21 MED ORDER — LACTATED RINGERS IV BOLUS
250.0000 mL | Freq: Once | INTRAVENOUS | Status: AC
  Administered 2024-03-21: 250 mL via INTRAVENOUS

## 2024-03-21 MED ORDER — AMISULPRIDE (ANTIEMETIC) 5 MG/2ML IV SOLN: 10.0000 mg | Freq: Once | INTRAVENOUS | Status: DC | PRN

## 2024-03-21 MED ORDER — POVIDONE-IODINE 10 % EX SWAB
2.0000 | Freq: Once | CUTANEOUS | Status: AC
  Administered 2024-03-21: 2 via TOPICAL

## 2024-03-21 MED ORDER — FENTANYL CITRATE PF 50 MCG/ML IJ SOSY
PREFILLED_SYRINGE | INTRAMUSCULAR | Status: AC
  Filled 2024-03-21: qty 1

## 2024-03-21 MED ORDER — ORAL CARE MOUTH RINSE
15.0000 mL | Freq: Once | OROMUCOSAL | Status: AC
Start: 2024-03-21 — End: 2024-03-21
  Administered 2024-03-21: 15 mL via OROMUCOSAL

## 2024-03-21 MED ORDER — POVIDONE-IODINE 7.5 % EX SOLN: Freq: Once | CUTANEOUS | Status: DC

## 2024-03-21 MED ORDER — MIDAZOLAM HCL 2 MG/2ML IJ SOLN
INTRAMUSCULAR | Status: AC
  Filled 2024-03-21: qty 2

## 2024-03-21 MED ORDER — PROPOFOL 10 MG/ML IV BOLUS
INTRAVENOUS | Status: DC | PRN
  Administered 2024-03-21: 20 mg via INTRAVENOUS
  Administered 2024-03-21: 40 mg via INTRAVENOUS
  Administered 2024-03-21: 20 mg via INTRAVENOUS

## 2024-03-21 MED ORDER — ACETAMINOPHEN 500 MG PO TABS: 1000.0000 mg | ORAL_TABLET | Freq: Once | ORAL | Status: DC

## 2024-03-21 MED ORDER — OXYCODONE HCL 5 MG PO TABS: 5.0000 mg | ORAL_TABLET | ORAL | 0 refills | Status: DC | PRN

## 2024-03-21 MED ORDER — FENTANYL CITRATE (PF) 250 MCG/5ML IJ SOLN
INTRAMUSCULAR | Status: AC
Start: 2024-03-21 — End: 2024-03-21
  Filled 2024-03-21: qty 5

## 2024-03-21 MED ORDER — FENTANYL CITRATE (PF) 250 MCG/5ML IJ SOLN
INTRAMUSCULAR | Status: DC | PRN
  Administered 2024-03-21: 100 ug via INTRAVENOUS
  Administered 2024-03-21 (×3): 50 ug via INTRAVENOUS

## 2024-03-21 MED ORDER — EPHEDRINE SULFATE-NACL 50-0.9 MG/10ML-% IV SOSY
PREFILLED_SYRINGE | INTRAVENOUS | Status: DC | PRN
  Administered 2024-03-21 (×3): 10 mg via INTRAVENOUS
  Administered 2024-03-21: 5 mg via INTRAVENOUS

## 2024-03-21 MED ORDER — KETOROLAC TROMETHAMINE 30 MG/ML IJ SOLN
INTRAMUSCULAR | Status: DC | PRN
  Administered 2024-03-21: 31 mL

## 2024-03-21 MED ORDER — EPHEDRINE 5 MG/ML INJ
INTRAVENOUS | Status: AC
  Filled 2024-03-21: qty 15

## 2024-03-21 MED ORDER — LACTATED RINGERS IV SOLN: INTRAVENOUS | Status: DC

## 2024-03-21 MED ORDER — 0.9 % SODIUM CHLORIDE (POUR BTL) OPTIME
TOPICAL | Status: DC | PRN
  Administered 2024-03-21: 1000 mL

## 2024-03-21 MED ORDER — SODIUM CHLORIDE 0.9 % IV SOLN: 12.5000 mg | INTRAVENOUS | Status: DC | PRN

## 2024-03-21 MED ORDER — BUPIVACAINE HCL (PF) 0.25 % IJ SOLN
INTRAMUSCULAR | Status: AC
  Filled 2024-03-21: qty 30

## 2024-03-21 MED ORDER — OXYCODONE HCL 5 MG/5ML PO SOLN: 5.0000 mg | Freq: Once | ORAL | Status: DC | PRN

## 2024-03-21 MED ORDER — CEFAZOLIN SODIUM-DEXTROSE 2-4 GM/100ML-% IV SOLN
2.0000 g | INTRAVENOUS | Status: AC
  Administered 2024-03-21: 2 g via INTRAVENOUS
  Filled 2024-03-21: qty 100

## 2024-03-21 MED ORDER — TRANEXAMIC ACID-NACL 1000-0.7 MG/100ML-% IV SOLN
1000.0000 mg | INTRAVENOUS | Status: AC
Start: 2024-03-21 — End: 2024-03-21
  Administered 2024-03-21: 1000 mg via INTRAVENOUS
  Filled 2024-03-21: qty 100

## 2024-03-21 MED ORDER — BUPIVACAINE IN DEXTROSE 0.75-8.25 % IT SOLN
INTRATHECAL | Status: DC | PRN
Start: 2024-03-21 — End: 2024-03-21
  Administered 2024-03-21: 1.8 mL via INTRATHECAL

## 2024-03-21 MED ORDER — MIDAZOLAM HCL 2 MG/2ML IJ SOLN
INTRAMUSCULAR | Status: DC | PRN
  Administered 2024-03-21: 2 mg via INTRAVENOUS

## 2024-03-21 MED ORDER — POVIDONE-IODINE 10 % EX SWAB: 2.0000 | Freq: Once | CUTANEOUS | Status: DC

## 2024-03-21 MED ORDER — STERILE WATER FOR IRRIGATION IR SOLN
Status: DC | PRN
  Administered 2024-03-21: 2000 mL

## 2024-03-21 MED ORDER — SENNA-DOCUSATE SODIUM 8.6-50 MG PO TABS: 2.0000 | ORAL_TABLET | Freq: Every day | ORAL | 1 refills | Status: DC

## 2024-03-21 MED ORDER — PHENYLEPHRINE 80 MCG/ML (10ML) SYRINGE FOR IV PUSH (FOR BLOOD PRESSURE SUPPORT)
PREFILLED_SYRINGE | INTRAVENOUS | Status: DC | PRN
  Administered 2024-03-21: 160 ug via INTRAVENOUS
  Administered 2024-03-21 (×2): 200 ug via INTRAVENOUS

## 2024-03-21 MED ORDER — FENTANYL CITRATE PF 50 MCG/ML IJ SOSY
25.0000 ug | PREFILLED_SYRINGE | INTRAMUSCULAR | Status: DC | PRN
  Administered 2024-03-21 (×2): 50 ug via INTRAVENOUS

## 2024-03-21 MED ORDER — ONDANSETRON HCL 4 MG PO TABS: 4.0000 mg | ORAL_TABLET | Freq: Three times a day (TID) | ORAL | 0 refills | Status: DC | PRN

## 2024-03-21 MED ORDER — RIVAROXABAN 10 MG PO TABS: 10.0000 mg | ORAL_TABLET | Freq: Every day | ORAL | 0 refills | Status: DC

## 2024-03-21 MED ORDER — KETOROLAC TROMETHAMINE 30 MG/ML IJ SOLN
INTRAMUSCULAR | Status: AC
  Filled 2024-03-21: qty 1

## 2024-03-21 SURGICAL SUPPLY — 52 items
ANCHOR SUT KEITH ABD SZ2 STR (SUTURE) ×1 IMPLANT
BAG COUNTER SPONGE SURGICOUNT (BAG) IMPLANT
BIT DRILL 2.0X128 (BIT) ×1 IMPLANT
BLADE SAW SGTL 73X25 THK (BLADE) ×1 IMPLANT
CLSR STERI-STRIP ANTIMIC 1/2X4 (GAUZE/BANDAGES/DRESSINGS) ×2 IMPLANT
COVER SURGICAL LIGHT HANDLE (MISCELLANEOUS) ×1 IMPLANT
DRAPE INCISE IOBAN 66X45 STRL (DRAPES) ×1 IMPLANT
DRAPE POUCH INSTRU U-SHP 10X18 (DRAPES) ×1 IMPLANT
DRAPE SHEET LG 3/4 BI-LAMINATE (DRAPES) ×1 IMPLANT
DRAPE SURG 17X11 SM STRL (DRAPES) ×1 IMPLANT
DRAPE SURG ORHT 6 SPLT 77X108 (DRAPES) ×2 IMPLANT
DRAPE U-SHAPE 47X51 STRL (DRAPES) ×1 IMPLANT
DRSG MEPILEX POST OP 4X12 (GAUZE/BANDAGES/DRESSINGS) ×1 IMPLANT
DURAPREP 26ML APPLICATOR (WOUND CARE) ×2 IMPLANT
ELECT BLADE TIP CTD 4 INCH (ELECTRODE) ×1 IMPLANT
ELECT PENCIL ROCKER SW 15FT (MISCELLANEOUS) ×1 IMPLANT
ELECT REM PT RETURN 15FT ADLT (MISCELLANEOUS) ×1 IMPLANT
ELIMINATOR HOLE APEX DEPUY (Hips) IMPLANT
FACESHIELD WRAPAROUND (MASK) ×1 IMPLANT
FACESHIELD WRAPAROUND OR TEAM (MASK) ×1 IMPLANT
GLOVE BIO SURGEON STRL SZ 6.5 (GLOVE) ×1 IMPLANT
GLOVE BIO SURGEON STRL SZ7.5 (GLOVE) ×1 IMPLANT
GLOVE BIOGEL PI IND STRL 7.0 (GLOVE) ×1 IMPLANT
GLOVE BIOGEL PI IND STRL 8 (GLOVE) ×1 IMPLANT
GOWN STRL SURGICAL XL XLNG (GOWN DISPOSABLE) ×2 IMPLANT
HEAD CERAMIC 36 PLUS 8.5 12 14 (Hips) IMPLANT
HOOD PEEL AWAY T7 (MISCELLANEOUS) ×3 IMPLANT
KIT BASIN OR (CUSTOM PROCEDURE TRAY) ×1 IMPLANT
KIT TURNOVER KIT A (KITS) ×1 IMPLANT
LINER NEUTRAL 52X36MM PLUS 4 (Liner) IMPLANT
MANIFOLD NEPTUNE II (INSTRUMENTS) ×1 IMPLANT
NDL SAFETY ECLIPSE 18X1.5 (NEEDLE) ×2 IMPLANT
NS IRRIG 1000ML POUR BTL (IV SOLUTION) ×1 IMPLANT
PACK TOTAL JOINT (CUSTOM PROCEDURE TRAY) ×1 IMPLANT
PIN SECTOR W/GRIP ACE CUP 52MM (Hips) IMPLANT
PROTECTOR NERVE ULNAR (MISCELLANEOUS) ×1 IMPLANT
STEM FEMORAL SZ 6MM STD ACTIS (Stem) IMPLANT
SUCTION TUBE FRAZIER 12FR DISP (SUCTIONS) ×1 IMPLANT
SUT ETHIBOND NAB CT1 #1 30IN (SUTURE) ×3 IMPLANT
SUT MNCRL AB 3-0 PS2 18 (SUTURE) IMPLANT
SUT STRATAFIX 14 PDO 48 VLT (SUTURE) ×1 IMPLANT
SUT VIC AB 1 CT1 36 (SUTURE) ×2 IMPLANT
SUT VIC AB 2-0 CT1 TAPERPNT 27 (SUTURE) ×2 IMPLANT
SUT VIC AB 3-0 SH 27X BRD (SUTURE) ×2 IMPLANT
SUTURE STRATFX SPIRL PDS+ 70CM (SUTURE) IMPLANT
SYR 30ML LL (SYRINGE) ×1 IMPLANT
SYR 3ML LL SCALE MARK (SYRINGE) ×1 IMPLANT
TOWEL GREEN STERILE FF (TOWEL DISPOSABLE) ×1 IMPLANT
TOWEL OR 17X26 10 PK STRL BLUE (TOWEL DISPOSABLE) ×1 IMPLANT
TRAY FOLEY MTR SLVR 16FR STAT (SET/KITS/TRAYS/PACK) ×1 IMPLANT
TUBE SUCTION HIGH CAP CLEAR NV (SUCTIONS) ×1 IMPLANT
WATER STERILE IRR 1000ML POUR (IV SOLUTION) ×2 IMPLANT

## 2024-03-21 NOTE — Anesthesia Preprocedure Evaluation (Addendum)
 Anesthesia Evaluation  Patient identified by MRN, date of birth, ID band Patient awake    Reviewed: Allergy & Precautions, NPO status , Patient's Chart, lab work & pertinent test results  History of Anesthesia Complications Negative for: history of anesthetic complications  Airway Mallampati: II  TM Distance: >3 FB Neck ROM: Full    Dental  (+) Edentulous Upper, Edentulous Lower   Pulmonary asthma , Current Smoker and Patient abstained from smoking.   Pulmonary exam normal        Cardiovascular hypertension, Pt. on medications Normal cardiovascular exam     Neuro/Psych  Headaches  Neuromuscular disease  negative psych ROS   GI/Hepatic Neg liver ROS,GERD  Medicated and Controlled,,  Endo/Other   Obesity   Renal/GU negative Renal ROS     Musculoskeletal  (+) Arthritis ,    Abdominal   Peds  Hematology negative hematology ROS (+)   Anesthesia Other Findings   Reproductive/Obstetrics                              Anesthesia Physical Anesthesia Plan  ASA: 2  Anesthesia Plan: Spinal   Post-op Pain Management: Tylenol  PO (pre-op)*   Induction:   PONV Risk Score and Plan: 1 and Treatment may vary due to age or medical condition and Propofol  infusion  Airway Management Planned: Natural Airway and Simple Face Mask  Additional Equipment: None  Intra-op Plan:   Post-operative Plan:   Informed Consent: I have reviewed the patients History and Physical, chart, labs and discussed the procedure including the risks, benefits and alternatives for the proposed anesthesia with the patient or authorized representative who has indicated his/her understanding and acceptance.       Plan Discussed with: CRNA and Anesthesiologist  Anesthesia Plan Comments: (Labs reviewed, platelets acceptable. Discussed risks and benefits of spinal, including spinal/epidural hematoma, infection, failed block,  and PDPH. Patient expressed understanding and wished to proceed. )         Anesthesia Quick Evaluation

## 2024-03-21 NOTE — Evaluation (Signed)
 Physical Therapy Brief Evaluation and Discharge Note Patient Details Name: Savannah Mcneil MRN: [id number] DOB: 02-May-1966 Today's Date: 03/21/2024   History of Present Illness  57 yo female presents to therapy s/p L THA, posterior approach on 03/21/2024 due to failure of conservative measures. Pt is currently L LE WBAT and no formal hip precautions. Pt PMH includes but is not limited to: arthritis, asthma, diverticulitis, GERD, HA, tobacco abuse, and HTN.  Clinical Impression     Savannah Mcneil is a 58 y.o. female POD 0 s/p L THA. Patient reports mod I with mobility at baseline. Patient is now limited by functional impairments (see PT problem list below) and requires CGA and cues for transfers and gait with RW. Patient was able to ambulate 55 feet with RW and CGA and progressing to close S and cues for safe walker management. Patient educated on safe sequencing for stair mobility, fall risk prevention, slowly increasing activity, use of CP/ice, pain management and goal and car transfers pt  and significant other verbalized understanding of safe guarding position for people assisting with mobility. Patient instructed in exercises to facilitate ROM and circulation reviewed and HO provided. Patient will benefit from continued skilled PT interventions to address impairments and progress towards PLOF. Patient has met mobility goals at adequate level for discharge home with family/social support and OPPT services; will continue to follow if pt continues acute stay to progress towards Mod I goals.      PT Assessment    Assistance Needed at Discharge       Equipment Recommendations None recommended by PT  Recommendations for Other Services       Precautions/Restrictions Precautions Precautions: Fall Restrictions Weight Bearing Restrictions Per Provider Order: No        Mobility  Bed Mobility          Transfers Overall transfer level: Needs assistance Equipment used: Rolling  walker (2 wheels) Transfers: Sit to/from Stand Sit to Stand: Contact guard assist           General transfer comment: min cues for safety with transfer tasks, proper RW and UE placement for bed, recliner and commode transfers    Ambulation/Gait Ambulation/Gait assistance: Contact guard assist, Supervision Gait Distance (Feet): 55 Feet Assistive device: Rolling walker (2 wheels) Gait Pattern/deviations: Step-to pattern, Decreased stance time - left, Antalgic, Trunk flexed   General Gait Details: slight trunk flexion and B UE support at RW, cues for posture, proper distance from RW and RW management  Home Activity Instructions    Stairs Stairs: Yes Stairs assistance: Contact guard assist Stair Management: Two rails Number of Stairs: 5 General stair comments: min cues for step to pattern, safety, sequencing  Modified Rankin (Stroke Patients Only)        Balance   Sitting-balance support: Feet supported Sitting balance-Leahy Scale: Good     Standing balance support: Bilateral upper extremity supported, During functional activity, Reliant on assistive device for balance Standing balance-Leahy Scale: Fair Standing balance comment: static standing no UE support          Pertinent Vitals/Pain   Pain Assessment Pain Assessment: 0-10 Pain Score: 3  Pain Location: L hip and LE Pain Descriptors / Indicators: Aching, Constant, Discomfort, Dull, Operative site guarding Pain Intervention(s): Limited activity within patient's tolerance, Monitored during session, Premedicated before session, Repositioned, Ice applied     Home Living   Living Arrangements: Children;Spouse/significant other;Other relatives       Home Equipment: Shower seat - built in;Rollator (4 wheels)  Prior Function        UE/LE Assessment               Communication   Communication Communication: Impaired Factors Affecting Communication: Hearing impaired     Cognition          General Comments      Exercises Total Joint Exercises Ankle Circles/Pumps: AROM, Both, 10 reps Quad Sets: AROM, Left, 5 reps Heel Slides: AROM, Left, 5 reps Hip ABduction/ADduction: AROM, Left, 5 reps, Standing Long Arc Quad: AROM, Left, Seated Knee Flexion: AROM, Left, 5 reps, Standing Standing Hip Extension: AROM, Left, 5 reps, Standing   Assessment/Plan    PT Problem List Decreased strength;Decreased range of motion;Decreased activity tolerance;Decreased balance;Decreased mobility;Decreased coordination;Pain       PT Visit Diagnosis Unsteadiness on feet (R26.81);Other abnormalities of gait and mobility (R26.89);Muscle weakness (generalized) (M62.81);Difficulty in walking, not elsewhere classified (R26.2);Pain    No Skilled PT     Co-evaluation                AMPAC 6 Clicks Help needed turning from your back to your side while in a flat bed without using bedrails?: None Help needed moving from lying on your back to sitting on the side of a flat bed without using bedrails?: A Little Help needed moving to and from a bed to a chair (including a wheelchair)?: A Little Help needed standing up from a chair using your arms (e.g., wheelchair or bedside chair)?: A Little Help needed to walk in hospital room?: A Little Help needed climbing 3-5 steps with a railing? : A Little 6 Click Score: 19      End of Session Equipment Utilized During Treatment: Gait belt Activity Tolerance: Patient tolerated treatment well;No increased pain Patient left: in chair;with call bell/phone within reach;with family/visitor present Nurse Communication: Mobility status;Other (comment) (pt readiness for d/c from PT standpoint) PT Visit Diagnosis: Unsteadiness on feet (R26.81);Other abnormalities of gait and mobility (R26.89);Muscle weakness (generalized) (M62.81);Difficulty in walking, not elsewhere classified (R26.2);Pain Pain - Right/Left: Left Pain - part of body: Hip;Leg     Time:  8341-8262 PT Time Calculation (min) (ACUTE ONLY): 39 min  Charges:   PT Evaluation $PT Eval Low Complexity: 1 Low PT Treatments $Gait Training: 8-22 mins $Therapeutic Exercise: 8-22 mins    [place], PT Acute Rehab   [place] VEAR Drone  03/21/2024, 6:01 PM

## 2024-03-21 NOTE — Transfer of Care (Signed)
 Immediate Anesthesia Transfer of Care Note  Patient: Savannah Mcneil  Procedure(s) Performed: ARTHROPLASTY, HIP, TOTAL,POSTERIOR APPROACH (Left: Hip)  Patient Location: PACU  Anesthesia Type:MAC combined with regional for post-op pain  Level of Consciousness: awake and patient cooperative  Airway & Oxygen Therapy: Patient Spontanous Breathing  Post-op Assessment: Report given to RN and Post -op Vital signs reviewed and stable  Post vital signs: Reviewed and stable  Last Vitals:  Vitals Value Taken Time  BP    Temp    Pulse 66 03/21/24 14:54  Resp 10 03/21/24 14:54  SpO2 92 % 03/21/24 14:54  Vitals shown include unfiled device data.  Last Pain:  Vitals:   03/21/24 0901  TempSrc:   PainSc: 8          Complications: No notable events documented.

## 2024-03-21 NOTE — Interval H&P Note (Signed)
 History and Physical Interval Note:  03/21/2024 9:14 AM  Savannah Mcneil  has presented today for surgery, with the diagnosis of Primary osteoarthritis,left hip.  The various methods of treatment have been discussed with the patient and family. After consideration of risks, benefits and other options for treatment, the patient has consented to  Procedure(s): ARTHROPLASTY, HIP, TOTAL,POSTERIOR APPROACH (Left) as a surgical intervention.  The patient's history has been reviewed, patient examined, no change in status, stable for surgery.  I have reviewed the patient's chart and labs.  Questions were answered to the patient's satisfaction.     Fonda SHAUNNA [place]

## 2024-03-21 NOTE — Anesthesia Procedure Notes (Signed)
 Spinal  Patient location during procedure: OR Start time: 03/21/2024 11:39 AM End time: 03/21/2024 11:42 AM Reason for block: surgical anesthesia Staffing Performed: anesthesiologist  Anesthesiologist: Lucious Debby BRAVO, MD Performed by: Lucious Debby BRAVO, MD Authorized by: Lucious Debby BRAVO, MD   Preanesthetic Checklist Completed: patient identified, IV checked, risks and benefits discussed, surgical consent, monitors and equipment checked, pre-op evaluation and timeout performed Spinal Block Patient position: sitting Prep: DuraPrep Patient monitoring: heart rate, cardiac monitor, continuous pulse ox and blood pressure Approach: midline Location: L3-4 Injection technique: single-shot Needle Needle type: Pencan  Needle gauge: 24 G Additional Notes Consent was obtained prior to the procedure with all questions answered and concerns addressed. Risks including, but not limited to, bleeding, infection, nerve damage, paralysis, failed block, inadequate analgesia, allergic reaction, high spinal, itching, and headache were discussed and the patient wished to proceed. Functioning IV was confirmed and monitors were applied. Sterile prep and drape, including hand hygiene, mask, and sterile gloves were used. The patient was positioned and the spine was prepped. The skin was anesthetized with lidocaine . Free flow of clear CSF was obtained prior to injecting local anesthetic into the CSF. The spinal needle aspirated freely following injection. The needle was carefully withdrawn. The patient tolerated the procedure well.   Debby Lucious, MD

## 2024-03-21 NOTE — Op Note (Signed)
 03/21/2024  1:42 PM  PATIENT:  Savannah Mcneil   MRN: [id number]  PRE-OPERATIVE DIAGNOSIS:  left hip primary localized osteoarthritis  POST-OPERATIVE DIAGNOSIS:  same  PROCEDURE:  Procedure(s): ARTHROPLASTY, HIP, TOTAL,POSTERIOR APPROACH  PREOPERATIVE INDICATIONS:    Crysta Gulick is an 58 y.o. female who has a diagnosis of left hip primary localized osteoarthritis and elected for surgical management after failing conservative treatment.  The risks benefits and alternatives were discussed with the patient including but not limited to the risks of nonoperative treatment, versus surgical intervention including infection, bleeding, nerve injury, periprosthetic fracture, the need for revision surgery, dislocation, leg length discrepancy, blood clots, cardiopulmonary complications, morbidity, mortality, among others, and they were willing to proceed.     OPERATIVE REPORT     SURGEON:  Fonda Olmsted, MD    ASSISTANT: Bernarda Mclean, PA-C, (Present throughout the entire procedure,  necessary for completion of procedure in a timely manner, assisting with retraction, instrumentation, and closure)     ANESTHESIA: Spinal  ESTIMATED BLOOD LOSS: 200 mL    COMPLICATIONS:  None.     UNIQUE ASPECTS OF THE CASE: She had a fair amount of rimming osteophyte around the acetabulum making the native anatomy a little bit difficult to assess.  Ultimately I had a fairly sizable anterior wall, somewhat which was osteophyte, but I ended up having excellent stability.  I went with an 8.5 mm neck length, her neck was fairly vertical and very long, and with the 8.5 I eliminated any shuck, and had optimized the posterior stability.  The size 6 femur set down nicely.  I felt like I had good fixation on the cup so I did not use an additional screw.  COMPONENTS:  Implant Name: PIN SECTOR W/GRIP ACE CUP - [id number] Type: Hips Inv. Item: PIN SECTOR W/GRIP ACE CUP Serial No.:  Manufacturer: [employer]PUY  ORTHOPAED[employer] Lot No.: [id number] LRB: Left No. Used: 1 Action: Implanted   Implant Name: LINER NEUTRAL 52X36MM PLUS 4 - [id number] Type: Liner Inv. Item: LINER NEUTRAL 52X36MM PLUS 4 Serial No.:  Manufacturer: [employer]PUY ORTHOPAED[employer] Lot No.: [id number] LRB: Left No. Used: 1 Action: Implanted   Implant Name: STEM FEMORAL SZ STD ACTIS - [id number] Type: Stem Inv. Item: STEM FEMORAL SZ STD ACTIS Serial No.:  Manufacturer: [employer]PU[employer] ORTHOPAED[employer] Lot No.: [id number] LRB: Left No. Used: 1 Action: Implanted   Implant NameBETHA ARMAN DAKIN APEX DEPUY - [id number] Type: Hips Inv. Item: ELIMINATOR HOLE APEX DEPUY Serial No.:  Manufacturer: [employer]PUY ORTHOPAED[employer] Lot No.: [id number] LRB: Left No. Used: 1 Action: Implanted   Implant Name: HEAD CERAMIC 36 PLUS 8.5 12 14  - [id number] Type: Hips Inv. Item: HEAD CERAMIC 36 PLUS 8.5 12 14  Serial No.:  Manufacturer: DEPUY ORTHOPAED[employer] Lot No.: [id number] LRB: Left No. Used: 1 Action: Implanted     PROCEDURE IN DETAIL:   The patient was met in the holding area and  identified.  The appropriate hip was identified and marked at the operative site.  The patient was then transported to the OR  and  placed under anesthesia.  At that point, the patient was  placed in the lateral decubitus position with the operative side up and  secured to the operating room table and all bony prominences padded.     The operative lower extremity was prepped from the iliac crest to the distal leg.  Sterile draping was performed.  Time out was performed prior to incision.      A routine posterolateral approach was utilized via  sharp dissection  carried down through the subcutaneous tissue.  Gross bleeders were Bovie coagulated.  The iliotibial band was identified and incised along the length of the skin incision.  Self-retaining retractors were  inserted.  With the hip internally rotated, the short external rotators  were identified. The piriformis and capsule was  tagged with Ethibond, and the hip capsule released in a T-type fashion.  The femoral neck was exposed, and I resected the femoral neck using the appropriate jig. This was performed at approximately a thumb's breadth above the lesser trochanter.    I then exposed the deep acetabulum, cleared out any tissue including the ligamentum teres.  A wing retractor was not available to be utilized.  After adequate visualization, I excised the labrum, and then sequentially reamed.  I placed the trial acetabulum, which seated nicely, and then impacted the real cup into place.  Appropriate version and inclination was confirmed clinically matching their bony anatomy, and also with the use of the jig.   A trial polyethylene liner was placed.    I then prepared the proximal femur using the cookie-cutter, the lateralizing reamer, and then sequentially reamed and broached.  A trial broach, neck, and head was utilized, and I reduced the hip and it was found to have excellent stability with functional range of motion. The trial components were then removed, and the real polyethylene liner was placed.  I then impacted the real femoral prosthesis into place into the appropriate version, slightly anteverted to the normal anatomy, and I impacted the real head ball into place. The hip was then reduced and taken through functional range of motion and found to have excellent stability. Leg lengths were restored.  I then used a 2 mm drill bits to pass the Ethibond suture from the capsule and piriformis through the greater trochanter, and secured this. Excellent posterior capsular repair was achieved. I also closed the T in the capsule.  I then irrigated the hip copiously again, and repaired the fascia with Stratafix, followed by Vicryl for the subcutaneous tissue, Monocryl for the skin, Steri-Strips and sterile gauze. The wounds were injected. The patient was then awakened and returned to PACU in stable and satisfactory  condition. There were no complications.  Fonda Olmsted, MD Orthopedic Surgeon [phone]   03/21/2024 1:42 PM

## 2024-03-22 NOTE — Anesthesia Postprocedure Evaluation (Signed)
 Anesthesia Post Note  Patient: Savannah Mcneil  Procedure(s) Performed: ARTHROPLASTY, HIP, TOTAL,POSTERIOR APPROACH (Left: Hip)     Patient location during evaluation: PACU Anesthesia Type: Spinal Level of consciousness: awake and alert Pain management: pain level controlled Vital Signs Assessment: post-procedure vital signs reviewed and stable Respiratory status: spontaneous breathing and respiratory function stable Cardiovascular status: blood pressure returned to baseline and stable Postop Assessment: spinal receding and no apparent nausea or vomiting Anesthetic complications: no   No notable events documented.  Last Vitals:  Vitals:   03/21/24 1645 03/21/24 1700  BP: 109/82 113/75  Pulse: 64 (!) 58  Resp:  20  Temp:  36.4 C  SpO2: 100% 100%    Last Pain:  Vitals:   03/21/24 1700  TempSrc:   PainSc: 0-No pain   Pain Goal:                   [name] Like

## 2024-03-24 ENCOUNTER — Encounter (HOSPITAL_COMMUNITY): Payer: Self-pay | Admitting: Orthopedic Surgery

## 2024-03-24 ENCOUNTER — Other Ambulatory Visit: Payer: Self-pay

## 2024-03-24 ENCOUNTER — Ambulatory Visit: Attending: Orthopedic Surgery

## 2024-03-24 DIAGNOSIS — M25552 Pain in left hip: Secondary | ICD-10-CM | POA: Diagnosis present

## 2024-03-24 DIAGNOSIS — M6281 Muscle weakness (generalized): Secondary | ICD-10-CM | POA: Insufficient documentation

## 2024-03-24 DIAGNOSIS — R2681 Unsteadiness on feet: Secondary | ICD-10-CM | POA: Insufficient documentation

## 2024-03-24 NOTE — Progress Notes (Signed)
 OUTPATIENT PHYSICAL THERAPY LOWER EXTREMITY EVALUATION   Patient Name: Savannah Mcneil MRN: [id number] DOB:Dec 22, 1965, 58 y.o., female Today's Date: 03/24/2024  END OF SESSION:  PT End of Session - 03/24/24 1112     Visit Number 1    Number of Visits 25    Date for PT Re-Evaluation 06/16/24    Authorization Type [place] MEDICAID HEALTHY BLUE    PT Start Time 1019    PT Stop Time 1058    PT Time Calculation (min) 39 min    Equipment Utilized During Treatment Gait belt    Activity Tolerance Patient tolerated treatment well    Behavior During Therapy WFL for tasks assessed/performed          Past Medical History:  Diagnosis Date   Arthritis    Asthma    Diverticulitis    GERD (gastroesophageal reflux disease)    Headache    Hx of mirgranes   Hypertension    Neuromuscular disorder (HCC)    carpal tunnel   Pneumonia    Past Surgical History:  Procedure Laterality Date   ABLATION     uterus   CARPAL TUNNEL RELEASE Right    COLECTOMY     COLONOSCOPY     DILATION AND CURETTAGE OF UTERUS     ESOPHAGOGASTRODUODENOSCOPY     ESOPHAGUS SURGERY     TOTAL HIP ARTHROPLASTY Left 03/21/2024   Procedure: ARTHROPLASTY, HIP, TOTAL,POSTERIOR APPROACH;  Surgeon: Josefina Chew, MD;  Location: [facility];  Service: Orthopedics;  Laterality: Left;   Patient Active Problem List   Diagnosis Date Noted   Osteoarthritis of left hip 09/23/2021   Primary osteoarthritis of both hips 07/17/2021   Patellofemoral pain syndrome of both knees 07/17/2021   H/O colectomy 12/12/2020   Arthritis 12/12/2020   Diverticulosis 12/12/2020   Herniated nucleus pulposus, L5-S1 02/04/2016   Asthma 11/02/2008    PCP: [facility]  REFERRING PROVIDER: Josefina Chew, MD  REFERRING DIAG: s/p left posterior total hip replacement   THERAPY DIAG:  Pain in left hip  Muscle weakness (generalized)  Unsteadiness on feet  Rationale for Evaluation and Treatment: Rehabilitation  ONSET DATE:  03/21/2024  SUBJECTIVE:   SUBJECTIVE STATEMENT: Pt is a 58 yr old F referred to PT s/p L THA, posterior approach, POD 3. Pt comes in with 2 wheeled walker. Ambulating with step 2 gait pattern. Pt reports she is getting up and moving at least every hour. Pt sleeping in recliner, pain is worse at night, usually feels good in the morning. Pt reports incision is not bothering her, no excess drainage. Pt denies any fever, chills, sob or other signs of infection.   PERTINENT HISTORY: Arthritis Asthma HTN Lumbar fusion - 13 yrs ago PAIN:  Are you having pain? Yes: NPRS scale: 3/10, worst 10/10 Pain location: L hip and LE Pain description: aching Aggravating factors: standing for long periods Relieving factors: medication, limiting time in 1 position  PRECAUTIONS: Fall  RED FLAGS: None   WEIGHT BEARING RESTRICTIONS: L WBAT  FALLS:  Has patient fallen in last 6 months? No  LIVING ENVIRONMENT:  Lives with: lives with their family Lives in: Mobile home Stairs: Yes: External: 4 steps; can reach both Has following equipment at home: Vannie - 2 wheeled and [employer] - 4 wheeled, was using walker prior to surgery Bathroom: standard toilet height, no grab bars  OCCUPATION: On disability  PLOF: Independent with household mobility without device  PATIENT GOALS: feel better and walk thru community with AD  NEXT MD VISIT: Not Scheduled  OBJECTIVE:  Note: Objective measures were completed at Evaluation unless otherwise noted.  DIAGNOSTIC FINDINGS: see imaging  PATIENT SURVEYS:  LEFS: 13/80  COGNITION: Overall cognitive status: Within functional limits for tasks assessed     SENSATION: NT at eval   EDEMA:  NT at eval  MUSCLE LENGTH: NT at eval  POSTURE: rounded shoulders and increased thoracic kyphosis  PALPATION: NT at eval   LOWER EXTREMITY ROM:  Active ROM Right eval Left eval  Hip flexion    Hip extension    Hip abduction    Hip adduction    Hip internal  rotation    Hip external rotation    Knee flexion    Knee extension    Ankle dorsiflexion    Ankle plantarflexion    Ankle inversion    Ankle eversion     (Blank rows = not tested)  LOWER EXTREMITY MMT:  MMT Right eval Left eval  Hip flexion 4   Hip extension    Hip abduction    Hip adduction    Hip internal rotation    Hip external rotation    Knee flexion 4   Knee extension 4   Ankle dorsiflexion    Ankle plantarflexion    Ankle inversion    Ankle eversion     (Blank rows = not tested)  LOWER EXTREMITY SPECIAL TESTS:    FUNCTIONAL TESTS:  30 seconds chair stand test: 5 with UE use TUG: 36 sec with 2 wheel walker  GAIT: Distance walked: 110ft Assistive device utilized: Environmental consultant - 2 wheeled Level of assistance: Modified independence Comments: Step to gait pattern, slow and shuffling, does not pick up feet much when turning                                                                                                                                TREATMENT: [facility] Adult PT Treatment:                                                DATE: 03/24/2024 Therapeutic Exercise: QS in sitting with knee extended x10 Hook lying glute sets x10 Seated LAQ x 10 Ankle pumps in sitting x20 ea Self Care: Education on maintaining precautions, picking up feet when turning, monitoring for signs and symptoms of infection and expectations for functional progression     PATIENT EDUCATION:  Education details: Educated on evaluation findings, expectations after surgery, posterior precautions, and monitoring for signs and symptoms of infection and when to contact her PCP. Educated on performance of HEP and POC. Educated on proper foot placement during gait and limiting  Person educated: Patient and Spouse Education method: Explanation, Demonstration, Verbal cues, and Handouts Education comprehension: verbalized understanding and returned demonstration  HOME EXERCISE PROGRAM: Access  Code: [email or web address]  URL: [email or web address]   Date: 03/24/2024 Prepared by: [place] Exercises   Seated Long Arc Quad  - 3-5 x daily - 7 x weekly - 3 sets - 10 reps   Seated Ankle Pumps  - 3-5 x daily - 7 x weekly - 2 sets - 20 reps   Seated Quad Set  - 3-5 x daily - 7 x weekly - 3 sets - 10 reps - 5 sec hold   Hooklying Gluteal Sets  - 3-5 x daily - 7 x weekly - 3 sets - 10 reps - 5 sec hold   ASSESSMENT:  CLINICAL IMPRESSION: Patient is a 58 y.o. F who was seen today 3 days s/p L THA. Assessment revealed limitation in ambulatory status, transfers, and LE strength and mobility. Pt able to perform STS with UE use with good lowering control. TUG score of 36 seconds with 2 wheel walker indicates significant limitations in functional mobility and potential for fall risk. LEFS score of 13/80 also indicates high limitations in pts confidence and perceived ability to complete functional LE tasks. Overall pts LE functional ability is good for POD 3 but is still significantly limited compared to pts PLOF. Pt will benefit from skilled PT services to address the above functional limitations.  OBJECTIVE IMPAIRMENTS: Abnormal gait, decreased activity tolerance, decreased balance, decreased coordination, decreased mobility, and decreased strength.   ACTIVITY LIMITATIONS: carrying, lifting, bending, sitting, standing, squatting, stairs, transfers, bed mobility, bathing, toileting, and dressing  PARTICIPATION LIMITATIONS: cleaning, laundry, driving, shopping, community activity, and yard work  PERSONAL FACTORS: Fitness and 1 comorbidity: HTN are also affecting patient's functional outcome.   REHAB POTENTIAL: Good  CLINICAL DECISION MAKING: Stable/uncomplicated  EVALUATION COMPLEXITY: Low   GOALS: Goals reviewed with patient? No  SHORT TERM GOALS: Target date: 04/14/2024 Pt will be compliant and knowledgeable with HEP for improved self management of functional abilities  Baseline:  HEP given at eval  Goal status: INITIAL  2.  Pt will report worst pain being 5/10 or less to improve pt comfort and functional abilities Baseline: 10/10 Goal status: INITIAL  3.  Pt will improve 30 sec sts to 7 to show progression of LE functional strength and improve pts participation in daily activities Baseline: 5 Goal status: INITIAL    LONG TERM GOALS: Target date: 06/16/2024  Pt will self report worst pain at 3/10 to improve patient comfort and increase participation in functional activities  Baseline: 10/10 Goal status: INITIAL  2.  Pt will report normal baseline pain no more than 1/10 to shoe improved comfort and increased tolerance to functional activities  Baseline: 4/10 Goal status: INITIAL  3.  Pt will achieve 4/5 MMT for L hip flexion, abduction and L knee flexion and extension to progress pt towards PLOF and improve participation in meaningful functional activities  Baseline: Not tested at eval  Goal status: INITIAL  4.  Pt will tolerate sitting and standing for longer than 1 hour to improve comfort and patients ability to participate in functional activities  Baseline: unable to for more than 1 hour Goal status: INITIAL  5.  Pt will improve LEFS score to 30/80 to demonstrate improved functional activity and increase participation in household ADLs and community ambulation  Baseline:  Goal status: INITIAL    PLAN:  PT FREQUENCY: 1-2x/week  PT DURATION: 12 weeks  PLANNED INTERVENTIONS: 97110-Therapeutic exercises, 97530- Therapeutic activity, [id number]- Neuromuscular re-education, 97535- Self Care, and [id number]- Manual therapy  PLAN FOR NEXT SESSION: Plan to assess response to HEP, LE  strengthening, gait training  Patient is participating in a Managed Medicaid Plan:  Yes   Janye Maynor, SPT 03/24/24 12:02 PM

## 2024-03-27 ENCOUNTER — Ambulatory Visit (INDEPENDENT_AMBULATORY_CARE_PROVIDER_SITE_OTHER)

## 2024-03-27 DIAGNOSIS — R2681 Unsteadiness on feet: Secondary | ICD-10-CM

## 2024-03-27 DIAGNOSIS — M6281 Muscle weakness (generalized): Secondary | ICD-10-CM

## 2024-03-27 DIAGNOSIS — M25552 Pain in left hip: Secondary | ICD-10-CM

## 2024-03-27 NOTE — Therapy (Signed)
 OUTPATIENT PHYSICAL THERAPY TREATMENT     Patient Name: Savannah Mcneil MRN: [id number] DOB:03/15/1966, 58 y.o., female Today's Date: 03/24/2024   END OF SESSION:  PT End of Session - 03/27/24 1109     Visit Number 2    Number of Visits 25    Date for PT Re-Evaluation 06/16/24    Authorization Type [place] MEDICAID HEALTHY BLUE    PT Start Time 1106    PT Stop Time 1144    PT Time Calculation (min) 38 min    Equipment Utilized During Treatment Gait belt    Activity Tolerance Patient tolerated treatment well    Behavior During Therapy WFL for tasks assessed/performed         PCP: [facility]   REFERRING PROVIDER: Josefina Chew, MD   REFERRING DIAG: s/p left posterior total hip replacement    THERAPY DIAG:  Pain in left hip  Muscle weakness (generalized)  Unsteadiness on feet   Rationale for Evaluation and Treatment: Rehabilitation   ONSET DATE: 03/21/2024   SUBJECTIVE:    SUBJECTIVE STATEMENT: Pt presents to PT with continued slight L LE discomfort, particularly anterior hip. Pt has been compliant with HEP.   EVAL: Pt is a 58 yr old F referred to PT s/p L THA, posterior approach, POD 3. Pt comes in with 2 wheeled walker. Ambulating with step 2 gait pattern. Pt reports she is getting up and moving at least every hour. Pt sleeping in recliner, pain is worse at night, usually feels good in the morning. Pt reports incision is not bothering her, no excess drainage. Pt denies any fever, chills, sob or other signs of infection.    PERTINENT HISTORY: Arthritis Asthma HTN Lumbar fusion - 13 yrs ago PAIN:  Are you having pain? Yes: NPRS scale: 3/10, worst 10/10 Pain location: L hip and LE Pain description: aching Aggravating factors: standing for long periods Relieving factors: medication, limiting time in 1 position   PRECAUTIONS: Fall   RED FLAGS: None      WEIGHT BEARING RESTRICTIONS: L WBAT   FALLS:  Has patient fallen in last 6 months? No    LIVING ENVIRONMENT:  Lives with: lives with their family Lives in: Mobile home Stairs: Yes: External: 4 steps; can reach both Has following equipment at home: Vannie - 2 wheeled and [employer] - 4 wheeled, was using walker prior to surgery Bathroom: standard toilet height, no grab bars   OCCUPATION: On disability   PLOF: Independent with household mobility without device   PATIENT GOALS: feel better and walk thru community with AD   NEXT MD VISIT: Not Scheduled   OBJECTIVE:  Note: Objective measures were completed at Evaluation unless otherwise noted.   DIAGNOSTIC FINDINGS: see imaging   PATIENT SURVEYS:  LEFS: 13/80   COGNITION: Overall cognitive status: Within functional limits for tasks assessed                         SENSATION: NT at eval    EDEMA:  NT at eval   MUSCLE LENGTH: NT at eval   POSTURE: rounded shoulders and increased thoracic kyphosis   PALPATION: NT at eval    LOWER EXTREMITY ROM:   Active ROM Right eval Left eval  Hip flexion      Hip extension      Hip abduction      Hip adduction      Hip internal rotation      Hip external rotation  Knee flexion      Knee extension      Ankle dorsiflexion      Ankle plantarflexion      Ankle inversion      Ankle eversion       (Blank rows = not tested)   LOWER EXTREMITY MMT:   MMT Right eval Left eval  Hip flexion 4    Hip extension      Hip abduction      Hip adduction      Hip internal rotation      Hip external rotation      Knee flexion 4    Knee extension 4    Ankle dorsiflexion      Ankle plantarflexion      Ankle inversion      Ankle eversion       (Blank rows = not tested)   LOWER EXTREMITY SPECIAL TESTS:      FUNCTIONAL TESTS:  30 seconds chair stand test: 5 with UE use TUG: 36 sec with 2 wheel walker   GAIT: Distance walked: 24ft Assistive device utilized: Environmental consultant - 2 wheeled Level of assistance: Modified independence Comments: Step to gait pattern, slow and  shuffling, does not pick up feet much when turning                                                                                                                                  TREATMENT: [facility] Adult PT Treatment:                                                DATE: 03/27/2024 Therapeutic Exercise: NuStep lvl 4 UE/LE x 4 min for functional activity tolerance LAQ 2x10 2.5# STS 2x10 - high table no UE Bridge 2x10  Hooklying clamshell 2x10 GTB Standing hip abd/ext 2x10 L  [facility] Adult PT Treatment:                                                DATE: 03/24/2024 Therapeutic Exercise: QS in sitting with knee extended x10 Hook lying glute sets x10 Seated LAQ x 10 Ankle pumps in sitting x20 ea Self Care: Education on maintaining precautions, picking up feet when turning, monitoring for signs and symptoms of infection and expectations for functional progression       PATIENT EDUCATION:  Education details: Educated on evaluation findings, expectations after surgery, posterior precautions, and monitoring for signs and symptoms of infection and when to contact her PCP. Educated on performance of HEP and POC. Educated on proper foot placement during gait and limiting  Person educated: Patient and Spouse Education method: Explanation, Facilities manager, Verbal cues, and Handouts Education comprehension:  verbalized understanding and returned demonstration   HOME EXERCISE PROGRAM: Access Code: [email or web address] URL: [email or web address] Date: 03/27/2024 Prepared by: Alm Kingdom  Exercises - Seated Long Arc Quad  - 3-5 x daily - 7 x weekly - 3 sets - 10 reps - Seated Ankle Pumps  - 3-5 x daily - 7 x weekly - 2 sets - 20 reps - Seated Quad Set  - 3-5 x daily - 7 x weekly - 3 sets - 10 reps - 5 sec hold - Hooklying Gluteal Sets  - 3-5 x daily - 7 x weekly - 3 sets - 10 reps - 5 sec hold - Supine Bridge  - 1 x daily - 7 x weekly - 2-3 sets - 10 reps - Hooklying Clamshell with Resistance  - 1  x daily - 7 x weekly - 2-3 sets - 10 reps - green band hold   ASSESSMENT:   CLINICAL IMPRESSION: Pt was able to complete all prescribed exercises with no adverse effect. Exercises today focused on improving hip strength and functional mobility. HEP updated for continued LE strengthening. Will continue to be seen and progressed as able per POC.   EVAL: Patient is a 58 y.o. F who was seen today 3 days s/p L THA. Assessment revealed limitation in ambulatory status, transfers, and LE strength and mobility. Pt able to perform STS with UE use with good lowering control. TUG score of 36 seconds with 2 wheel walker indicates significant limitations in functional mobility and potential for fall risk. LEFS score of 13/80 also indicates high limitations in pts confidence and perceived ability to complete functional LE tasks. Overall pts LE functional ability is good for POD 3 but is still significantly limited compared to pts PLOF. Pt will benefit from skilled PT services to address the above functional limitations.   OBJECTIVE IMPAIRMENTS: Abnormal gait, decreased activity tolerance, decreased balance, decreased coordination, decreased mobility, and decreased strength.    ACTIVITY LIMITATIONS: carrying, lifting, bending, sitting, standing, squatting, stairs, transfers, bed mobility, bathing, toileting, and dressing   PARTICIPATION LIMITATIONS: cleaning, laundry, driving, shopping, community activity, and yard work   PERSONAL FACTORS: Fitness and 1 comorbidity: HTN are also affecting patient's functional outcome.    REHAB POTENTIAL: Good   CLINICAL DECISION MAKING: Stable/uncomplicated   EVALUATION COMPLEXITY: Low     GOALS: Goals reviewed with patient? No   SHORT TERM GOALS: Target date: 04/14/2024 Pt will be compliant and knowledgeable with HEP for improved self management of functional abilities  Baseline: HEP given at eval  Goal status: INITIAL   2.  Pt will report worst pain being 5/10 or  less to improve pt comfort and functional abilities Baseline: 10/10 Goal status: INITIAL   3.  Pt will improve 30 sec sts to 7 to show progression of LE functional strength and improve pts participation in daily activities Baseline: 5 Goal status: INITIAL       LONG TERM GOALS: Target date: 06/16/2024   Pt will self report worst pain at 3/10 to improve patient comfort and increase participation in functional activities  Baseline: 10/10 Goal status: INITIAL   2.  Pt will report normal baseline pain no more than 1/10 to shoe improved comfort and increased tolerance to functional activities  Baseline: 4/10 Goal status: INITIAL   3.  Pt will achieve 4/5 MMT for L hip flexion, abduction and L knee flexion and extension to progress pt towards PLOF and improve participation in meaningful functional activities  Baseline: Not tested at eval  Goal  status: INITIAL   4.  Pt will tolerate sitting and standing for longer than 1 hour to improve comfort and patients ability to participate in functional activities  Baseline: unable to for more than 1 hour Goal status: INITIAL   5.  Pt will improve LEFS score to 30/80 to demonstrate improved functional activity and increase participation in household ADLs and community ambulation  Baseline:  Goal status: INITIAL       PLAN:   PT FREQUENCY: 1-2x/week   PT DURATION: 12 weeks   PLANNED INTERVENTIONS: 97110-Therapeutic exercises, 97530- Therapeutic activity, 97112- Neuromuscular re-education, 97535- Self Care, and 02859- Manual therapy   PLAN FOR NEXT SESSION: Plan to assess response to HEP, LE strengthening, gait training

## 2024-03-29 ENCOUNTER — Ambulatory Visit: Admitting: Physical Therapy

## 2024-03-29 ENCOUNTER — Encounter: Payer: Self-pay | Admitting: Physical Therapy

## 2024-03-29 DIAGNOSIS — M25552 Pain in left hip: Secondary | ICD-10-CM

## 2024-03-29 DIAGNOSIS — M6281 Muscle weakness (generalized): Secondary | ICD-10-CM

## 2024-03-29 DIAGNOSIS — R2681 Unsteadiness on feet: Secondary | ICD-10-CM

## 2024-03-29 NOTE — Therapy (Signed)
 OUTPATIENT PHYSICAL THERAPY TREATMENT     Savannah Mcneil Name: Savannah Mcneil MRN: [id number] DOB:16-Dec-1965, 58 y.o., female Today's Date: 03/24/2024   END OF SESSION:  Savannah Mcneil End of Session - 03/29/24 1102     Visit Number 3    Number of Visits 25    Date for Savannah Mcneil Re-Evaluation 06/16/24    Authorization Type [place] MEDICAID HEALTHY BLUE    Authorization Time Period 03/24/24-06/22/22    Authorization - Visit Number 3    Authorization - Number of Visits 13    Savannah Mcneil Start Time 1100    Savannah Mcneil Stop Time 1138    Savannah Mcneil Time Calculation (min) 38 min         PCP: [facility]   REFERRING PROVIDER: Josefina Chew, MD   REFERRING DIAG: s/p left posterior total hip replacement    THERAPY DIAG:  Pain in left hip  Muscle weakness (generalized)  Unsteadiness on feet   Rationale for Evaluation and Treatment: Rehabilitation   ONSET DATE: 03/21/2024   SUBJECTIVE:    SUBJECTIVE STATEMENT: Savannah Mcneil reports hip feels better every day. Her thighs are sore from getting up and down.    EVAL: Savannah Mcneil is a 58 yr old F referred to Savannah Mcneil s/p L THA, posterior approach, POD 3. Savannah Mcneil comes in with 2 wheeled walker. Ambulating with step 2 gait pattern. Savannah Mcneil reports she is getting up and moving at least every hour. Savannah Mcneil sleeping in recliner, pain is worse at night, usually feels good in the morning. Savannah Mcneil reports incision is not bothering her, no excess drainage. Savannah Mcneil denies any fever, chills, sob or other signs of infection.    PERTINENT HISTORY: Arthritis Asthma HTN Lumbar fusion - 13 yrs ago PAIN:  Are you having pain? Yes: NPRS scale: 3/10, worst 10/10 Pain location: L hip and LE Pain description: aching Aggravating factors: standing for long periods Relieving factors: medication, limiting time in 1 position   PRECAUTIONS: Fall   RED FLAGS: None      WEIGHT BEARING RESTRICTIONS: L WBAT   FALLS:  Has Savannah Mcneil fallen in last 6 months? No   LIVING ENVIRONMENT:  Lives with: lives with their family Lives in:  Mobile home Stairs: Yes: External: 4 steps; can reach both Has following equipment at home: [employer] - 2 wheeled and [employer] - 4 wheeled, was using walker prior to surgery Bathroom: standard toilet height, no grab bars   OCCUPATION: On disability   PLOF: Independent with household mobility without device   Savannah Mcneil GOALS: feel better and walk thru community with AD   NEXT MD VISIT: Not Scheduled   OBJECTIVE:  Note: Objective measures were completed at Evaluation unless otherwise noted.   DIAGNOSTIC FINDINGS: see imaging   Savannah Mcneil SURVEYS:  LEFS: 13/80   COGNITION: Overall cognitive status: Within functional limits for tasks assessed                         SENSATION: NT at eval    EDEMA:  NT at eval   MUSCLE LENGTH: NT at eval   POSTURE: rounded shoulders and increased thoracic kyphosis   PALPATION: NT at eval    LOWER EXTREMITY ROM:   Active ROM Right eval Left eval  Hip flexion      Hip extension      Hip abduction      Hip adduction      Hip internal rotation      Hip external rotation      Knee  flexion      Knee extension      Ankle dorsiflexion      Ankle plantarflexion      Ankle inversion      Ankle eversion       (Blank rows = not tested)   LOWER EXTREMITY MMT:   MMT Right eval Left eval  Hip flexion 4    Hip extension      Hip abduction      Hip adduction      Hip internal rotation      Hip external rotation      Knee flexion 4    Knee extension 4    Ankle dorsiflexion      Ankle plantarflexion      Ankle inversion      Ankle eversion       (Blank rows = not tested)   LOWER EXTREMITY SPECIAL TESTS:      FUNCTIONAL TESTS:  30 seconds chair stand test: 5 with UE use TUG: 36 sec with 2 wheel walker   GAIT: Distance walked: 16ft Assistive device utilized: Environmental consultant - 2 wheeled Level of assistance: Modified independence Comments: Step to gait pattern, slow and shuffling, does not pick up feet much when turning                                                                                                                                   TREATMENT: [facility] Savannah Mcneil Treatment:                                                DATE: 03/29/24 Therapeutic Exercise: Nustep L4 UE/LE x 5 minutes  Standing hip abduction and ext  Standing left hip flexion Standing heel raise  Lateral weight shifting  at counter 5 sec x 8 Seated LAQ 3# x 10  HL Gluteal queeze x 10 HL bridge x 10  HL ball squeeze x 10  Seated QS, ankle circles      [facility] Adult Savannah Mcneil Treatment:                                                DATE: 03/27/2024 Therapeutic Exercise: NuStep lvl 4 UE/LE x 4 min for functional activity tolerance LAQ 2x10 2.5# STS 2x10 - high table no UE Bridge 2x10  Hooklying clamshell 2x10 GTB Standing hip abd/ext 2x10 L  [facility] Adult Savannah Mcneil Treatment:  DATE: 03/24/2024 Therapeutic Exercise: QS in sitting with knee extended x10 Hook lying glute sets x10 Seated LAQ x 10 Ankle pumps in sitting x20 ea Self Care: Education on maintaining precautions, picking up feet when turning, monitoring for signs and symptoms of infection and expectations for functional progression       Savannah Mcneil EDUCATION:  Education details: Educated on evaluation findings, expectations after surgery, posterior precautions, and monitoring for signs and symptoms of infection and when to contact her PCP. Educated on performance of HEP and POC. Educated on proper foot placement during gait and limiting  Person educated: Savannah Mcneil and Spouse Education method: Explanation, Demonstration, Verbal cues, and Handouts Education comprehension: verbalized understanding and returned demonstration   HOME EXERCISE PROGRAM: Access Code: [email or web address] URL: [email or web address] Date: 03/27/2024 Prepared by: Alm Kingdom  Exercises - Seated Long Arc Quad  - 3-5 x daily - 7 x weekly - 3 sets - 10 reps - Seated Ankle Pumps  -  3-5 x daily - 7 x weekly - 2 sets - 20 reps - Seated Quad Set  - 3-5 x daily - 7 x weekly - 3 sets - 10 reps - 5 sec hold - Hooklying Gluteal Sets  - 3-5 x daily - 7 x weekly - 3 sets - 10 reps - 5 sec hold - Supine Bridge  - 1 x daily - 7 x weekly - 2-3 sets - 10 reps - Hooklying Clamshell with Resistance  - 1 x daily - 7 x weekly - 2-3 sets - 10 reps - green band hold   ASSESSMENT:   CLINICAL IMPRESSION: Savannah Mcneil was able to complete all prescribed exercises with no adverse effect. Exercises today focused on improving hip strength and functional mobility. Limited tolerance to lateral weight shifts onto surgical LE.  Will continue to be seen and progressed as able per POC.   EVAL: Savannah Mcneil is a 58 y.o. F who was seen today 3 days s/p L THA. Assessment revealed limitation in ambulatory status, transfers, and LE strength and mobility. Savannah Mcneil able to perform STS with UE use with good lowering control. TUG score of 36 seconds with 2 wheel walker indicates significant limitations in functional mobility and potential for fall risk. LEFS score of 13/80 also indicates high limitations in pts confidence and perceived ability to complete functional LE tasks. Overall pts LE functional ability is good for POD 3 but is still significantly limited compared to pts PLOF. Savannah Mcneil will benefit from skilled Savannah Mcneil services to address the above functional limitations.   OBJECTIVE IMPAIRMENTS: Abnormal gait, decreased activity tolerance, decreased balance, decreased coordination, decreased mobility, and decreased strength.    ACTIVITY LIMITATIONS: carrying, lifting, bending, sitting, standing, squatting, stairs, transfers, bed mobility, bathing, toileting, and dressing   PARTICIPATION LIMITATIONS: cleaning, laundry, driving, shopping, community activity, and yard work   PERSONAL FACTORS: Fitness and 1 comorbidity: HTN are also affecting Savannah Mcneil's functional outcome.    REHAB POTENTIAL: Good   CLINICAL DECISION MAKING:  Stable/uncomplicated   EVALUATION COMPLEXITY: Low     GOALS: Goals reviewed with Savannah Mcneil? No   SHORT TERM GOALS: Target date: 04/14/2024 Savannah Mcneil will be compliant and knowledgeable with HEP for improved self management of functional abilities  Baseline: HEP given at eval  Goal status: INITIAL   2.  Savannah Mcneil will report worst pain being 5/10 or less to improve Savannah Mcneil comfort and functional abilities Baseline: 10/10 Goal status: INITIAL   3.  Savannah Mcneil will improve 30 sec sts to 7 to show progression of LE functional strength and  improve pts participation in daily activities Baseline: 5 Goal status: INITIAL       LONG TERM GOALS: Target date: 06/16/2024   Savannah Mcneil will self report worst pain at 3/10 to improve Savannah Mcneil comfort and increase participation in functional activities  Baseline: 10/10 Goal status: INITIAL   2.  Savannah Mcneil will report normal baseline pain no more than 1/10 to shoe improved comfort and increased tolerance to functional activities  Baseline: 4/10 Goal status: INITIAL   3.  Savannah Mcneil will achieve 4/5 MMT for L hip flexion, abduction and L knee flexion and extension to progress Savannah Mcneil towards PLOF and improve participation in meaningful functional activities  Baseline: Not tested at eval  Goal status: INITIAL   4.  Savannah Mcneil will tolerate sitting and standing for longer than 1 hour to improve comfort and patients ability to participate in functional activities  Baseline: unable to for more than 1 hour Goal status: INITIAL   5.  Savannah Mcneil will improve LEFS score to 30/80 to demonstrate improved functional activity and increase participation in household ADLs and community ambulation  Baseline:  Goal status: INITIAL       PLAN:   Savannah Mcneil FREQUENCY: 1-2x/week   Savannah Mcneil DURATION: 12 weeks   PLANNED INTERVENTIONS: 97110-Therapeutic exercises, 97530- Therapeutic activity, 97112- Neuromuscular re-education, 97535- Self Care, and 02859- Manual therapy   PLAN FOR NEXT SESSION: Plan to assess response to HEP, LE  strengthening, gait training

## 2024-04-03 NOTE — Therapy (Incomplete)
 OUTPATIENT PHYSICAL THERAPY TREATMENT     Patient Name: Savannah Mcneil MRN: [id number] DOB:06-Sep-1965, 58 y.o., female Today's Date: 03/24/2024   END OF SESSION:   PCP: [facility]   REFERRING PROVIDER: Josefina Chew, MD   REFERRING DIAG: s/p left posterior total hip replacement    THERAPY DIAG:  No diagnosis found.   Rationale for Evaluation and Treatment: Rehabilitation   ONSET DATE: 03/21/2024   SUBJECTIVE:    SUBJECTIVE STATEMENT: Pt reports hip feels better every day. Her thighs are sore from getting up and down.    EVAL: Pt is a 58 yr old F referred to PT s/p L THA, posterior approach, POD 3. Pt comes in with 2 wheeled walker. Ambulating with step 2 gait pattern. Pt reports she is getting up and moving at least every hour. Pt sleeping in recliner, pain is worse at night, usually feels good in the morning. Pt reports incision is not bothering her, no excess drainage. Pt denies any fever, chills, sob or other signs of infection.    PERTINENT HISTORY: Arthritis Asthma HTN Lumbar fusion - 13 yrs ago PAIN:  Are you having pain? Yes: NPRS scale: 3/10, worst 10/10 Pain location: L hip and LE Pain description: aching Aggravating factors: standing for long periods Relieving factors: medication, limiting time in 1 position   PRECAUTIONS: Fall   RED FLAGS: None      WEIGHT BEARING RESTRICTIONS: L WBAT   FALLS:  Has patient fallen in last 6 months? No   LIVING ENVIRONMENT:  Lives with: lives with their family Lives in: Mobile home Stairs: Yes: External: 4 steps; can reach both Has following equipment at home: [employer] - 2 wheeled and [employer] - 4 wheeled, was using walker prior to surgery Bathroom: standard toilet height, no grab bars   OCCUPATION: On disability   PLOF: Independent with household mobility without device   PATIENT GOALS: feel better and walk thru community with AD   NEXT MD VISIT: Not Scheduled   OBJECTIVE:  Note:  Objective measures were completed at Evaluation unless otherwise noted.   DIAGNOSTIC FINDINGS: see imaging   PATIENT SURVEYS:  LEFS: 13/80   COGNITION: Overall cognitive status: Within functional limits for tasks assessed                         SENSATION: NT at eval    EDEMA:  NT at eval   MUSCLE LENGTH: NT at eval   POSTURE: rounded shoulders and increased thoracic kyphosis   PALPATION: NT at eval    LOWER EXTREMITY ROM:   Active ROM Right eval Left eval  Hip flexion      Hip extension      Hip abduction      Hip adduction      Hip internal rotation      Hip external rotation      Knee flexion      Knee extension      Ankle dorsiflexion      Ankle plantarflexion      Ankle inversion      Ankle eversion       (Blank rows = not tested)   LOWER EXTREMITY MMT:   MMT Right eval Left eval  Hip flexion 4    Hip extension      Hip abduction      Hip adduction      Hip internal rotation      Hip external rotation  Knee flexion 4    Knee extension 4    Ankle dorsiflexion      Ankle plantarflexion      Ankle inversion      Ankle eversion       (Blank rows = not tested)   LOWER EXTREMITY SPECIAL TESTS:      FUNCTIONAL TESTS:  30 seconds chair stand test: 5 with UE use TUG: 36 sec with 2 wheel walker   GAIT: Distance walked: 25ft Assistive device utilized: Environmental consultant - 2 wheeled Level of assistance: Modified independence Comments: Step to gait pattern, slow and shuffling, does not pick up feet much when turning                                                                                                                                  TREATMENT: [facility] Adult PT Treatment:                                                DATE: 04/04/24 Therapeutic Exercise: Nustep L4 UE/LE x 5 minutes  Standing hip abduction and ext  Standing left hip flexion Standing heel raise  Lateral weight shifting  at counter 5 sec x 8 Seated LAQ 3# x 10  HL Gluteal queeze  x 10 HL bridge x 10  HL ball squeeze x 10  Seated QS, ankle circles  Therapeutic Exercise: *** Manual Therapy: *** Neuromuscular re-ed: *** Therapeutic Activity: *** Modalities: *** Self Care: ***  [facility] Adult PT Treatment:                                                DATE: 03/29/24 Therapeutic Exercise: Nustep L4 UE/LE x 5 minutes  Standing hip abduction and ext  Standing left hip flexion Standing heel raise  Lateral weight shifting  at counter 5 sec x 8 Seated LAQ 3# x 10  HL Gluteal queeze x 10 HL bridge x 10  HL ball squeeze x 10  Seated QS, ankle circles   [facility] Adult PT Treatment:                                                DATE: 03/27/2024 Therapeutic Exercise: NuStep lvl 4 UE/LE x 4 min for functional activity tolerance LAQ 2x10 2.5# STS 2x10 - high table no UE Bridge 2x10  Hooklying clamshell 2x10 GTB Standing hip abd/ext 2x10 L   PATIENT EDUCATION:  Education details: Educated on evaluation findings, expectations after surgery, posterior precautions, and monitoring for signs and symptoms of infection and when  to contact her PCP. Educated on performance of HEP and POC. Educated on proper foot placement during gait and limiting  Person educated: Patient and Spouse Education method: Explanation, Demonstration, Verbal cues, and Handouts Education comprehension: verbalized understanding and returned demonstration   HOME EXERCISE PROGRAM: Access Code: [email or web address] URL: [email or web address] Date: 03/27/2024 Prepared by: Alm Kingdom  Exercises - Seated Long Arc Quad  - 3-5 x daily - 7 x weekly - 3 sets - 10 reps - Seated Ankle Pumps  - 3-5 x daily - 7 x weekly - 2 sets - 20 reps - Seated Quad Set  - 3-5 x daily - 7 x weekly - 3 sets - 10 reps - 5 sec hold - Hooklying Gluteal Sets  - 3-5 x daily - 7 x weekly - 3 sets - 10 reps - 5 sec hold - Supine Bridge  - 1 x daily - 7 x weekly - 2-3 sets - 10 reps - Hooklying Clamshell with Resistance  - 1 x  daily - 7 x weekly - 2-3 sets - 10 reps - green band hold   ASSESSMENT:   CLINICAL IMPRESSION: Pt was able to complete all prescribed exercises with no adverse effect. Exercises today focused on improving hip strength and functional mobility. Limited tolerance to lateral weight shifts onto surgical LE.  Will continue to be seen and progressed as able per POC.   EVAL: Patient is a 58 y.o. F who was seen today 3 days s/p L THA. Assessment revealed limitation in ambulatory status, transfers, and LE strength and mobility. Pt able to perform STS with UE use with good lowering control. TUG score of 36 seconds with 2 wheel walker indicates significant limitations in functional mobility and potential for fall risk. LEFS score of 13/80 also indicates high limitations in pts confidence and perceived ability to complete functional LE tasks. Overall pts LE functional ability is good for POD 3 but is still significantly limited compared to pts PLOF. Pt will benefit from skilled PT services to address the above functional limitations.   OBJECTIVE IMPAIRMENTS: Abnormal gait, decreased activity tolerance, decreased balance, decreased coordination, decreased mobility, and decreased strength.    ACTIVITY LIMITATIONS: carrying, lifting, bending, sitting, standing, squatting, stairs, transfers, bed mobility, bathing, toileting, and dressing   PARTICIPATION LIMITATIONS: cleaning, laundry, driving, shopping, community activity, and yard work   PERSONAL FACTORS: Fitness and 1 comorbidity: HTN are also affecting patient's functional outcome.    REHAB POTENTIAL: Good   CLINICAL DECISION MAKING: Stable/uncomplicated   EVALUATION COMPLEXITY: Low     GOALS: Goals reviewed with patient? No   SHORT TERM GOALS: Target date: 04/14/2024 Pt will be compliant and knowledgeable with HEP for improved self management of functional abilities  Baseline: HEP given at eval  Goal status: INITIAL   2.  Pt will report worst pain  being 5/10 or less to improve pt comfort and functional abilities Baseline: 10/10 Goal status: INITIAL   3.  Pt will improve 30 sec sts to 7 to show progression of LE functional strength and improve pts participation in daily activities Baseline: 5 Goal status: INITIAL       LONG TERM GOALS: Target date: 06/16/2024   Pt will self report worst pain at 3/10 to improve patient comfort and increase participation in functional activities  Baseline: 10/10 Goal status: INITIAL   2.  Pt will report normal baseline pain no more than 1/10 to shoe improved comfort and increased tolerance to functional activities  Baseline: 4/10 Goal status: INITIAL  3.  Pt will achieve 4/5 MMT for L hip flexion, abduction and L knee flexion and extension to progress pt towards PLOF and improve participation in meaningful functional activities  Baseline: Not tested at eval  Goal status: INITIAL   4.  Pt will tolerate sitting and standing for longer than 1 hour to improve comfort and patients ability to participate in functional activities  Baseline: unable to for more than 1 hour Goal status: INITIAL   5.  Pt will improve LEFS score to 30/80 to demonstrate improved functional activity and increase participation in household ADLs and community ambulation  Baseline:  Goal status: INITIAL       PLAN:   PT FREQUENCY: 1-2x/week   PT DURATION: 12 weeks   PLANNED INTERVENTIONS: 97110-Therapeutic exercises, 97530- Therapeutic activity, 97112- Neuromuscular re-education, 97535- Self Care, and 02859- Manual therapy   PLAN FOR NEXT SESSION: Plan to assess response to HEP, LE strengthening, gait training

## 2024-04-04 ENCOUNTER — Ambulatory Visit

## 2024-04-06 ENCOUNTER — Ambulatory Visit: Attending: Orthopedic Surgery | Admitting: Physical Therapy

## 2024-04-06 ENCOUNTER — Encounter: Payer: Self-pay | Admitting: Physical Therapy

## 2024-04-06 DIAGNOSIS — R2681 Unsteadiness on feet: Secondary | ICD-10-CM | POA: Insufficient documentation

## 2024-04-06 DIAGNOSIS — M25552 Pain in left hip: Secondary | ICD-10-CM | POA: Insufficient documentation

## 2024-04-06 DIAGNOSIS — M6281 Muscle weakness (generalized): Secondary | ICD-10-CM | POA: Insufficient documentation

## 2024-04-06 NOTE — Therapy (Signed)
 OUTPATIENT PHYSICAL THERAPY TREATMENT     Patient Name: Savannah Mcneil MRN: [id number] DOB:1966-04-22, 58 y.o., female Today's Date: 03/24/2024   END OF SESSION:   PT End of Session - 03/29/24 1102       Visit Number 3     Number of Visits 25     Date for PT Re-Evaluation 06/16/24     Authorization Type [place] MEDICAID HE[employer]Y BLUE     Authorization Time Period 03/24/24-06/22/22     Authorization - Visit Number 3     Authorization - Number of Visits 13     PT Start Time 1100     PT Stop Time 1138     PT Time Calculation (min) 38 min            PCP: [facility]   REFERRING PROVIDER: Josefina Chew, MD   REFERRING DIAG: s/p left posterior total hip replacement    THERAPY DIAG:  Pain in left hip   Muscle weakness (generalized)   Unsteadiness on feet   Rationale for Evaluation and Treatment: Rehabilitation   ONSET DATE: 03/21/2024   SUBJECTIVE:    SUBJECTIVE STATEMENT: Pt reports feeling increased pain and swelling recently. Met with DR yesterday who looked at incision and said everything looks good. They removed bandage and did not replace it.     EVAL: Pt is a 58 yr old F referred to PT s/p L THA, posterior approach, POD 3. Pt comes in with 2 wheeled walker. Ambulating with step 2 gait pattern. Pt reports she is getting up and moving at least every hour. Pt sleeping in recliner, pain is worse at night, usually feels good in the morning. Pt reports incision is not bothering her, no excess drainage. Pt denies any fever, chills, sob or other signs of infection.    PERTINENT HISTORY: Arthritis Asthma HTN Lumbar fusion - 13 yrs ago PAIN:  Are you having pain? Yes: NPRS scale: 3/10, worst 10/10 Pain location: L hip and LE Pain description: aching Aggravating factors: standing for long periods Relieving factors: medication, limiting time in 1 position   PRECAUTIONS: Fall   RED FLAGS: None      WEIGHT BEARING RESTRICTIONS: L WBAT   FALLS:  Has  patient fallen in last 6 months? No   LIVING ENVIRONMENT:  Lives with: lives with their family Lives in: Mobile home Stairs: Yes: External: 4 steps; can reach both Has following equipment at home: [employer] - 2 wheeled and [employer] - 4 wheeled, was using walker prior to surgery Bathroom: standard toilet height, no grab bars   OCCUPATION: On disability   PLOF: Independent with household mobility without device   PATIENT GOALS: feel better and walk thru community with AD   NEXT MD VISIT: Not Scheduled   OBJECTIVE:  Note: Objective measures were completed at Evaluation unless otherwise noted.   DIAGNOSTIC FINDINGS: see imaging   PATIENT SURVEYS:  LEFS: 13/80   COGNITION: Overall cognitive status: Within functional limits for tasks assessed                         SENSATION: NT at eval    EDEMA:  NT at eval   MUSCLE LENGTH: NT at eval   POSTURE: rounded shoulders and increased thoracic kyphosis   PALPATION: NT at eval    LOWER EXTREMITY ROM:   Active ROM Right eval Left eval  Hip flexion      Hip extension  Hip abduction      Hip adduction      Hip internal rotation      Hip external rotation      Knee flexion      Knee extension      Ankle dorsiflexion      Ankle plantarflexion      Ankle inversion      Ankle eversion       (Blank rows = not tested)   LOWER EXTREMITY MMT:   MMT Right eval Left eval  Hip flexion 4    Hip extension      Hip abduction      Hip adduction      Hip internal rotation      Hip external rotation      Knee flexion 4    Knee extension 4    Ankle dorsiflexion      Ankle plantarflexion      Ankle inversion      Ankle eversion       (Blank rows = not tested)   LOWER EXTREMITY SPECIAL TESTS:      FUNCTIONAL TESTS:  30 seconds chair stand test: 5 with UE use TUG: 36 sec with 2 wheel walker   GAIT: Distance walked: 25ft Assistive device utilized: Environmental consultant - 2 wheeled Level of assistance: Modified  independence Comments: Step to gait pattern, slow and shuffling, does not pick up feet much when turning                                                                                                                                  TREATMENT:  [facility] Adult PT Treatment:                                                DATE: 04/05/24 Therapeutic Exercise:  Nustep L4 UE/LE x 5 minutes  Standing hip abduction and ext x10 Standing heel raise  Standing hamstring curl Seated LAQ 3# x 10  HL Gluteal queeze x 10 HL ball squeeze x 10  Seated heel slide with cues to actively flex hip x 10  Therapeutic Activities Standing marches STS high table x10 STS lowered table, cues to not put full weight on table x10 Light tap step ups, UE support with L leg down x1 Step ups fwd/lat in // bars x 10 ea  Gait Training Weight acceptance and stance training in // bars - 2 laps    [facility] Adult PT Treatment:                                                DATE: 03/29/24 Therapeutic Exercise: Nustep L4 UE/LE x 5 minutes  Standing hip abduction and ext  Standing left hip flexion Standing heel raise  Lateral weight shifting  at counter 5 sec x 8 Seated LAQ 3# x 10  HL Gluteal queeze x 10 HL bridge x 10  HL ball squeeze x 10  Seated QS, ankle circles          [facility] Adult PT Treatment:                                                DATE: 03/27/2024 Therapeutic Exercise:  NuStep lvl 4 UE/LE x 4 min for functional activity tolerance LAQ 2x10 2.5# STS 2x10 - high table no UE Bridge 2x10  Hooklying clamshell 2x10 GTB Standing hip abd/ext 2x10 L   [facility] Adult PT Treatment:                                                DATE: 03/24/2024 Therapeutic Exercise: QS in sitting with knee extended x10 Hook lying glute sets x10 Seated LAQ x 10 Ankle pumps in sitting x20 ea Self Care: Education on maintaining precautions, picking up feet when turning, monitoring for signs and symptoms of infection and expectations  for functional progression       PATIENT EDUCATION:  Education details: Educated on evaluation findings, expectations after surgery, posterior precautions, and monitoring for signs and symptoms of infection and when to contact her PCP. Educated on performance of HEP and POC. Educated on proper foot placement during gait and limiting  Person educated: Patient and Spouse Education method: Explanation, Demonstration, Verbal cues, and Handouts Education comprehension: verbalized understanding and returned demonstration   HOME EXERCISE PROGRAM: Access Code: [email or web address] URL: [email or web address] Date: 03/27/2024 Prepared by: Alm Kingdom   Exercises - Seated Long Arc Quad  - 3-5 x daily - 7 x weekly - 3 sets - 10 reps - Seated Ankle Pumps  - 3-5 x daily - 7 x weekly - 2 sets - 20 reps - Seated Quad Set  - 3-5 x daily - 7 x weekly - 3 sets - 10 reps - 5 sec hold - Hooklying Gluteal Sets  - 3-5 x daily - 7 x weekly - 3 sets - 10 reps - 5 sec hold - Supine Bridge  - 1 x daily - 7 x weekly - 2-3 sets - 10 reps - Hooklying Clamshell with Resistance  - 1 x daily - 7 x weekly - 2-3 sets - 10 reps - green band hold   ASSESSMENT:   CLINICAL IMPRESSION: Savannah Mcneil tolerated the session well today, experienced a mild hot flash mid session that lasted throughout, pt sat down for a few minutes and was able to finish exercises. Pt unable to perform SLR without significant anterior hip pain. Session was focused on LE strengthening and balance, standing tolerance. Savannah Mcneil is progressing well with gait and functional strength, she was able to tolerate a lot of WB exercises today and demonstrated good hip stability. Plan to assess response to treatment and HEP next session. Pt continues to benefit from skilled PT care to address her deficits and improve function during necessary tasks.   EVAL: Patient is a 58 y.o. F who was seen today 3 days s/p L THA. Assessment revealed limitation in ambulatory  status, transfers, and LE strength and mobility. Pt able to perform STS with UE use with good lowering control. TUG score of 36 seconds with 2 wheel walker indicates significant limitations in functional mobility and potential for fall risk. LEFS score of 13/80 also indicates high limitations in pts confidence and perceived ability to complete functional LE tasks. Overall pts LE functional ability is good for POD 3 but is still significantly limited compared to pts PLOF. Pt will benefit from skilled PT services to address the above functional limitations.   OBJECTIVE IMPAIRMENTS: Abnormal gait, decreased activity tolerance, decreased balance, decreased coordination, decreased mobility, and decreased strength.    ACTIVITY LIMITATIONS: carrying, lifting, bending, sitting, standing, squatting, stairs, transfers, bed mobility, bathing, toileting, and dressing   PARTICIPATION LIMITATIONS: cleaning, laundry, driving, shopping, community activity, and yard work   PERSONAL FACTORS: Fitness and 1 comorbidity: HTN are also affecting patient's functional outcome.    REHAB POTENTIAL: Good   CLINICAL DECISION MAKING: Stable/uncomplicated   EVALUATION COMPLEXITY: Low     GOALS: Goals reviewed with patient? No   SHORT TERM GOALS: Target date: 04/14/2024 Pt will be compliant and knowledgeable with HEP for improved self management of functional abilities  Baseline: HEP given at eval  Goal status: INITIAL   2.  Pt will report worst pain being 5/10 or less to improve pt comfort and functional abilities Baseline: 10/10 Goal status: INITIAL   3.  Pt will improve 30 sec sts to 7 to show progression of LE functional strength and improve pts participation in daily activities Baseline: 5 Goal status: INITIAL       LONG TERM GOALS: Target date: 06/16/2024   Pt will self report worst pain at 3/10 to improve patient comfort and increase participation in functional activities  Baseline: 10/10 Goal status:  INITIAL   2.  Pt will report normal baseline pain no more than 1/10 to shoe improved comfort and increased tolerance to functional activities  Baseline: 4/10 Goal status: INITIAL   3.  Pt will achieve 4/5 MMT for L hip flexion, abduction and L knee flexion and extension to progress pt towards PLOF and improve participation in meaningful functional activities  Baseline: Not tested at eval  Goal status: INITIAL   4.  Pt will tolerate sitting and standing for longer than 1 hour to improve comfort and patients ability to participate in functional activities  Baseline: unable to for more than 1 hour Goal status: INITIAL   5.  Pt will improve LEFS score to 30/80 to demonstrate improved functional activity and increase participation in household ADLs and community ambulation  Baseline:  Goal status: INITIAL       PLAN:   PT FREQUENCY: 1-2x/week   PT DURATION: 12 weeks   PLANNED INTERVENTIONS: 97110-Therapeutic exercises, 97530- Therapeutic activity, [id number]- Neuromuscular re-education, 97535- Self Care, and 02859- Manual therapy   PLAN FOR NEXT SESSION: Plan to assess response to HEP, LE strengthening, gait training  Holly Schneider, SPT 04/06/24 11:58 AM

## 2024-04-10 ENCOUNTER — Ambulatory Visit

## 2024-04-10 DIAGNOSIS — M25552 Pain in left hip: Secondary | ICD-10-CM

## 2024-04-10 DIAGNOSIS — M6281 Muscle weakness (generalized): Secondary | ICD-10-CM

## 2024-04-10 DIAGNOSIS — R2681 Unsteadiness on feet: Secondary | ICD-10-CM

## 2024-04-10 NOTE — Therapy (Signed)
 OUTPATIENT PHYSICAL THERAPY TREATMENT     Patient Name: Savannah Mcneil MRN: [id number] DOB:Jan 12, 1966, 58 y.o., female Today's Date: 03/24/2024   END OF SESSION:      PT End of Session - 04/10/24 1102       Visit Number 4    Number of Visits 25     Date for PT Re-Evaluation 06/16/24     Authorization Type [place] MEDICAID HEALTHY BLUE     Authorization Time Period 03/24/24-06/22/22     Authorization - Visit Number 4    Authorization - Number of Visits 13     PT Start Time 1102    PT Stop Time 1143    PT Time Calculation (min) 41     REFERRING PROVIDER: Josefina Chew, MD   REFERRING DIAG: s/p left posterior total hip replacement    THERAPY DIAG:  Pain in left hip   Muscle weakness (generalized)   Unsteadiness on feet   Rationale for Evaluation and Treatment: Rehabilitation   ONSET DATE: 03/21/2024   SUBJECTIVE:    SUBJECTIVE STATEMENT: Pt reports a ton of soreness after last session. Could barely move after last session. Got bad sleep last 2 nights s/t increased leg pain.    EVAL: Pt is a 58 yr old F referred to PT s/p L THA, posterior approach, POD 3. Pt comes in with 2 wheeled walker. Ambulating with step 2 gait pattern. Pt reports she is getting up and moving at least every hour. Pt sleeping in recliner, pain is worse at night, usually feels good in the morning. Pt reports incision is not bothering her, no excess drainage. Pt denies any fever, chills, sob or other signs of infection.    PERTINENT HISTORY: Arthritis Asthma HTN Lumbar fusion - 13 yrs ago PAIN:  Are you having pain? Yes: NPRS scale: 3/10, worst 10/10 Pain location: L hip and LE Pain description: aching Aggravating factors: standing for long periods Relieving factors: medication, limiting time in 1 position   PRECAUTIONS: Fall   RED FLAGS: None      WEIGHT BEARING RESTRICTIONS: L WBAT   FALLS:  Has patient fallen in last 6 months? No   LIVING ENVIRONMENT:  Lives with: lives with their  family Lives in: Mobile home Stairs: Yes: External: 4 steps; can reach both Has following equipment at home: Vannie - 2 wheeled and [employer] - 4 wheeled, was using walker prior to surgery Bathroom: standard toilet height, no grab bars   OCCUPATION: On disability   PLOF: Independent with household mobility without device   PATIENT GOALS: feel better and walk thru community with AD   NEXT MD VISIT: Not Scheduled   OBJECTIVE:  Note: Objective measures were completed at Evaluation unless otherwise noted.   DIAGNOSTIC FINDINGS: see imaging   PATIENT SURVEYS:  LEFS: 13/80   COGNITION: Overall cognitive status: Within functional limits for tasks assessed                         SENSATION: NT at eval    EDEMA:  NT at eval   MUSCLE LENGTH: NT at eval   POSTURE: rounded shoulders and increased thoracic kyphosis   PALPATION: NT at eval    LOWER EXTREMITY ROM:   Active ROM Right eval Left eval  Hip flexion      Hip extension      Hip abduction      Hip adduction      Hip internal rotation  Hip external rotation      Knee flexion      Knee extension      Ankle dorsiflexion      Ankle plantarflexion      Ankle inversion      Ankle eversion       (Blank rows = not tested)   LOWER EXTREMITY MMT:   MMT Right eval Left eval  Hip flexion 4    Hip extension      Hip abduction      Hip adduction      Hip internal rotation      Hip external rotation      Knee flexion 4    Knee extension 4    Ankle dorsiflexion      Ankle plantarflexion      Ankle inversion      Ankle eversion       (Blank rows = not tested)   LOWER EXTREMITY SPECIAL TESTS:      FUNCTIONAL TESTS:  30 seconds chair stand test: 5 with UE use TUG: 36 sec with 2 wheel walker   GAIT: Distance walked: 73ft Assistive device utilized: Environmental consultant - 2 wheeled Level of assistance: Modified independence Comments: Step to gait pattern, slow and shuffling, does not pick up feet much when turning                                                                                                                                   TREATMENT: [facility] Adult PT Treatment:                                                DATE: 04/10/24 Therapeutic Exercise:  Nustep L4 UE/LE x 3 minutes  Standing hip abduction and ext x10 Seated LAQ 4# x 10 6# x 10 SL clamshell with pillow bt knees 2x10  Supine glute bridge x10  HL Gluteal queeze x 10 HL ball squeeze x 15 Seated heel slide with cues to actively flex hip x 10  Manual Therapy PROM Hip flexion to 90  Therapeutic Activities STS high table x8 Light tap step ups, UE support with L leg down x1 Lateral walking   [facility] Adult PT Treatment:                                                DATE: 04/06/24 Therapeutic Exercise:  Nustep L4 UE/LE x 5 minutes  Standing hip abduction and ext x10 Standing heel raise  Standing hamstring curl Seated LAQ 3# x 10  HL Gluteal queeze x 10 HL ball squeeze x 10  Seated heel slide with cues to actively flex hip x 10  Therapeutic  Activities Standing marches STS high table x10 STS lowered table, cues to not put full weight on table x10 Light tap step ups, UE support with L leg down x1 Step ups fwd/lat in // bars x 10 ea  Gait Training Weight acceptance and stance training in // bars - 2 laps    [facility] Adult PT Treatment:                                                DATE: 03/29/24 Therapeutic Exercise: Nustep L4 UE/LE x 5 minutes  Standing hip abduction and ext  Standing left hip flexion Standing heel raise  Lateral weight shifting  at counter 5 sec x 8 Seated LAQ 3# x 10  HL Gluteal queeze x 10 HL bridge x 10  HL ball squeeze x 10  Seated QS, ankle circles          [facility] Adult PT Treatment:                                                DATE: 03/27/2024 Therapeutic Exercise:  NuStep lvl 4 UE/LE x 4 min for functional activity tolerance LAQ 2x10 2.5# STS 2x10 - high table no UE Bridge 2x10  Hooklying  clamshell 2x10 GTB Standing hip abd/ext 2x10 L   [facility] Adult PT Treatment:                                                DATE: 03/24/2024 Therapeutic Exercise: QS in sitting with knee extended x10 Hook lying glute sets x10 Seated LAQ x 10 Ankle pumps in sitting x20 ea Self Care: Education on maintaining precautions, picking up feet when turning, monitoring for signs and symptoms of infection and expectations for functional progression       PATIENT EDUCATION:  Education details: Educated on evaluation findings, expectations after surgery, posterior precautions, and monitoring for signs and symptoms of infection and when to contact her PCP. Educated on performance of HEP and POC. Educated on proper foot placement during gait and limiting  Person educated: Patient and Spouse Education method: Explanation, Demonstration, Verbal cues, and Handouts Education comprehension: verbalized understanding and returned demonstration   HOME EXERCISE PROGRAM: Access Code: [email or web address] URL: [email or web address] Date: 03/27/2024 Prepared by: Alm Kingdom   Exercises - Seated Long Arc Quad  - 3-5 x daily - 7 x weekly - 3 sets - 10 reps - Seated Ankle Pumps  - 3-5 x daily - 7 x weekly - 2 sets - 20 reps - Seated Quad Set  - 3-5 x daily - 7 x weekly - 3 sets - 10 reps - 5 sec hold - Hooklying Gluteal Sets  - 3-5 x daily - 7 x weekly - 3 sets - 10 reps - 5 sec hold - Supine Bridge  - 1 x daily - 7 x weekly - 2-3 sets - 10 reps - Hooklying Clamshell with Resistance  - 1 x daily - 7 x weekly - 2-3 sets - 10 reps - green band hold   ASSESSMENT:   CLINICAL IMPRESSION: Patient tolerated treatment well today. Discussed probably  overworking that occurred during last therapy session and led to her increased soreness over the following days, focus today was on going through HEP exercises and continuing to work on pre gait activities. SLR still not able to achieve d/t increase in back pain. Plan to  assess response to tx and continue with exercises. Pt continues to benefit from skilled PT care to address deficits in pain, Hip strength, balance, mobility to facilitate return to PLOF.   EVAL: Patient is a 58 y.o. F who was seen today 3 days s/p L THA. Assessment revealed limitation in ambulatory status, transfers, and LE strength and mobility. Pt able to perform STS with UE use with good lowering control. TUG score of 36 seconds with 2 wheel walker indicates significant limitations in functional mobility and potential for fall risk. LEFS score of 13/80 also indicates high limitations in pts confidence and perceived ability to complete functional LE tasks. Overall pts LE functional ability is good for POD 3 but is still significantly limited compared to pts PLOF. Pt will benefit from skilled PT services to address the above functional limitations.   OBJECTIVE IMPAIRMENTS: Abnormal gait, decreased activity tolerance, decreased balance, decreased coordination, decreased mobility, and decreased strength.    ACTIVITY LIMITATIONS: carrying, lifting, bending, sitting, standing, squatting, stairs, transfers, bed mobility, bathing, toileting, and dressing   PARTICIPATION LIMITATIONS: cleaning, laundry, driving, shopping, community activity, and yard work   PERSONAL FACTORS: Fitness and 1 comorbidity: HTN are also affecting patient's functional outcome.    REHAB POTENTIAL: Good   CLINICAL DECISION MAKING: Stable/uncomplicated   EVALUATION COMPLEXITY: Low     GOALS: Goals reviewed with patient? No   SHORT TERM GOALS: Target date: 04/14/2024 Pt will be compliant and knowledgeable with HEP for improved self management of functional abilities  Baseline: HEP given at eval  Goal status: INITIAL   2.  Pt will report worst pain being 5/10 or less to improve pt comfort and functional abilities Baseline: 10/10 Goal status: INITIAL   3.  Pt will improve 30 sec sts to 7 to show progression of LE  functional strength and improve pts participation in daily activities Baseline: 5 Goal status: INITIAL  4. TUG will improve to 25 sec with 2WW to show progress of functional mobility and strength  Baseline: 36 sec  Goal status: initial       LONG TERM GOALS: Target date: 06/16/2024   Pt will self report worst pain at 3/10 to improve patient comfort and increase participation in functional activities  Baseline: 10/10 Goal status: INITIAL   2.  Pt will report normal baseline pain no more than 1/10 to shoe improved comfort and increased tolerance to functional activities  Baseline: 4/10 Goal status: INITIAL   3.  Pt will achieve 4/5 MMT for L hip flexion, abduction and L knee flexion and extension to progress pt towards PLOF and improve participation in meaningful functional activities  Baseline: Not tested at eval  Goal status: INITIAL   4.  Pt will tolerate sitting and standing for longer than 1 hour to improve comfort and patients ability to participate in functional activities  Baseline: unable to for more than 1 hour Goal status: INITIAL   5.  Pt will improve LEFS score to 30/80 to demonstrate improved functional activity and increase participation in household ADLs and community ambulation  Baseline:  Goal status: INITIAL  6. TUG will improve to 20 sec without AD to show progress of functional mobility and strength and improve safety when ambulating  Baseline: 36 sec w 2WW  Goal status: INIITIAL       PLAN:   PT FREQUENCY: 1-2x/week   PT DURATION: 12 weeks   PLANNED INTERVENTIONS: 97110-Therapeutic exercises, 97530- Therapeutic activity, [id number]- Neuromuscular re-education, 97535- Self Care, and [id number]- Manual therapy   PLAN FOR NEXT SESSION: Plan to assess response to HEP, LE strengthening, gait training  Holly Schneider, SPT 04/10/24 11:43 AM

## 2024-04-12 ENCOUNTER — Ambulatory Visit

## 2024-04-12 DIAGNOSIS — M25552 Pain in left hip: Secondary | ICD-10-CM

## 2024-04-12 DIAGNOSIS — M6281 Muscle weakness (generalized): Secondary | ICD-10-CM

## 2024-04-12 DIAGNOSIS — R2681 Unsteadiness on feet: Secondary | ICD-10-CM

## 2024-04-12 NOTE — Therapy (Signed)
 OUTPATIENT PHYSICAL THERAPY TREATMENT     Patient Name: Savannah Mcneil MRN: [id number] DOB:03/11/1966, 58 y.o., female Today's Date: 03/24/2024   END OF SESSION:     PT End of Session - 04/12/24 1100     Visit Number 6    Number of Visits 25    Date for PT Re-Evaluation 06/16/24    Authorization Type [place] MEDICAID HEALTHY BLUE    Authorization Time Period 03/24/24-06/22/22    Authorization - Number of Visits 13    PT Start Time 1100    PT Stop Time 1140    PT Time Calculation (min) 40 min    Activity Tolerance Patient tolerated treatment well    Behavior During Therapy WFL for tasks assessed/performed           REFERRING PROVIDER: Josefina Chew, MD   REFERRING DIAG: s/p left posterior total hip replacement    THERAPY DIAG:  Pain in left hip   Muscle weakness (generalized)   Unsteadiness on feet   Rationale for Evaluation and Treatment: Rehabilitation   ONSET DATE: 03/21/2024   SUBJECTIVE:    SUBJECTIVE STATEMENT: Pt states feeling better each day. Has been using stairs at home a little which have been easy. Did feel some soreness 3/10 pain currently. Been sleeping porely at night, leg is throbbing   EVAL: Pt is a 58 yr old F referred to PT s/p L THA, posterior approach, POD 3. Pt comes in with 2 wheeled walker. Ambulating with step 2 gait pattern. Pt reports she is getting up and moving at least every hour. Pt sleeping in recliner, pain is worse at night, usually feels good in the morning. Pt reports incision is not bothering her, no excess drainage. Pt denies any fever, chills, sob or other signs of infection.    PERTINENT HISTORY: Arthritis Asthma HTN Lumbar fusion - 13 yrs ago PAIN:  Are you having pain? Yes: NPRS scale: 3/10, worst 10/10 Pain location: L hip and LE Pain description: aching Aggravating factors: standing for long periods Relieving factors: medication, limiting time in 1 position   PRECAUTIONS: Fall   RED FLAGS: None      WEIGHT  BEARING RESTRICTIONS: L WBAT   FALLS:  Has patient fallen in last 6 months? No   LIVING ENVIRONMENT:  Lives with: lives with their family Lives in: Mobile home Stairs: Yes: External: 4 steps; can reach both Has following equipment at home: [employer] - 2 wheeled and [employer] - 4 wheeled, was using walker prior to surgery Bathroom: standard toilet height, no grab bars   OCCUPATION: On disability   PLOF: Independent with household mobility without device   PATIENT GOALS: feel better and walk thru community with AD   NEXT MD VISIT: Not Scheduled   OBJECTIVE:  Note: Objective measures were completed at Evaluation unless otherwise noted.   DIAGNOSTIC FINDINGS: see imaging   PATIENT SURVEYS:  LEFS: 13/80   COGNITION: Overall cognitive status: Within functional limits for tasks assessed                         SENSATION: NT at eval    EDEMA:  NT at eval   MUSCLE LENGTH: NT at eval   POSTURE: rounded shoulders and increased thoracic kyphosis   PALPATION: NT at eval    LOWER EXTREMITY ROM:   Active ROM Right eval Left eval  Hip flexion      Hip extension      Hip abduction  Hip adduction      Hip internal rotation      Hip external rotation      Knee flexion      Knee extension      Ankle dorsiflexion      Ankle plantarflexion      Ankle inversion      Ankle eversion       (Blank rows = not tested)   LOWER EXTREMITY MMT:   MMT Right eval Left eval  Hip flexion 4    Hip extension      Hip abduction      Hip adduction      Hip internal rotation      Hip external rotation      Knee flexion 4    Knee extension 4    Ankle dorsiflexion      Ankle plantarflexion      Ankle inversion      Ankle eversion       (Blank rows = not tested)   LOWER EXTREMITY SPECIAL TESTS:      FUNCTIONAL TESTS:  30 seconds chair stand test: 5 with UE use TUG: 36 sec with 2 wheel walker  8/13 TUG: 14 sec w/o AD 8/13: 30 sec STS: 12 with UE use   GAIT: Distance  walked: 94ft Assistive device utilized: Environmental consultant - 2 wheeled Level of assistance: Modified independence Comments: Step to gait pattern, slow and shuffling, does not pick up feet much when turning                                                                                                                                  TREATMENT: [facility] Adult PT Treatment:                                                DATE: 04/12/24 Therapeutic Exercise:  Nustep L4 UE/LE x 4 minutes  Standing hip abduction and ext x10 Seated LAQ rtb x10 ea SL clamshell with pillow bt knees 2x10  Supine glute bridge x10  HL Gluteal queeze x 10 HL ball squeeze x 15 HL leg raise 2x 10  Therapeutic Activities STS low table x10 with UE Light tap step ups, UE support with L leg down x10 ea Step ups with UE support x 10 ea 6 Review of TUG, 30 sec STS Stepping over cones with UE support -   [facility] Adult PT Treatment:                                                DATE: 04/10/24 Therapeutic Exercise:  Nustep L4 UE/LE x 3 minutes  Standing hip abduction and ext x10 Seated  LAQ 4# x 10 6# x 10 SL clamshell with pillow bt knees 2x10  Supine glute bridge x10  HL Gluteal queeze x 10 HL ball squeeze x 15 Seated heel slide with cues to actively flex hip x 10  Manual Therapy PROM Hip flexion to 90  Therapeutic Activities STS high table x8 Light tap step ups, UE support with L leg down x1 Lateral walking   [facility] Adult PT Treatment:                                                DATE: 04/06/24 Therapeutic Exercise:  Nustep L4 UE/LE x 5 minutes  Standing hip abduction and ext x10 Standing heel raise  Standing hamstring curl Seated LAQ 3# x 10  HL Gluteal queeze x 10 HL ball squeeze x 10  Seated heel slide with cues to actively flex hip x 10  Therapeutic Activities Standing marches STS high table x10 STS lowered table, cues to not put full weight on table x10 Light tap step ups, UE support with L leg down x1 Step  ups fwd/lat in // bars x 10 ea  Gait Training Weight acceptance and stance training in // bars - 2 laps    [facility] Adult PT Treatment:                                                DATE: 03/29/24 Therapeutic Exercise: Nustep L4 UE/LE x 5 minutes  Standing hip abduction and ext  Standing left hip flexion Standing heel raise  Lateral weight shifting  at counter 5 sec x 8 Seated LAQ 3# x 10  HL Gluteal queeze x 10 HL bridge x 10  HL ball squeeze x 10  Seated QS, ankle circles          [facility] Adult PT Treatment:                                                DATE: 03/27/2024 Therapeutic Exercise:  NuStep lvl 4 UE/LE x 4 min for functional activity tolerance LAQ 2x10 2.5# STS 2x10 - high table no UE Bridge 2x10  Hooklying clamshell 2x10 GTB Standing hip abd/ext 2x10 L   [facility] Adult PT Treatment:                                                DATE: 03/24/2024 Therapeutic Exercise: QS in sitting with knee extended x10 Hook lying glute sets x10 Seated LAQ x 10 Ankle pumps in sitting x20 ea Self Care: Education on maintaining precautions, picking up feet when turning, monitoring for signs and symptoms of infection and expectations for functional progression       PATIENT EDUCATION:  Education details: Educated on evaluation findings, expectations after surgery, posterior precautions, and monitoring for signs and symptoms of infection and when to contact her PCP. Educated on performance of HEP and POC. Educated on proper foot placement during gait and limiting  Person educated: Patient  and Spouse Education method: Explanation, Demonstration, Verbal cues, and Handouts Education comprehension: verbalized understanding and returned demonstration   HOME EXERCISE PROGRAM: Access Code: [email or web address] URL: [email or web address] Date: 04/12/2024 Prepared by: Alm Kingdom Exercises - Supine March  - 1 x daily - 7 x weekly - 2 sets - 10 reps - Supine Bridge  - 1 x daily - 7 x weekly - 2-3  sets - 10 reps - Hooklying Clamshell with Resistance  - 1 x daily - 7 x weekly - 2-3 sets - 10 reps - green band hold - Sitting Knee Extension with Resistance  - 1 x daily - 7 x weekly - 3 sets - 10 reps - Sit to Stand  - 1 x daily - 7 x weekly - 2-3 sets - 10 reps - Standing Hip Abduction with Counter Support  - 1 x daily - 7 x weekly - 2 sets - 10 reps - Standing Hip Extension with Counter Support  - 1 x daily - 7 x weekly - 2 sets - 10 reps    ASSESSMENT:   CLINICAL IMPRESSION: Marinna completed all exercises today without any adverse events. Continued with strength and balance training in upright position. Pt is progressing with all exercises with good tolerance. Goals were assessed, pt has significantly improved TUG and STS scores showing continued functional improvement. Discussed stair exercises at home and talked about slowly grading exposure and assessing how she feels post exercise before increasing reps. Updated HEP to further progress strength and balance gains, reviewed these exercises today. The pt continues to benefit from skilled PT care to address remaining strength, balance, and mobility deficits.   EVAL: Patient is a 58 y.o. F who was seen today 3 days s/p L THA. Assessment revealed limitation in ambulatory status, transfers, and LE strength and mobility. Pt able to perform STS with UE use with good lowering control. TUG score of 36 seconds with 2 wheel walker indicates significant limitations in functional mobility and potential for fall risk. LEFS score of 13/80 also indicates high limitations in pts confidence and perceived ability to complete functional LE tasks. Overall pts LE functional ability is good for POD 3 but is still significantly limited compared to pts PLOF. Pt will benefit from skilled PT services to address the above functional limitations.   OBJECTIVE IMPAIRMENTS: Abnormal gait, decreased activity tolerance, decreased balance, decreased coordination, decreased mobility,  and decreased strength.    ACTIVITY LIMITATIONS: carrying, lifting, bending, sitting, standing, squatting, stairs, transfers, bed mobility, bathing, toileting, and dressing   PARTICIPATION LIMITATIONS: cleaning, laundry, driving, shopping, community activity, and yard work   PERSONAL FACTORS: Fitness and 1 comorbidity: HTN are also affecting patient's functional outcome.    REHAB POTENTIAL: Good   CLINICAL DECISION MAKING: Stable/uncomplicated   EVALUATION COMPLEXITY: Low     GOALS: Goals reviewed with patient? No   SHORT TERM GOALS: Target date: 04/14/2024 Pt will be compliant and knowledgeable with HEP for improved self management of functional abilities  Baseline: HEP given at eval  Goal status: MET   2.  Pt will report worst pain being 5/10 or less to improve pt comfort and functional abilities Baseline: 10/10 8/13: 10/10 Goal status: IN PROGRESS   3.  Pt will improve 30 sec sts to 7 to show progression of LE functional strength and improve pts participation in daily activities Baseline: 5 8/13: 12 Goal status: MET  4. TUG will improve to 25 sec with 2WW to show progress of functional mobility and strength  Baseline:  36 sec  8/13: 14 sec without AD  Goal status: MET       LONG TERM GOALS: Target date: 06/16/2024   Pt will self report worst pain at 3/10 to improve patient comfort and increase participation in functional activities  Baseline: 10/10 Goal status: INITIAL   2.  Pt will report normal baseline pain no more than 1/10 to shoe improved comfort and increased tolerance to functional activities  Baseline: 4/10 Goal status: INITIAL   3.  Pt will achieve 4/5 MMT for L hip flexion, abduction and L knee flexion and extension to progress pt towards PLOF and improve participation in meaningful functional activities  Baseline: Not tested at eval  Goal status: INITIAL   4.  Pt will tolerate sitting and standing for longer than 1 hour to improve comfort and  patients ability to participate in functional activities  Baseline: unable to for more than 1 hour Goal status: INITIAL   5.  Pt will improve LEFS score to 30/80 to demonstrate improved functional activity and increase participation in household ADLs and community ambulation  Baseline:  Goal status: INITIAL  6. TUG will improve to 20 sec without AD to show progress of functional mobility and strength and improve safety when ambulating  Baseline: 36 sec w 2WW  Goal status: INIITIAL       PLAN:   PT FREQUENCY: 1-2x/week   PT DURATION: 12 weeks   PLANNED INTERVENTIONS: 97110-Therapeutic exercises, 97530- Therapeutic activity, [id number]- Neuromuscular re-education, 97535- Self Care, and 02859- Manual therapy   PLAN FOR NEXT SESSION: Plan to assess response to HEP, LE strengthening, gait training  Holly Schneider, SPT 04/12/24 12:08 PM

## 2024-04-18 ENCOUNTER — Ambulatory Visit

## 2024-04-18 DIAGNOSIS — R2681 Unsteadiness on feet: Secondary | ICD-10-CM

## 2024-04-18 DIAGNOSIS — M6281 Muscle weakness (generalized): Secondary | ICD-10-CM

## 2024-04-18 DIAGNOSIS — M25552 Pain in left hip: Secondary | ICD-10-CM

## 2024-04-18 NOTE — Therapy (Signed)
 OUTPATIENT PHYSICAL THERAPY TREATMENT     Patient Name: Savannah Mcneil MRN: [id number] DOB:[birth year], 58 y.o., female Today's Date: 03/24/2024   END OF SESSION:     PT End of Session - 04/18/24 1529     Visit Number 7    Number of Visits 25    Date for PT Re-Evaluation 06/16/24    Authorization Type [place] [employer]ICA[employer] BLUE    Authorization Time Period 03/24/24-06/22/22    Authorization - Number of Visits 13    PT Start Time 1530    PT Stop Time 1612    PT Time Calculation (min) 42 min    Activity Tolerance Patient tolerated treatment well    Behavior During Therapy WFL for tasks assessed/performed            REFERRING PROVIDER: Josefina Chew, MD   REFERRING DIAG: s/p left posterior total hip replacement    THERAPY DIAG:  Pain in left hip   Muscle weakness (generalized)   Unsteadiness on feet   Rationale for Evaluation and Treatment: Rehabilitation   ONSET DATE: 03/21/2024   SUBJECTIVE:    SUBJECTIVE STATEMENT: Was able to stand in her shower for a while w/o pain. Starting to walk without walker some on the deck and in the kitchen. Stair exercises have been going well. Reports ease with putting socks on L foot.     EVAL: Pt is a 58 yr old F referred to PT s/p L THA, posterior approach, POD 3. Pt comes in with 2 wheeled walker. Ambulating with step 2 gait pattern. Pt reports she is getting up and moving at least every hour. Pt sleeping in recliner, pain is worse at night, usually feels good in the morning. Pt reports incision is not bothering her, no excess drainage. Pt denies any fever, chills, sob or other signs of infection.    PERTINENT HISTORY: Arthritis Asthma HTN Lumbar fusion - 13 yrs ago PAIN:  Are you having pain? Yes: NPRS scale: 3/10, worst 10/10 Pain location: L hip and LE Pain description: aching Aggravating factors: standing for long periods Relieving factors: medication, limiting time in 1 position   PRECAUTIONS: Fall   RED  FLAGS: None      WEIGHT BEARING RESTRICTIONS: L WBAT   FALLS:  Has patient fallen in last 6 months? No   LIVING ENVIRONMENT:  Lives with: lives with their family Lives in: Mobile home Stairs: Yes: External: 4 steps; can reach both Has following equipment at home: Vannie - 2 wheeled and [employer] - 4 wheeled, was using walker prior to surgery Bathroom: standard toilet height, no grab bars   OCCUPATION: On disability   PLOF: Independent with household mobility without device   PATIENT GOALS: feel better and walk thru community with AD   NEXT MD VISIT: Not Scheduled   OBJECTIVE:  Note: Objective measures were completed at Evaluation unless otherwise noted.   DIAGNOSTIC FINDINGS: see imaging   PATIENT SURVEYS:  LEFS: 13/80   COGNITION: Overall cognitive status: Within functional limits for tasks assessed                         SENSATION: NT at eval    EDEMA:  NT at eval   MUSCLE LENGTH: NT at eval   POSTURE: rounded shoulders and increased thoracic kyphosis   PALPATION: NT at eval    LOWER EXTREMITY ROM:   Active ROM Right eval Left eval  Hip flexion      Hip extension  Hip abduction      Hip adduction      Hip internal rotation      Hip external rotation      Knee flexion      Knee extension      Ankle dorsiflexion      Ankle plantarflexion      Ankle inversion      Ankle eversion       (Blank rows = not tested)   LOWER EXTREMITY MMT:   MMT Right eval Left eval  Hip flexion 4    Hip extension      Hip abduction      Hip adduction      Hip internal rotation      Hip external rotation      Knee flexion 4    Knee extension 4    Ankle dorsiflexion      Ankle plantarflexion      Ankle inversion      Ankle eversion       (Blank rows = not tested)   LOWER EXTREMITY SPECIAL TESTS:      FUNCTIONAL TESTS:  30 seconds chair stand test: 5 with UE use TUG: 36 sec with 2 wheel walker  8/13 TUG: 14 sec w/o AD 8/13: 30 sec STS: 12 with UE  use   GAIT: Distance walked: 62ft Assistive device utilized: Environmental consultant - 2 wheeled Level of assistance: Modified independence Comments: Step to gait pattern, slow and shuffling, does not pick up feet much when turning                                                                                                                                  TREATMENT: [facility] Adult PT Treatment:                                            DATE: 04/18/24 Therapeutic Exercise:  Nustep L4 UE/LE x 4 minutes  SL clamshell with pillow bt knees 2x10  Supine glute bridge x10  HL ball squeeze with glute bridge x 10 SLR 2x5 STS low table x10 with UE Standing Calf raises x 20 Standing hip abduction and ext x20 Step ups with UE support x 10 ea 6 Lateral Step ups with UE support x 10 ea 6   Neuromuscular Reeducation SLS 30 ea Backwards ambulation  Lateral stepping at counter  Light tap step ups,  x10 ea   [facility] Adult PT Treatment:                                                DATE: 04/12/24 Therapeutic Exercise:  Nustep L4 UE/LE x 4 minutes  Standing hip abduction and ext  x10 Seated LAQ rtb x10 ea SL clamshell with pillow bt knees 2x10  Supine glute bridge x10  HL Gluteal queeze x 10 HL ball squeeze x 15 HL leg raise 2x 10  Therapeutic Activities STS low table x10 with UE Light tap step ups, UE support with L leg down x10 ea Step ups with UE support x 10 ea 6 Review of TUG, 30 sec STS Stepping over cones with UE support -   [facility] Adult PT Treatment:                                                DATE: 04/10/24 Therapeutic Exercise:  Nustep L4 UE/LE x 3 minutes  Standing hip abduction and ext x10 Seated LAQ 4# x 10 6# x 10 SL clamshell with pillow bt knees 2x10  Supine glute bridge x10  HL Gluteal queeze x 10 HL ball squeeze x 15 Seated heel slide with cues to actively flex hip x 10  Manual Therapy PROM Hip flexion to 90  Therapeutic Activities STS high table x8 Light tap step ups, UE  support with L leg down x1 Lateral walking   [facility] Adult PT Treatment:                                                DATE: 04/06/24 Therapeutic Exercise:  Nustep L4 UE/LE x 5 minutes  Standing hip abduction and ext x10 Standing heel raise  Standing hamstring curl Seated LAQ 3# x 10  HL Gluteal queeze x 10 HL ball squeeze x 10  Seated heel slide with cues to actively flex hip x 10  Therapeutic Activities Standing marches STS high table x10 STS lowered table, cues to not put full weight on table x10 Light tap step ups, UE support with L leg down x1 Step ups fwd/lat in // bars x 10 ea  Gait Training Weight acceptance and stance training in // bars - 2 laps    [facility] Adult PT Treatment:                                                DATE: 03/29/24 Therapeutic Exercise: Nustep L4 UE/LE x 5 minutes  Standing hip abduction and ext  Standing left hip flexion Standing heel raise  Lateral weight shifting  at counter 5 sec x 8 Seated LAQ 3# x 10  HL Gluteal queeze x 10 HL bridge x 10  HL ball squeeze x 10  Seated QS, ankle circles          [facility] Adult PT Treatment:                                                DATE: 03/27/2024 Therapeutic Exercise:  NuStep lvl 4 UE/LE x 4 min for functional activity tolerance LAQ 2x10 2.5# STS 2x10 - high table no UE Bridge 2x10  Hooklying clamshell 2x10 GTB Standing hip abd/ext 2x10 L   [facility] Adult PT Treatment:  DATE: 03/24/2024 Therapeutic Exercise: QS in sitting with knee extended x10 Hook lying glute sets x10 Seated LAQ x 10 Ankle pumps in sitting x20 ea Self Care: Education on maintaining precautions, picking up feet when turning, monitoring for signs and symptoms of infection and expectations for functional progression       PATIENT EDUCATION:  Education details: Educated on evaluation findings, expectations after surgery, posterior precautions, and monitoring for signs and symptoms of  infection and when to contact her PCP. Educated on performance of HEP and POC. Educated on proper foot placement during gait and limiting  Person educated: Patient and Spouse Education method: Explanation, Demonstration, Verbal cues, and Handouts Education comprehension: verbalized understanding and returned demonstration   HOME EXERCISE PROGRAM: Access Code: [email or web address] URL: [email or web address] Date: 04/12/2024 Prepared by: [place] Exercises - Supine March  - 1 x daily - 7 x weekly - 2 sets - 10 reps - Supine Bridge  - 1 x daily - 7 x weekly - 2-3 sets - 10 reps - Hooklying Clamshell with Resistance  - 1 x daily - 7 x weekly - 2-3 sets - 10 reps - green band hold - Sitting Knee Extension with Resistance  - 1 x daily - 7 x weekly - 3 sets - 10 reps - Sit to Stand  - 1 x daily - 7 x weekly - 2-3 sets - 10 reps - Standing Hip Abduction with Counter Support  - 1 x daily - 7 x weekly - 2 sets - 10 reps - Standing Hip Extension with Counter Support  - 1 x daily - 7 x weekly - 2 sets - 10 reps    ASSESSMENT:   CLINICAL IMPRESSION: Felicie completed all exercises today without any increases in pain. Continued with strength and balance training in upright position. Pt is progressing with all exercises and was able to perform SLR with left leg. Worked on SL balance today, right was more challenging. Ambulation is coming along today, pt has a less significant limp without AD. The pt continues to benefit from skilled PT care to address remaining strength, balance, and mobility deficits.   EVAL: Patient is a 58 y.o. F who was seen today 3 days s/p L THA. Assessment revealed limitation in ambulatory status, transfers, and LE strength and mobility. Pt able to perform STS with UE use with good lowering control. TUG score of 36 seconds with 2 wheel walker indicates significant limitations in functional mobility and potential for fall risk. LEFS score of 13/80 also indicates high limitations in pts  confidence and perceived ability to complete functional LE tasks. Overall pts LE functional ability is good for POD 3 but is still significantly limited compared to pts PLOF. Pt will benefit from skilled PT services to address the above functional limitations.   OBJECTIVE IMPAIRMENTS: Abnormal gait, decreased activity tolerance, decreased balance, decreased coordination, decreased mobility, and decreased strength.    ACTIVITY LIMITATIONS: carrying, lifting, bending, sitting, standing, squatting, stairs, transfers, bed mobility, bathing, toileting, and dressing   PARTICIPATION LIMITATIONS: cleaning, laundry, driving, shopping, community activity, and yard work   PERSONAL FACTORS: Fitness and 1 comorbidity: HTN are also affecting patient's functional outcome.    REHAB POTENTIAL: Good   CLINICAL DECISION MAKING: Stable/uncomplicated   EVALUATION COMPLEXITY: Low     GOALS: Goals reviewed with patient? No   SHORT TERM GOALS: Target date: 04/14/2024 Pt will be compliant and knowledgeable with HEP for improved self management of functional abilities  Baseline: HEP given at eval  Goal  status: MET   2.  Pt will report worst pain being 5/10 or less to improve pt comfort and functional abilities Baseline: 10/10 8/13: 10/10 Goal status: IN PROGRESS   3.  Pt will improve 30 sec sts to 7 to show progression of LE functional strength and improve pts participation in daily activities Baseline: 5 8/13: 12 Goal status: MET  4. TUG will improve to 25 sec with 2WW to show progress of functional mobility and strength  Baseline: 36 sec  8/13: 14 sec without AD  Goal status: MET       LONG TERM GOALS: Target date: 06/16/2024   Pt will self report worst pain at 3/10 to improve patient comfort and increase participation in functional activities  Baseline: 10/10 Goal status: INITIAL   2.  Pt will report normal baseline pain no more than 1/10 to shoe improved comfort and increased tolerance to  functional activities  Baseline: 4/10 Goal status: INITIAL   3.  Pt will achieve 4/5 MMT for L hip flexion, abduction and L knee flexion and extension to progress pt towards PLOF and improve participation in meaningful functional activities  Baseline: Not tested at eval  Goal status: INITIAL   4.  Pt will tolerate sitting and standing for longer than 1 hour to improve comfort and patients ability to participate in functional activities  Baseline: unable to for more than 1 hour Goal status: INITIAL   5.  Pt will improve LEFS score to 30/80 to demonstrate improved functional activity and increase participation in household ADLs and community ambulation  Baseline:  Goal status: INITIAL  6. TUG will improve to 20 sec without AD to show progress of functional mobility and strength and improve safety when ambulating  Baseline: 36 sec w 2WW  Goal status: INIITIAL       PLAN:   PT FREQUENCY: 1-2x/week   PT DURATION: 12 weeks   PLANNED INTERVENTIONS: 97110-Therapeutic exercises, 97530- Therapeutic activity, [id number]- Neuromuscular re-education, 97535- Self Care, and [id number]- Manual therapy   PLAN FOR NEXT SESSION: Plan to assess response to HEP, LE strengthening, gait training  Holly Schneider, SPT 04/18/24 4:34 PM

## 2024-04-20 ENCOUNTER — Ambulatory Visit

## 2024-04-20 DIAGNOSIS — R2681 Unsteadiness on feet: Secondary | ICD-10-CM

## 2024-04-20 DIAGNOSIS — M25552 Pain in left hip: Secondary | ICD-10-CM

## 2024-04-20 DIAGNOSIS — M6281 Muscle weakness (generalized): Secondary | ICD-10-CM

## 2024-04-20 NOTE — Therapy (Signed)
 OUTPATIENT PHYSICAL THERAPY TREATMENT     Patient Name: Savannah Mcneil MRN: [id number] DOB:[birth year], 58 y.o., female Today's Date: 03/24/2024   END OF SESSION:     PT End of Session - 04/20/24 1107     Visit Number 8    Number of Visits 25    Date for PT Re-Evaluation 06/16/24    Authorization Type [place] MEDICA[employer] BLUE    Authorization Time Period 03/24/24-06/22/22    Authorization - Number of Visits 13    PT Start Time 1101    PT Stop Time 1145    PT Time Calculation (min) 44 min    Activity Tolerance Patient tolerated treatment well    Behavior During Therapy WFL for tasks assessed/performed             REFERRING PROVIDER: Josefina Chew, MD   REFERRING DIAG: s/p left posterior total hip replacement    THERAPY DIAG:  Pain in left hip   Muscle weakness (generalized)   Unsteadiness on feet   Rationale for Evaluation and Treatment: Rehabilitation   ONSET DATE: 03/21/2024   SUBJECTIVE:    SUBJECTIVE STATEMENT: Increased pain after last session that lasted until the next morning. Woke up with a little stiffness today but felt better.      EVAL: Pt is a 58 yr old F referred to PT s/p L THA, posterior approach, POD 3. Pt comes in with 2 wheeled walker. Ambulating with step 2 gait pattern. Pt reports she is getting up and moving at least every hour. Pt sleeping in recliner, pain is worse at night, usually feels good in the morning. Pt reports incision is not bothering her, no excess drainage. Pt denies any fever, chills, sob or other signs of infection.    PERTINENT HISTORY: Arthritis Asthma HTN Lumbar fusion - 13 yrs ago PAIN:  Are you having pain? Yes: NPRS scale: 3/10, worst 10/10 Pain location: L hip and LE Pain description: aching Aggravating factors: standing for long periods Relieving factors: medication, limiting time in 1 position   PRECAUTIONS: Fall   RED FLAGS: None      WEIGHT BEARING RESTRICTIONS: L WBAT   FALLS:  Has patient  fallen in last 6 months? No   LIVING ENVIRONMENT:  Lives with: lives with their family Lives in: Mobile home Stairs: Yes: External: 4 steps; can reach both Has following equipment at home: Vannie - 2 wheeled and [employer] - 4 wheeled, was using walker prior to surgery Bathroom: standard toilet height, no grab bars   OCCUPATION: On disability   PLOF: Independent with household mobility without device   PATIENT GOALS: feel better and walk thru community with AD   NEXT MD VISIT: Not Scheduled   OBJECTIVE:  Note: Objective measures were completed at Evaluation unless otherwise noted.   DIAGNOSTIC FINDINGS: see imaging   PATIENT SURVEYS:  LEFS: 13/80   COGNITION: Overall cognitive status: Within functional limits for tasks assessed                         SENSATION: NT at eval    EDEMA:  NT at eval   MUSCLE LENGTH: NT at eval   POSTURE: rounded shoulders and increased thoracic kyphosis   PALPATION: NT at eval    LOWER EXTREMITY ROM:   Active ROM Right eval Left eval  Hip flexion      Hip extension      Hip abduction      Hip adduction  Hip internal rotation      Hip external rotation      Knee flexion      Knee extension      Ankle dorsiflexion      Ankle plantarflexion      Ankle inversion      Ankle eversion       (Blank rows = not tested)   LOWER EXTREMITY MMT:   MMT Right eval Left eval  Hip flexion 4    Hip extension      Hip abduction      Hip adduction      Hip internal rotation      Hip external rotation      Knee flexion 4    Knee extension 4    Ankle dorsiflexion      Ankle plantarflexion      Ankle inversion      Ankle eversion       (Blank rows = not tested)   LOWER EXTREMITY SPECIAL TESTS:      FUNCTIONAL TESTS:  30 seconds chair stand test: 5 with UE use TUG: 36 sec with 2 wheel walker  8/13 TUG: 14 sec w/o AD 8/13: 30 sec STS: 12 with UE use   GAIT: Distance walked: 8ft Assistive device utilized: Environmental consultant - 2  wheeled Level of assistance: Modified independence Comments: Step to gait pattern, slow and shuffling, does not pick up feet much when turning                                                                                                                                  TREATMENT: [facility] Adult PT Treatment:                                    DATE: 04/20/24 Therapeutic Exercise:  Nustep L4 UE/LE x 4 minutes  SL clamshell with pillow bt knees x8 - stopped d/t pain  Supine glute bridge x15 HL adduction ball squeeze x 15 HL ball squeeze with glute bridge x 10 SLR x 5 elevated - pain Knee ext machine 25# 2x10 STS low table x 8  Standing Calf raises x 20  Gait training Trial of SPC - 3 laps around // bars    [facility] Adult PT Treatment:                                            DATE: 04/18/24 Therapeutic Exercise:  Nustep L4 UE/LE x 4 minutes  SL clamshell with pillow bt knees 2x10  Supine glute bridge x10  HL ball squeeze with glute bridge x 10 SLR 2x5 STS low table x10 with UE Standing Calf raises x 20 Standing hip abduction and ext x20 Step ups with UE support x 10  ea 6 Lateral Step ups with UE support x 10 ea 6   Neuromuscular Reeducation SLS 30 ea Backwards ambulation  Lateral stepping at counter  Light tap step ups,  x10 ea   [facility] Adult PT Treatment:                                                DATE: 04/12/24 Therapeutic Exercise:  Nustep L4 UE/LE x 4 minutes  Standing hip abduction and ext x10 Seated LAQ rtb x10 ea SL clamshell with pillow bt knees 2x10  Supine glute bridge x10  HL Gluteal queeze x 10 HL ball squeeze x 15 HL leg raise 2x 10  Therapeutic Activities STS low table x10 with UE Light tap step ups, UE support with L leg down x10 ea Step ups with UE support x 10 ea 6 Review of TUG, 30 sec STS Stepping over cones with UE support -   [facility] Adult PT Treatment:                                                DATE: 04/10/24 Therapeutic Exercise:   Nustep L4 UE/LE x 3 minutes  Standing hip abduction and ext x10 Seated LAQ 4# x 10 6# x 10 SL clamshell with pillow bt knees 2x10  Supine glute bridge x10  HL Gluteal queeze x 10 HL ball squeeze x 15 Seated heel slide with cues to actively flex hip x 10  Manual Therapy PROM Hip flexion to 90  Therapeutic Activities STS high table x8 Light tap step ups, UE support with L leg down x1 Lateral walking   [facility] Adult PT Treatment:                                                DATE: 04/06/24 Therapeutic Exercise:  Nustep L4 UE/LE x 5 minutes  Standing hip abduction and ext x10 Standing heel raise  Standing hamstring curl Seated LAQ 3# x 10  HL Gluteal queeze x 10 HL ball squeeze x 10  Seated heel slide with cues to actively flex hip x 10  Therapeutic Activities Standing marches STS high table x10 STS lowered table, cues to not put full weight on table x10 Light tap step ups, UE support with L leg down x1 Step ups fwd/lat in // bars x 10 ea  Gait Training Weight acceptance and stance training in // bars - 2 laps    [facility] Adult PT Treatment:                                                DATE: 03/29/24 Therapeutic Exercise: Nustep L4 UE/LE x 5 minutes  Standing hip abduction and ext  Standing left hip flexion Standing heel raise  Lateral weight shifting  at counter 5 sec x 8 Seated LAQ 3# x 10  HL Gluteal queeze x 10 HL bridge x 10  HL ball squeeze x 10  Seated QS, ankle circles  [facility] Adult PT Treatment:                                                DATE: 03/27/2024 Therapeutic Exercise:  NuStep lvl 4 UE/LE x 4 min for functional activity tolerance LAQ 2x10 2.5# STS 2x10 - high table no UE Bridge 2x10  Hooklying clamshell 2x10 GTB Standing hip abd/ext 2x10 L   [facility] Adult PT Treatment:                                                DATE: 03/24/2024 Therapeutic Exercise: QS in sitting with knee extended x10 Hook lying glute sets x10 Seated LAQ x 10 Ankle  pumps in sitting x20 ea Self Care: Education on maintaining precautions, picking up feet when turning, monitoring for signs and symptoms of infection and expectations for functional progression       PATIENT EDUCATION:  Education details: Educated on evaluation findings, expectations after surgery, posterior precautions, and monitoring for signs and symptoms of infection and when to contact her PCP. Educated on performance of HEP and POC. Educated on proper foot placement during gait and limiting  Person educated: Patient and Spouse Education method: Explanation, Demonstration, Verbal cues, and Handouts Education comprehension: verbalized understanding and returned demonstration   HOME EXERCISE PROGRAM: Access Code: [email or web address] URL: [email or web address] Date: 04/12/2024 Prepared by: Alm [place] Exercises - Supine March  - 1 x daily - 7 x weekly - 2 sets - 10 reps - Supine Bridge  - 1 x daily - 7 x weekly - 2-3 sets - 10 reps - Hooklying Clamshell with Resistance  - 1 x daily - 7 x weekly - 2-3 sets - 10 reps - green band hold - Sitting Knee Extension with Resistance  - 1 x daily - 7 x weekly - 3 sets - 10 reps - Sit to Stand  - 1 x daily - 7 x weekly - 2-3 sets - 10 reps - Standing Hip Abduction with Counter Support  - 1 x daily - 7 x weekly - 2 sets - 10 reps - Standing Hip Extension with Counter Support  - 1 x daily - 7 x weekly - 2 sets - 10 reps    ASSESSMENT:   CLINICAL IMPRESSION: Rochell tolerated today's sessions well today with some complaints of lateral/anterior hip pain throughout. Focus of session was continuation of functional strengthening and gait training with an SPC. Performed more activities in supine and sitting with her increased hip pain which she tolerated well. She demonstrated good sequencing of AD and foot placement, but her stability is questionable with a cane, still recommending her walker and working on walking without AD at home safely by countertop. The  pt continues to benefit from skilled PT to address remaining deficits and make improvements towards her goals and functional abilities.   EVAL: Patient is a 58 y.o. F who was seen today 3 days s/p L THA. Assessment revealed limitation in ambulatory status, transfers, and LE strength and mobility. Pt able to perform STS with UE use with good lowering control. TUG score of 36 seconds with 2 wheel walker indicates significant limitations in functional mobility and potential for fall risk. LEFS score of 13/80 also indicates high limitations  in pts confidence and perceived ability to complete functional LE tasks. Overall pts LE functional ability is good for POD 3 but is still significantly limited compared to pts PLOF. Pt will benefit from skilled PT services to address the above functional limitations.   OBJECTIVE IMPAIRMENTS: Abnormal gait, decreased activity tolerance, decreased balance, decreased coordination, decreased mobility, and decreased strength.    ACTIVITY LIMITATIONS: carrying, lifting, bending, sitting, standing, squatting, stairs, transfers, bed mobility, bathing, toileting, and dressing   PARTICIPATION LIMITATIONS: cleaning, laundry, driving, shopping, community activity, and yard work   PERSONAL FACTORS: Fitness and 1 comorbidity: HTN are also affecting patient's functional outcome.    REHAB POTENTIAL: Good   CLINICAL DECISION MAKING: Stable/uncomplicated   EVALUATION COMPLEXITY: Low     GOALS: Goals reviewed with patient? No   SHORT TERM GOALS: Target date: 04/14/2024 Pt will be compliant and knowledgeable with HEP for improved self management of functional abilities  Baseline: HEP given at eval  Goal status: MET   2.  Pt will report worst pain being 5/10 or less to improve pt comfort and functional abilities Baseline: 10/10 8/13: 10/10 Goal status: IN PROGRESS   3.  Pt will improve 30 sec sts to 7 to show progression of LE functional strength and improve pts  participation in daily activities Baseline: 5 8/13: 12 Goal status: MET  4. TUG will improve to 25 sec with 2WW to show progress of functional mobility and strength  Baseline: 36 sec  8/13: 14 sec without AD  Goal status: MET       LONG TERM GOALS: Target date: 06/16/2024   Pt will self report worst pain at 3/10 to improve patient comfort and increase participation in functional activities  Baseline: 10/10 Goal status: INITIAL   2.  Pt will report normal baseline pain no more than 1/10 to shoe improved comfort and increased tolerance to functional activities  Baseline: 4/10 Goal status: INITIAL   3.  Pt will achieve 4/5 MMT for L hip flexion, abduction and L knee flexion and extension to progress pt towards PLOF and improve participation in meaningful functional activities  Baseline: Not tested at eval  Goal status: INITIAL   4.  Pt will tolerate sitting and standing for longer than 1 hour to improve comfort and patients ability to participate in functional activities  Baseline: unable to for more than 1 hour Goal status: INITIAL   5.  Pt will improve LEFS score to 30/80 to demonstrate improved functional activity and increase participation in household ADLs and community ambulation  Baseline:  Goal status: INITIAL  6. TUG will improve to 20 sec without AD to show progress of functional mobility and strength and improve safety when ambulating  Baseline: 36 sec w 2WW  Goal status: INIITIAL       PLAN:   PT FREQUENCY: 1-2x/week   PT DURATION: 12 weeks   PLANNED INTERVENTIONS: 97110-Therapeutic exercises, 97530- Therapeutic activity, [id number]- Neuromuscular re-education, 97535- Self Care, and [id number]- Manual therapy   PLAN FOR NEXT SESSION: Plan to assess response to HEP, LE strengthening, gait training  Holly Schneider, SPT 04/20/24 12:05 PM

## 2024-04-25 ENCOUNTER — Ambulatory Visit: Admitting: Physical Therapy

## 2024-04-27 ENCOUNTER — Encounter: Payer: Self-pay | Admitting: Physical Therapy

## 2024-04-27 ENCOUNTER — Ambulatory Visit: Admitting: Physical Therapy

## 2024-04-27 DIAGNOSIS — M25552 Pain in left hip: Secondary | ICD-10-CM | POA: Diagnosis not present

## 2024-04-27 DIAGNOSIS — M6281 Muscle weakness (generalized): Secondary | ICD-10-CM

## 2024-04-27 DIAGNOSIS — R2681 Unsteadiness on feet: Secondary | ICD-10-CM

## 2024-04-27 NOTE — Therapy (Signed)
 OUTPATIENT PHYSICAL THERAPY TREATMENT     Patient Name: Savannah Mcneil MRN: [id number] DOB:1966/01/21, 58 y.o., female Today's Date: 03/24/2024   END OF SESSION:     PT End of Session - 04/27/24 0921     Visit Number 9    Number of Visits 25    Date for PT Re-Evaluation 06/16/24    Authorization Type [place] MEDICAID HEALTHY BLUE    Authorization Time Period 03/24/24-06/22/22    Authorization - Visit Number 9    Authorization - Number of Visits 13    PT Start Time 0922    PT Stop Time 1015    PT Time Calculation (min) 53 min    Equipment Utilized During Treatment Gait belt    Activity Tolerance Patient tolerated treatment well    Behavior During Therapy WFL for tasks assessed/performed              REFERRING PROVIDER: Josefina Chew, MD   REFERRING DIAG: s/p left posterior total hip replacement    THERAPY DIAG:  Pain in left hip   Muscle weakness (generalized)   Unsteadiness on feet   Rationale for Evaluation and Treatment: Rehabilitation   ONSET DATE: 03/21/2024   SUBJECTIVE:    SUBJECTIVE STATEMENT: 04/27/2024   I am doing pretty good, it does give me some ache at night.   EVAL: Pt is a 58 yr old F referred to PT s/p L THA, posterior approach, POD 3. Pt comes in with 2 wheeled walker. Ambulating with step 2 gait pattern. Pt reports she is getting up and moving at least every hour. Pt sleeping in recliner, pain is worse at night, usually feels good in the morning. Pt reports incision is not bothering her, no excess drainage. Pt denies any fever, chills, sob or other signs of infection.    PERTINENT HISTORY: Arthritis Asthma HTN Lumbar fusion - 13 yrs ago PAIN:  Are you having pain? Yes: NPRS scale: 0/10, worst 10/10 Pain location: L hip and LE Pain description: aching Aggravating factors: standing for long periods Relieving factors: medication, limiting time in 1 position   PRECAUTIONS: Fall   RED FLAGS: None      WEIGHT BEARING RESTRICTIONS: L  WBAT   FALLS:  Has patient fallen in last 6 months? No   LIVING ENVIRONMENT:  Lives with: lives with their family Lives in: Mobile home Stairs: Yes: External: 4 steps; can reach both Has following equipment at home: [employer] - 2 wheeled and [employer] - 4 wheeled, was using walker prior to surgery Bathroom: standard toilet height, no grab bars   OCCUPATION: On disability   PLOF: Independent with household mobility without device   PATIENT GOALS: feel better and walk thru community with AD   NEXT MD VISIT: Not Scheduled   OBJECTIVE:  Note: Objective measures were completed at Evaluation unless otherwise noted.   DIAGNOSTIC FINDINGS: see imaging   PATIENT SURVEYS:  LEFS: 13/80   COGNITION: Overall cognitive status: Within functional limits for tasks assessed                         SENSATION: NT at eval    EDEMA:  NT at eval   MUSCLE LENGTH: NT at eval   POSTURE: rounded shoulders and increased thoracic kyphosis   PALPATION: NT at eval    LOWER EXTREMITY ROM:   Active ROM Right eval Left eval  Hip flexion      Hip extension      Hip  abduction      Hip adduction      Hip internal rotation      Hip external rotation      Knee flexion      Knee extension      Ankle dorsiflexion      Ankle plantarflexion      Ankle inversion      Ankle eversion       (Blank rows = not tested)   LOWER EXTREMITY MMT:   MMT Right eval Left eval  Hip flexion 4    Hip extension      Hip abduction      Hip adduction      Hip internal rotation      Hip external rotation      Knee flexion 4    Knee extension 4    Ankle dorsiflexion      Ankle plantarflexion      Ankle inversion      Ankle eversion       (Blank rows = not tested)   LOWER EXTREMITY SPECIAL TESTS:      FUNCTIONAL TESTS:  30 seconds chair stand test: 5 with UE use TUG: 36 sec with 2 wheel walker  8/13 TUG: 14 sec w/o AD 8/13: 30 sec STS: 12 with UE use   GAIT: Distance walked: 39ft Assistive  device utilized: Environmental consultant - 2 wheeled Level of assistance: Modified independence Comments: Step to gait pattern, slow and shuffling, does not pick up feet much when turning                                                                                                                                  TREATMENT: [facility] Adult PT Treatment:                                                DATE: 04/27/24 Nu-step L 5 x 6 min LE only Gait training with SPC FWD/BWD walking in // x 5 Standing hip abduction bil with GTB 2 x 12 Stepping over hurdles with SPC in // x 6 Leg press 1 x 10 40#, 1 x 10 60# Step ups on 6 inch step with SPC Standing marching in place with single hand hold on SPC 2 x 15  [facility] Adult PT Treatment:                                    DATE: 04/20/24 Therapeutic Exercise:  Nustep L4 UE/LE x 4 minutes  SL clamshell with pillow bt knees x8 - stopped d/t pain  Supine glute bridge x15 HL adduction ball squeeze x 15 HL ball squeeze with glute bridge x 10 SLR x 5 elevated - pain Knee ext machine  25# 2x10 STS low table x 8  Standing Calf raises x 20  Gait training Trial of SPC - 3 laps around // bars    [facility] Adult PT Treatment:                                            DATE: 04/18/24 Therapeutic Exercise:  Nustep L4 UE/LE x 4 minutes  SL clamshell with pillow bt knees 2x10  Supine glute bridge x10  HL ball squeeze with glute bridge x 10 SLR 2x5 STS low table x10 with UE Standing Calf raises x 20 Standing hip abduction and ext x20 Step ups with UE support x 10 ea 6 Lateral Step ups with UE support x 10 ea 6   Neuromuscular Reeducation SLS 30 ea Backwards ambulation  Lateral stepping at counter  Light tap step ups,  x10 ea   [facility] Adult PT Treatment:                                                DATE: 04/12/24 Therapeutic Exercise:  Nustep L4 UE/LE x 4 minutes  Standing hip abduction and ext x10 Seated LAQ rtb x10 ea SL clamshell with pillow bt knees 2x10  Supine  glute bridge x10  HL Gluteal queeze x 10 HL ball squeeze x 15 HL leg raise 2x 10  Therapeutic Activities STS low table x10 with UE Light tap step ups, UE support with L leg down x10 ea Step ups with UE support x 10 ea 6 Review of TUG, 30 sec STS Stepping over cones with UE support -     PATIENT EDUCATION:  Education details: Educated on evaluation findings, expectations after surgery, posterior precautions, and monitoring for signs and symptoms of infection and when to contact her PCP. Educated on performance of HEP and POC. Educated on proper foot placement during gait and limiting  Person educated: Patient and Spouse Education method: Explanation, Demonstration, Verbal cues, and Handouts Education comprehension: verbalized understanding and returned demonstration   HOME EXERCISE PROGRAM: Access Code: [email or web address] URL: [email or web address] Date: 04/12/2024 Prepared by: Alm Kingdom Exercises - Supine March  - 1 x daily - 7 x weekly - 2 sets - 10 reps - Supine Bridge  - 1 x daily - 7 x weekly - 2-3 sets - 10 reps - Hooklying Clamshell with Resistance  - 1 x daily - 7 x weekly - 2-3 sets - 10 reps - green band hold - Sitting Knee Extension with Resistance  - 1 x daily - 7 x weekly - 3 sets - 10 reps - Sit to Stand  - 1 x daily - 7 x weekly - 2-3 sets - 10 reps - Standing Hip Abduction with Counter Support  - 1 x daily - 7 x weekly - 2 sets - 10 reps - Standing Hip Extension with Counter Support  - 1 x daily - 7 x weekly - 2 sets - 10 reps    ASSESSMENT:   CLINICAL IMPRESSION: 8/28/2025Mrs Avans arrives to PT with report of no pain today but does note some ache when she is laying in bed. Continued working on gait training using SPC and gross hip strengthening which she did well with. She was able to complete prescribed exercises with  increased reps/ weight to promote strength as well as endurnace. Encouraged getting a SPC to assist with stability and progression of her  function.    EVAL: Patient is a 58 y.o. F who was seen today 3 days s/p L THA. Assessment revealed limitation in ambulatory status, transfers, and LE strength and mobility. Pt able to perform STS with UE use with good lowering control. TUG score of 36 seconds with 2 wheel walker indicates significant limitations in functional mobility and potential for fall risk. LEFS score of 13/80 also indicates high limitations in pts confidence and perceived ability to complete functional LE tasks. Overall pts LE functional ability is good for POD 3 but is still significantly limited compared to pts PLOF. Pt will benefit from skilled PT services to address the above functional limitations.   OBJECTIVE IMPAIRMENTS: Abnormal gait, decreased activity tolerance, decreased balance, decreased coordination, decreased mobility, and decreased strength.    ACTIVITY LIMITATIONS: carrying, lifting, bending, sitting, standing, squatting, stairs, transfers, bed mobility, bathing, toileting, and dressing   PARTICIPATION LIMITATIONS: cleaning, laundry, driving, shopping, community activity, and yard work   PERSONAL FACTORS: Fitness and 1 comorbidity: HTN are also affecting patient's functional outcome.    REHAB POTENTIAL: Good   CLINICAL DECISION MAKING: Stable/uncomplicated   EVALUATION COMPLEXITY: Low     GOALS: Goals reviewed with patient? No   SHORT TERM GOALS: Target date: 04/14/2024 Pt will be compliant and knowledgeable with HEP for improved self management of functional abilities  Baseline: HEP given at eval  Goal status: MET   2.  Pt will report worst pain being 5/10 or less to improve pt comfort and functional abilities Baseline: 10/10 8/13: 10/10 Goal status: IN PROGRESS   3.  Pt will improve 30 sec sts to 7 to show progression of LE functional strength and improve pts participation in daily activities Baseline: 5 8/13: 12 Goal status: MET  4. TUG will improve to 25 sec with 2WW to show progress of  functional mobility and strength  Baseline: 36 sec  8/13: 14 sec without AD  Goal status: MET       LONG TERM GOALS: Target date: 06/16/2024   Pt will self report worst pain at 3/10 to improve patient comfort and increase participation in functional activities  Baseline: 10/10 Goal status: INITIAL   2.  Pt will report normal baseline pain no more than 1/10 to shoe improved comfort and increased tolerance to functional activities  Baseline: 4/10 Goal status: INITIAL   3.  Pt will achieve 4/5 MMT for L hip flexion, abduction and L knee flexion and extension to progress pt towards PLOF and improve participation in meaningful functional activities  Baseline: Not tested at eval  Goal status: INITIAL   4.  Pt will tolerate sitting and standing for longer than 1 hour to improve comfort and patients ability to participate in functional activities  Baseline: unable to for more than 1 hour Goal status: INITIAL   5.  Pt will improve LEFS score to 30/80 to demonstrate improved functional activity and increase participation in household ADLs and community ambulation  Baseline:  Goal status: INITIAL  6. TUG will improve to 20 sec without AD to show progress of functional mobility and strength and improve safety when ambulating  Baseline: 36 sec w 2WW  Goal status: INIITIAL       PLAN:   PT FREQUENCY: 1-2x/week   PT DURATION: 12 weeks   PLANNED INTERVENTIONS: 97110-Therapeutic exercises, 97530- Therapeutic activity, [id number]- Neuromuscular re-education, 97535-  Self Care, and 02859- Manual therapy   PLAN FOR NEXT SESSION: Plan to assess response to HEP, LE strengthening, gait training, did she get a SPC?  Karson Reede PT, DPT, LAT, ATC  04/27/24  10:20 AM

## 2024-05-02 ENCOUNTER — Ambulatory Visit: Attending: Orthopedic Surgery

## 2024-05-02 DIAGNOSIS — M25552 Pain in left hip: Secondary | ICD-10-CM | POA: Insufficient documentation

## 2024-05-02 DIAGNOSIS — M6281 Muscle weakness (generalized): Secondary | ICD-10-CM | POA: Diagnosis present

## 2024-05-02 DIAGNOSIS — R2681 Unsteadiness on feet: Secondary | ICD-10-CM | POA: Insufficient documentation

## 2024-05-02 NOTE — Therapy (Signed)
 OUTPATIENT PHYSICAL THERAPY TREATMENT     Patient Name: Savannah Mcneil MRN: [id number] DOB:1965-11-04, 58 y.o., female Today's Date: 03/24/2024   END OF SESSION:     PT End of Session - 05/02/24 1152     Visit Number 10    Number of Visits 25    Date for PT Re-Evaluation 06/16/24    Authorization Type [place] MEDICAID HEALTHY BLUE    Authorization Time Period 03/24/24-06/22/22    Authorization - Visit Number 10    Authorization - Number of Visits 13    PT Start Time 1148    PT Stop Time 1226    PT Time Calculation (min) 38 min    Equipment Utilized During Treatment Gait belt    Activity Tolerance Patient tolerated treatment well    Behavior During Therapy WFL for tasks assessed/performed               REFERRING PROVIDER: Josefina Chew, MD   REFERRING DIAG: s/p left posterior total hip replacement    THERAPY DIAG:  Pain in left hip   Muscle weakness (generalized)   Unsteadiness on feet   Rationale for Evaluation and Treatment: Rehabilitation   ONSET DATE: 03/21/2024   SUBJECTIVE:    SUBJECTIVE STATEMENT: Pt presents to PT with reports of soreness after last session, L hip is feeling good her R one she wants to discuss getting it replaced with Dr. Josefina tomorrow.    EVAL: Pt is a 58 yr old F referred to PT s/p L THA, posterior approach, POD 3. Pt comes in with 2 wheeled walker. Ambulating with step 2 gait pattern. Pt reports she is getting up and moving at least every hour. Pt sleeping in recliner, pain is worse at night, usually feels good in the morning. Pt reports incision is not bothering her, no excess drainage. Pt denies any fever, chills, sob or other signs of infection.    PERTINENT HISTORY: Arthritis Asthma HTN Lumbar fusion - 13 yrs ago PAIN:  Are you having pain? Yes: NPRS scale: 0/10, worst 10/10 Pain location: L hip and LE Pain description: aching Aggravating factors: standing for long periods Relieving factors: medication, limiting time in 1  position   PRECAUTIONS: Fall   RED FLAGS: None      WEIGHT BEARING RESTRICTIONS: L WBAT   FALLS:  Has patient fallen in last 6 months? No   LIVING ENVIRONMENT:  Lives with: lives with their family Lives in: Mobile home Stairs: Yes: External: 4 steps; can reach both Has following equipment at home: [employer] - 2 wheeled and [employer] - 4 wheeled, was using walker prior to surgery Bathroom: standard toilet height, no grab bars   OCCUPATION: On disability   PLOF: Independent with household mobility without device   PATIENT GOALS: feel better and walk thru community with AD   NEXT MD VISIT: Not Scheduled   OBJECTIVE:  Note: Objective measures were completed at Evaluation unless otherwise noted.   DIAGNOSTIC FINDINGS: see imaging   PATIENT SURVEYS:  LEFS: 13/80   COGNITION: Overall cognitive status: Within functional limits for tasks assessed                         SENSATION: NT at eval    EDEMA:  NT at eval   MUSCLE LENGTH: NT at eval   POSTURE: rounded shoulders and increased thoracic kyphosis   PALPATION: NT at eval    LOWER EXTREMITY ROM:   Active ROM Right eval Left eval  Hip flexion      Hip extension      Hip abduction      Hip adduction      Hip internal rotation      Hip external rotation      Knee flexion      Knee extension      Ankle dorsiflexion      Ankle plantarflexion      Ankle inversion      Ankle eversion       (Blank rows = not tested)   LOWER EXTREMITY MMT:   MMT Right eval Left eval  Hip flexion 4    Hip extension      Hip abduction      Hip adduction      Hip internal rotation      Hip external rotation      Knee flexion 4    Knee extension 4    Ankle dorsiflexion      Ankle plantarflexion      Ankle inversion      Ankle eversion       (Blank rows = not tested)   LOWER EXTREMITY SPECIAL TESTS:      FUNCTIONAL TESTS:  30 seconds chair stand test: 5 with UE use TUG: 36 sec with 2 wheel walker  8/13 TUG: 14 sec  w/o AD 8/13: 30 sec STS: 12 with UE use   GAIT: Distance walked: 45ft Assistive device utilized: Environmental consultant - 2 wheeled Level of assistance: Modified independence Comments: Step to gait pattern, slow and shuffling, does not pick up feet much when turning                                                                                                                                  TREATMENT: [facility] Adult PT Treatment:                                                DATE: 05/02/24 Nu-step L5 x 4 min U/LE for functional activity tolerance Standing hip abd/ext x 15 ea Gait training with SPC x 231ft CGA Seated knee ext 2x10 15lb Seated knee flex 2x10 25lb Cybex leg press 3x8 40lb Step ups 8in step 1 UE support L 2x10 STS 2x10 10lb KB  [facility] Adult PT Treatment:                                                DATE: 04/27/24 Nu-step L 5 x 6 min LE only Gait training with SPC FWD/BWD walking in // x 5 Standing hip abduction bil with GTB 2 x 12 Stepping over hurdles with SPC in // x 6 Leg press  1 x 10 40#, 1 x 10 60# Step ups on 6 inch step with SPC Standing marching in place with single hand hold on SPC 2 x 15  [facility] Adult PT Treatment:                                    DATE: 04/20/24 Therapeutic Exercise:  Nustep L4 UE/LE x 4 minutes  SL clamshell with pillow bt knees x8 - stopped d/t pain  Supine glute bridge x15 HL adduction ball squeeze x 15 HL ball squeeze with glute bridge x 10 SLR x 5 elevated - pain Knee ext machine 25# 2x10 STS low table x 8  Standing Calf raises x 20  Gait training Trial of SPC - 3 laps around // bars    [facility] Adult PT Treatment:                                            DATE: 04/18/24 Therapeutic Exercise:  Nustep L4 UE/LE x 4 minutes  SL clamshell with pillow bt knees 2x10  Supine glute bridge x10  HL ball squeeze with glute bridge x 10 SLR 2x5 STS low table x10 with UE Standing Calf raises x 20 Standing hip abduction and ext x20 Step ups with UE  support x 10 ea 6 Lateral Step ups with UE support x 10 ea 6   Neuromuscular Reeducation SLS 30 ea Backwards ambulation  Lateral stepping at counter  Light tap step ups,  x10 ea   [facility] Adult PT Treatment:                                                DATE: 04/12/24 Therapeutic Exercise:  Nustep L4 UE/LE x 4 minutes  Standing hip abduction and ext x10 Seated LAQ rtb x10 ea SL clamshell with pillow bt knees 2x10  Supine glute bridge x10  HL Gluteal queeze x 10 HL ball squeeze x 15 HL leg raise 2x 10  Therapeutic Activities STS low table x10 with UE Light tap step ups, UE support with L leg down x10 ea Step ups with UE support x 10 ea 6 Review of TUG, 30 sec STS Stepping over cones with UE support -     PATIENT EDUCATION:  Education details: Educated on evaluation findings, expectations after surgery, posterior precautions, and monitoring for signs and symptoms of infection and when to contact her PCP. Educated on performance of HEP and POC. Educated on proper foot placement during gait and limiting  Person educated: Patient and Spouse Education method: Explanation, Demonstration, Verbal cues, and Handouts Education comprehension: verbalized understanding and returned demonstration   HOME EXERCISE PROGRAM: Access Code: [email or web address] URL: [email or web address] Date: 04/12/2024 Prepared by: Alm Kingdom Exercises - Supine March  - 1 x daily - 7 x weekly - 2 sets - 10 reps - Supine Bridge  - 1 x daily - 7 x weekly - 2-3 sets - 10 reps - Hooklying Clamshell with Resistance  - 1 x daily - 7 x weekly - 2-3 sets - 10 reps - green band hold - Sitting Knee Extension with Resistance  - 1 x daily - 7 x weekly - 3 sets -  10 reps - Sit to Stand  - 1 x daily - 7 x weekly - 2-3 sets - 10 reps - Standing Hip Abduction with Counter Support  - 1 x daily - 7 x weekly - 2 sets - 10 reps - Standing Hip Extension with Counter Support  - 1 x daily - 7 x weekly - 2 sets - 10 reps     ASSESSMENT:   CLINICAL IMPRESSION: Pt was able to complete prescribed exercises with no adverse effect, did demonstrate decrease in functional activity tolerance today but noted she was feeling a bit under the weather. Today we continued to work on gait with SPC and improving LE strength and functional mobility post THA/ She is progressing well with therapy, will continue per POC and incorporate findings from MD next session.    EVAL: Patient is a 58 y.o. F who was seen today 3 days s/p L THA. Assessment revealed limitation in ambulatory status, transfers, and LE strength and mobility. Pt able to perform STS with UE use with good lowering control. TUG score of 36 seconds with 2 wheel walker indicates significant limitations in functional mobility and potential for fall risk. LEFS score of 13/80 also indicates high limitations in pts confidence and perceived ability to complete functional LE tasks. Overall pts LE functional ability is good for POD 3 but is still significantly limited compared to pts PLOF. Pt will benefit from skilled PT services to address the above functional limitations.   OBJECTIVE IMPAIRMENTS: Abnormal gait, decreased activity tolerance, decreased balance, decreased coordination, decreased mobility, and decreased strength.    ACTIVITY LIMITATIONS: carrying, lifting, bending, sitting, standing, squatting, stairs, transfers, bed mobility, bathing, toileting, and dressing   PARTICIPATION LIMITATIONS: cleaning, laundry, driving, shopping, community activity, and yard work   PERSONAL FACTORS: Fitness and 1 comorbidity: HTN are also affecting patient's functional outcome.    REHAB POTENTIAL: Good   CLINICAL DECISION MAKING: Stable/uncomplicated   EVALUATION COMPLEXITY: Low     GOALS: Goals reviewed with patient? No   SHORT TERM GOALS: Target date: 04/14/2024 Pt will be compliant and knowledgeable with HEP for improved self management of functional abilities  Baseline: HEP  given at eval  Goal status: MET   2.  Pt will report worst pain being 5/10 or less to improve pt comfort and functional abilities Baseline: 10/10 8/13: 10/10 Goal status: IN PROGRESS   3.  Pt will improve 30 sec sts to 7 to show progression of LE functional strength and improve pts participation in daily activities Baseline: 5 8/13: 12 Goal status: MET  4. TUG will improve to 25 sec with 2WW to show progress of functional mobility and strength  Baseline: 36 sec  8/13: 14 sec without AD  Goal status: MET       LONG TERM GOALS: Target date: 06/16/2024   Pt will self report worst pain at 3/10 to improve patient comfort and increase participation in functional activities  Baseline: 10/10 Goal status: INITIAL   2.  Pt will report normal baseline pain no more than 1/10 to shoe improved comfort and increased tolerance to functional activities  Baseline: 4/10 Goal status: INITIAL   3.  Pt will achieve 4/5 MMT for L hip flexion, abduction and L knee flexion and extension to progress pt towards PLOF and improve participation in meaningful functional activities  Baseline: Not tested at eval  Goal status: INITIAL   4.  Pt will tolerate sitting and standing for longer than 1 hour to improve comfort and  patients ability to participate in functional activities  Baseline: unable to for more than 1 hour Goal status: INITIAL   5.  Pt will improve LEFS score to 30/80 to demonstrate improved functional activity and increase participation in household ADLs and community ambulation  Baseline:  Goal status: INITIAL  6. TUG will improve to 20 sec without AD to show progress of functional mobility and strength and improve safety when ambulating  Baseline: 36 sec w 2WW  Goal status: INIITIAL       PLAN:   PT FREQUENCY: 1-2x/week   PT DURATION: 12 weeks   PLANNED INTERVENTIONS: 97110-Therapeutic exercises, 97530- Therapeutic activity, [id number]- Neuromuscular re-education, 97535- Self Care,  and 02859- Manual therapy   PLAN FOR NEXT SESSION: Plan to assess response to HEP, LE strengthening, gait training, did she get a SPC?  Alm JAYSON Kingdom PT  05/02/24 1:56 PM

## 2024-05-04 ENCOUNTER — Ambulatory Visit

## 2024-05-04 DIAGNOSIS — R2681 Unsteadiness on feet: Secondary | ICD-10-CM

## 2024-05-04 DIAGNOSIS — M25552 Pain in left hip: Secondary | ICD-10-CM | POA: Diagnosis not present

## 2024-05-04 DIAGNOSIS — M6281 Muscle weakness (generalized): Secondary | ICD-10-CM

## 2024-05-04 NOTE — Therapy (Signed)
 OUTPATIENT PHYSICAL THERAPY TREATMENT     Patient Name: Savannah Mcneil MRN: [id number] DOB:1966/05/09, 58 y.o., female Today's Date: 03/24/2024   END OF SESSION:     PT End of Session - 05/04/24 1031     Visit Number 11    Number of Visits 25    Date for PT Re-Evaluation 06/16/24    Authorization Type [place] MEDICAID HEALTHY BLUE    Authorization Time Period 03/24/24-06/22/22    Authorization - Visit Number 11    Authorization - Number of Visits 13    PT Start Time 1016    PT Stop Time 1055    PT Time Calculation (min) 39 min    Equipment Utilized During Treatment Gait belt    Activity Tolerance Patient tolerated treatment well    Behavior During Therapy WFL for tasks assessed/performed                REFERRING PROVIDER: Josefina Chew, MD   REFERRING DIAG: s/p left posterior total hip replacement    THERAPY DIAG:  Pain in left hip   Muscle weakness (generalized)   Unsteadiness on feet   Rationale for Evaluation and Treatment: Rehabilitation   ONSET DATE: 03/21/2024   SUBJECTIVE:    SUBJECTIVE STATEMENT: Pt presents to PT with muscle soreness but notes her L hip is getting better and stronger. Had a good f/u visit with Dr. Josefina.    EVAL: Pt is a 58 yr old F referred to PT s/p L THA, posterior approach, POD 3. Pt comes in with 2 wheeled walker. Ambulating with step 2 gait pattern. Pt reports she is getting up and moving at least every hour. Pt sleeping in recliner, pain is worse at night, usually feels good in the morning. Pt reports incision is not bothering her, no excess drainage. Pt denies any fever, chills, sob or other signs of infection.    PERTINENT HISTORY: Arthritis Asthma HTN Lumbar fusion - 13 yrs ago PAIN:  Are you having pain? Yes: NPRS scale: 0/10, worst 10/10 Pain location: L hip and LE Pain description: aching Aggravating factors: standing for long periods Relieving factors: medication, limiting time in 1 position   PRECAUTIONS:  Fall   RED FLAGS: None      WEIGHT BEARING RESTRICTIONS: L WBAT   FALLS:  Has patient fallen in last 6 months? No   LIVING ENVIRONMENT:  Lives with: lives with their family Lives in: Mobile home Stairs: Yes: External: 4 steps; can reach both Has following equipment at home: [employer] - 2 wheeled and [employer] - 4 wheeled, was using walker prior to surgery Bathroom: standard toilet height, no grab bars   OCCUPATION: On disability   PLOF: Independent with household mobility without device   PATIENT GOALS: feel better and walk thru community with AD   NEXT MD VISIT: Not Scheduled   OBJECTIVE:  Note: Objective measures were completed at Evaluation unless otherwise noted.   DIAGNOSTIC FINDINGS: see imaging   PATIENT SURVEYS:  LEFS: 13/80   COGNITION: Overall cognitive status: Within functional limits for tasks assessed                         SENSATION: NT at eval    EDEMA:  NT at eval   MUSCLE LENGTH: NT at eval   POSTURE: rounded shoulders and increased thoracic kyphosis   PALPATION: NT at eval    LOWER EXTREMITY ROM:   Active ROM Right eval Left eval  Hip flexion  Hip extension      Hip abduction      Hip adduction      Hip internal rotation      Hip external rotation      Knee flexion      Knee extension      Ankle dorsiflexion      Ankle plantarflexion      Ankle inversion      Ankle eversion       (Blank rows = not tested)   LOWER EXTREMITY MMT:   MMT Right eval Left eval  Hip flexion 4    Hip extension      Hip abduction      Hip adduction      Hip internal rotation      Hip external rotation      Knee flexion 4    Knee extension 4    Ankle dorsiflexion      Ankle plantarflexion      Ankle inversion      Ankle eversion       (Blank rows = not tested)   LOWER EXTREMITY SPECIAL TESTS:      FUNCTIONAL TESTS:  30 seconds chair stand test: 5 with UE use TUG: 36 sec with 2 wheel walker  8/13 TUG: 14 sec w/o AD 8/13: 30 sec STS:  12 with UE use   GAIT: Distance walked: 52ft Assistive device utilized: Environmental consultant - 2 wheeled Level of assistance: Modified independence Comments: Step to gait pattern, slow and shuffling, does not pick up feet much when turning                                                                                                                                  TREATMENT: [facility] Adult PT Treatment:                                                DATE: 05/04/24 Nu-step L5 x 4 min U/LE for functional activity tolerance STS 2x10 10lb Gait training with SPC x 249ft CGA Seated knee ext 2x10 15lb Seated knee flex 2x10 35lb Lateral walk RTB x 3 laps at counter Standing hip abd/ext 2x10 RTB Cybex leg press 3x8 60lb Step ups 10in step 1 UE support L 2x10 Step up lateral 8in BUE support x 10  [facility] Adult PT Treatment:                                                DATE: 05/02/24 Nu-step L5 x 4 min U/LE for functional activity tolerance Standing hip abd/ext x 15 ea Gait training with SPC x 249ft CGA Seated knee ext 2x10 15lb Seated knee flex  2x10 25lb Cybex leg press 3x8 40lb Step ups 8in step 1 UE support L 2x10 STS 2x10 10lb KB  [facility] Adult PT Treatment:                                                DATE: 04/27/24 Nu-step L 5 x 6 min LE only Gait training with SPC FWD/BWD walking in // x 5 Standing hip abduction bil with GTB 2 x 12 Stepping over hurdles with SPC in // x 6 Leg press 1 x 10 40#, 1 x 10 60# Step ups on 6 inch step with SPC Standing marching in place with single hand hold on SPC 2 x 15  [facility] Adult PT Treatment:                                    DATE: 04/20/24 Therapeutic Exercise:  Nustep L4 UE/LE x 4 minutes  SL clamshell with pillow bt knees x8 - stopped d/t pain  Supine glute bridge x15 HL adduction ball squeeze x 15 HL ball squeeze with glute bridge x 10 SLR x 5 elevated - pain Knee ext machine 25# 2x10 STS low table x 8  Standing Calf raises x 20  Gait training Trial of  SPC - 3 laps around // bars     PATIENT EDUCATION:  Education details: Educated on evaluation findings, expectations after surgery, posterior precautions, and monitoring for signs and symptoms of infection and when to contact her PCP. Educated on performance of HEP and POC. Educated on proper foot placement during gait and limiting  Person educated: Patient and Spouse Education method: Explanation, Demonstration, Verbal cues, and Handouts Education comprehension: verbalized understanding and returned demonstration   HOME EXERCISE PROGRAM: Access Code: [email or web address] URL: [email or web address] Date: 04/12/2024 Prepared by: Alm Kingdom Exercises - Supine March  - 1 x daily - 7 x weekly - 2 sets - 10 reps - Supine Bridge  - 1 x daily - 7 x weekly - 2-3 sets - 10 reps - Hooklying Clamshell with Resistance  - 1 x daily - 7 x weekly - 2-3 sets - 10 reps - green band hold - Sitting Knee Extension with Resistance  - 1 x daily - 7 x weekly - 3 sets - 10 reps - Sit to Stand  - 1 x daily - 7 x weekly - 2-3 sets - 10 reps - Standing Hip Abduction with Counter Support  - 1 x daily - 7 x weekly - 2 sets - 10 reps - Standing Hip Extension with Counter Support  - 1 x daily - 7 x weekly - 2 sets - 10 reps    ASSESSMENT:   CLINICAL IMPRESSION: Pt was able to complete all prescribed exercises with improved tolerance and strength. She is progressing well and notes improving in functional mobility and gait at home. She does continue to need skilled PT services post op, will continue per POC as prescribed.    EVAL: Patient is a 58 y.o. F who was seen today 3 days s/p L THA. Assessment revealed limitation in ambulatory status, transfers, and LE strength and mobility. Pt able to perform STS with UE use with good lowering control. TUG score of 36 seconds with 2 wheel walker indicates significant limitations in functional mobility and potential for  fall risk. LEFS score of 13/80 also indicates high limitations in  pts confidence and perceived ability to complete functional LE tasks. Overall pts LE functional ability is good for POD 3 but is still significantly limited compared to pts PLOF. Pt will benefit from skilled PT services to address the above functional limitations.   OBJECTIVE IMPAIRMENTS: Abnormal gait, decreased activity tolerance, decreased balance, decreased coordination, decreased mobility, and decreased strength.    ACTIVITY LIMITATIONS: carrying, lifting, bending, sitting, standing, squatting, stairs, transfers, bed mobility, bathing, toileting, and dressing   PARTICIPATION LIMITATIONS: cleaning, laundry, driving, shopping, community activity, and yard work   PERSONAL FACTORS: Fitness and 1 comorbidity: HTN are also affecting patient's functional outcome.    REHAB POTENTIAL: Good   CLINICAL DECISION MAKING: Stable/uncomplicated   EVALUATION COMPLEXITY: Low     GOALS: Goals reviewed with patient? No   SHORT TERM GOALS: Target date: 04/14/2024 Pt will be compliant and knowledgeable with HEP for improved self management of functional abilities  Baseline: HEP given at eval  Goal status: MET   2.  Pt will report worst pain being 5/10 or less to improve pt comfort and functional abilities Baseline: 10/10 8/13: 10/10 Goal status: IN PROGRESS   3.  Pt will improve 30 sec sts to 7 to show progression of LE functional strength and improve pts participation in daily activities Baseline: 5 8/13: 12 Goal status: MET  4. TUG will improve to 25 sec with 2WW to show progress of functional mobility and strength  Baseline: 36 sec  8/13: 14 sec without AD  Goal status: MET       LONG TERM GOALS: Target date: 06/16/2024   Pt will self report worst pain at 3/10 to improve patient comfort and increase participation in functional activities  Baseline: 10/10 Goal status: INITIAL   2.  Pt will report normal baseline pain no more than 1/10 to shoe improved comfort and increased tolerance  to functional activities  Baseline: 4/10 Goal status: INITIAL   3.  Pt will achieve 4/5 MMT for L hip flexion, abduction and L knee flexion and extension to progress pt towards PLOF and improve participation in meaningful functional activities  Baseline: Not tested at eval  Goal status: INITIAL   4.  Pt will tolerate sitting and standing for longer than 1 hour to improve comfort and patients ability to participate in functional activities  Baseline: unable to for more than 1 hour Goal status: INITIAL   5.  Pt will improve LEFS score to 30/80 to demonstrate improved functional activity and increase participation in household ADLs and community ambulation  Baseline:  Goal status: INITIAL  6. TUG will improve to 20 sec without AD to show progress of functional mobility and strength and improve safety when ambulating  Baseline: 36 sec w 2WW  Goal status: INIITIAL       PLAN:   PT FREQUENCY: 1-2x/week   PT DURATION: 12 weeks   PLANNED INTERVENTIONS: 97110-Therapeutic exercises, 97530- Therapeutic activity, [id number]- Neuromuscular re-education, 97535- Self Care, and 02859- Manual therapy   PLAN FOR NEXT SESSION: Plan to assess response to HEP, LE strengthening, gait training, did she get a SPC?  Alm JAYSON Kingdom PT  05/04/24 10:56 AM

## 2024-05-09 ENCOUNTER — Ambulatory Visit

## 2024-05-09 DIAGNOSIS — M6281 Muscle weakness (generalized): Secondary | ICD-10-CM

## 2024-05-09 DIAGNOSIS — M25552 Pain in left hip: Secondary | ICD-10-CM | POA: Diagnosis not present

## 2024-05-09 DIAGNOSIS — R2681 Unsteadiness on feet: Secondary | ICD-10-CM

## 2024-05-09 NOTE — Therapy (Signed)
 OUTPATIENT PHYSICAL THERAPY TREATMENT     Patient Name: Savannah Mcneil MRN: [id number] DOB:[birth year], 58 y.o., female Today's Date: 03/24/2024   END OF SESSION:     PT End of Session - 05/09/24 1102     Visit Number 12    Number of Visits 25    Date for PT Re-Evaluation 06/16/24    Authorization Type [place] MEDICAID HEALTHY BLUE    Authorization Time Period 03/24/24-06/22/22    Authorization - Visit Number 12    Authorization - Number of Visits 13    PT Start Time 1102    PT Stop Time 1140    PT Time Calculation (min) 38 min    Equipment Utilized During Treatment Gait belt    Activity Tolerance Patient tolerated treatment well    Behavior During Therapy WFL for tasks assessed/performed                 REFERRING PROVIDER: Josefina Chew, MD   REFERRING DIAG: s/p left posterior total hip replacement    THERAPY DIAG:  Pain in left hip   Muscle weakness (generalized)   Unsteadiness on feet   Rationale for Evaluation and Treatment: Rehabilitation   ONSET DATE: 03/21/2024   SUBJECTIVE:    SUBJECTIVE STATEMENT: Pt presents to PT with reports of no current L hip pain, R hip bothering her a good bit. Had a lot muscle soreness after last session.   EVAL: Pt is a 58 yr old F referred to PT s/p L THA, posterior approach, POD 3. Pt comes in with 2 wheeled walker. Ambulating with step 2 gait pattern. Pt reports she is getting up and moving at least every hour. Pt sleeping in recliner, pain is worse at night, usually feels good in the morning. Pt reports incision is not bothering her, no excess drainage. Pt denies any fever, chills, sob or other signs of infection.    PERTINENT HISTORY: Arthritis Asthma HTN Lumbar fusion - 13 yrs ago PAIN:  Are you having pain? Yes: NPRS scale: 0/10, worst 10/10 Pain location: L hip and LE Pain description: aching Aggravating factors: standing for long periods Relieving factors: medication, limiting time in 1 position    PRECAUTIONS: Fall   RED FLAGS: None      WEIGHT BEARING RESTRICTIONS: L WBAT   FALLS:  Has patient fallen in last 6 months? No   LIVING ENVIRONMENT:  Lives with: lives with their family Lives in: Mobile home Stairs: Yes: External: 4 steps; can reach both Has following equipment at home: [employer] - 2 wheeled and [employer] - 4 wheeled, was using walker prior to surgery Bathroom: standard toilet height, no grab bars   OCCUPATION: On disability   PLOF: Independent with household mobility without device   PATIENT GOALS: feel better and walk thru community with AD   NEXT MD VISIT: Not Scheduled   OBJECTIVE:  Note: Objective measures were completed at Evaluation unless otherwise noted.   DIAGNOSTIC FINDINGS: see imaging   PATIENT SURVEYS:  LEFS: 13/80   COGNITION: Overall cognitive status: Within functional limits for tasks assessed                         SENSATION: NT at eval    EDEMA:  NT at eval   MUSCLE LENGTH: NT at eval   POSTURE: rounded shoulders and increased thoracic kyphosis   PALPATION: NT at eval    LOWER EXTREMITY ROM:   Active ROM Right eval Left eval  Hip  flexion      Hip extension      Hip abduction      Hip adduction      Hip internal rotation      Hip external rotation      Knee flexion      Knee extension      Ankle dorsiflexion      Ankle plantarflexion      Ankle inversion      Ankle eversion       (Blank rows = not tested)   LOWER EXTREMITY MMT:   MMT Right eval Left eval  Hip flexion 4    Hip extension      Hip abduction      Hip adduction      Hip internal rotation      Hip external rotation      Knee flexion 4    Knee extension 4    Ankle dorsiflexion      Ankle plantarflexion      Ankle inversion      Ankle eversion       (Blank rows = not tested)   LOWER EXTREMITY SPECIAL TESTS:      FUNCTIONAL TESTS:  30 seconds chair stand test: 5 with UE use TUG: 36 sec with 2 wheel walker  8/13 TUG: 14 sec w/o  AD 8/13: 30 sec STS: 12 with UE use   GAIT: Distance walked: 45ft Assistive device utilized: Environmental consultant - 2 wheeled Level of assistance: Modified independence Comments: Step to gait pattern, slow and shuffling, does not pick up feet much when turning                                                                                                                                  TREATMENT: [facility] Adult PT Treatment:                                                DATE: 05/09/24 Nu-step L5 x 4 min U/LE for functional activity tolerance STS 2x10 10lb Gait training with SPC 2x239ft Mod I Seated knee ext 3x10 15lb Seated knee flex 3x10 35lb Cybex leg press 3x10 60lb Standing hip abd 2x10 12.5lb Lateral walk RTB x 3 laps at counter Step ups 10in step B UE support L 2x10  [facility] Adult PT Treatment:                                                DATE: 05/04/24 Nu-step L5 x 4 min U/LE for functional activity tolerance STS 2x10 10lb Gait training with SPC x 217ft CGA Seated knee ext 2x10 15lb Seated knee flex 2x10 35lb Lateral walk RTB  x 3 laps at counter Standing hip abd/ext 2x10 RTB Cybex leg press 3x8 60lb Step ups 10in step 1 UE support L 2x10 Step up lateral 8in BUE support x 10  [facility] Adult PT Treatment:                                                DATE: 05/02/24 Nu-step L5 x 4 min U/LE for functional activity tolerance Standing hip abd/ext x 15 ea Gait training with SPC x 223ft CGA Seated knee ext 2x10 15lb Seated knee flex 2x10 25lb Cybex leg press 3x8 40lb Step ups 8in step 1 UE support L 2x10 STS 2x10 10lb KB  [facility] Adult PT Treatment:                                                DATE: 04/27/24 Nu-step L 5 x 6 min LE only Gait training with SPC FWD/BWD walking in // x 5 Standing hip abduction bil with GTB 2 x 12 Stepping over hurdles with SPC in // x 6 Leg press 1 x 10 40#, 1 x 10 60# Step ups on 6 inch step with SPC Standing marching in place with single hand hold on SPC 2 x  15  [facility] Adult PT Treatment:                                    DATE: 04/20/24 Therapeutic Exercise:  Nustep L4 UE/LE x 4 minutes  SL clamshell with pillow bt knees x8 - stopped d/t pain  Supine glute bridge x15 HL adduction ball squeeze x 15 HL ball squeeze with glute bridge x 10 SLR x 5 elevated - pain Knee ext machine 25# 2x10 STS low table x 8  Standing Calf raises x 20  Gait training Trial of SPC - 3 laps around // bars     PATIENT EDUCATION:  Education details: Educated on evaluation findings, expectations after surgery, posterior precautions, and monitoring for signs and symptoms of infection and when to contact her PCP. Educated on performance of HEP and POC. Educated on proper foot placement during gait and limiting  Person educated: Patient and Spouse Education method: Explanation, Demonstration, Verbal cues, and Handouts Education comprehension: verbalized understanding and returned demonstration   HOME EXERCISE PROGRAM: Access Code: [email or web address] URL: [email or web address] Date: 04/12/2024 Prepared by: Alm Kingdom Exercises - Supine March  - 1 x daily - 7 x weekly - 2 sets - 10 reps - Supine Bridge  - 1 x daily - 7 x weekly - 2-3 sets - 10 reps - Hooklying Clamshell with Resistance  - 1 x daily - 7 x weekly - 2-3 sets - 10 reps - green band hold - Sitting Knee Extension with Resistance  - 1 x daily - 7 x weekly - 3 sets - 10 reps - Sit to Stand  - 1 x daily - 7 x weekly - 2-3 sets - 10 reps - Standing Hip Abduction with Counter Support  - 1 x daily - 7 x weekly - 2 sets - 10 reps - Standing Hip Extension with Counter Support  - 1 x daily - 7 x weekly -  2 sets - 10 reps    ASSESSMENT:   CLINICAL IMPRESSION: Pt was able to complete all prescribed exercises with improved tolerance and strength. She is progressing well, gait with SPC has greatly improved with less antalgia and improved endurance. We continue to work on LE strength and functional mobility today. She does  continue to need skilled PT services post op, will continue per POC as prescribed.    EVAL: Patient is a 58 y.o. F who was seen today 3 days s/p L THA. Assessment revealed limitation in ambulatory status, transfers, and LE strength and mobility. Pt able to perform STS with UE use with good lowering control. TUG score of 36 seconds with 2 wheel walker indicates significant limitations in functional mobility and potential for fall risk. LEFS score of 13/80 also indicates high limitations in pts confidence and perceived ability to complete functional LE tasks. Overall pts LE functional ability is good for POD 3 but is still significantly limited compared to pts PLOF. Pt will benefit from skilled PT services to address the above functional limitations.   OBJECTIVE IMPAIRMENTS: Abnormal gait, decreased activity tolerance, decreased balance, decreased coordination, decreased mobility, and decreased strength.    ACTIVITY LIMITATIONS: carrying, lifting, bending, sitting, standing, squatting, stairs, transfers, bed mobility, bathing, toileting, and dressing   PARTICIPATION LIMITATIONS: cleaning, laundry, driving, shopping, community activity, and yard work   PERSONAL FACTORS: Fitness and 1 comorbidity: HTN are also affecting patient's functional outcome.    REHAB POTENTIAL: Good   CLINICAL DECISION MAKING: Stable/uncomplicated   EVALUATION COMPLEXITY: Low     GOALS: Goals reviewed with patient? No   SHORT TERM GOALS: Target date: 04/14/2024 Pt will be compliant and knowledgeable with HEP for improved self management of functional abilities  Baseline: HEP given at eval  Goal status: MET   2.  Pt will report worst pain being 5/10 or less to improve pt comfort and functional abilities Baseline: 10/10 8/13: 10/10 Goal status: IN PROGRESS   3.  Pt will improve 30 sec sts to 7 to show progression of LE functional strength and improve pts participation in daily activities Baseline: 5 8/13: 12 Goal  status: MET  4. TUG will improve to 25 sec with 2WW to show progress of functional mobility and strength  Baseline: 36 sec  8/13: 14 sec without AD  Goal status: MET       LONG TERM GOALS: Target date: 06/16/2024   Pt will self report worst pain at 3/10 to improve patient comfort and increase participation in functional activities  Baseline: 10/10 Goal status: INITIAL   2.  Pt will report normal baseline pain no more than 1/10 to shoe improved comfort and increased tolerance to functional activities  Baseline: 4/10 Goal status: INITIAL   3.  Pt will achieve 4/5 MMT for L hip flexion, abduction and L knee flexion and extension to progress pt towards PLOF and improve participation in meaningful functional activities  Baseline: Not tested at eval  Goal status: INITIAL   4.  Pt will tolerate sitting and standing for longer than 1 hour to improve comfort and patients ability to participate in functional activities  Baseline: unable to for more than 1 hour Goal status: INITIAL   5.  Pt will improve LEFS score to 30/80 to demonstrate improved functional activity and increase participation in household ADLs and community ambulation  Baseline:  Goal status: INITIAL  6. TUG will improve to 20 sec without AD to show progress of functional mobility and strength  and improve safety when ambulating  Baseline: 36 sec w 2WW  Goal status: INIITIAL       PLAN:   PT FREQUENCY: 1-2x/week   PT DURATION: 12 weeks   PLANNED INTERVENTIONS: 97110-Therapeutic exercises, 97530- Therapeutic activity, [id number]- Neuromuscular re-education, 97535- Self Care, and 02859- Manual therapy   PLAN FOR NEXT SESSION: Plan to assess response to HEP, LE strengthening, gait training, did she get a SPC?  Alm JAYSON Kingdom PT  05/09/24 11:44 AM

## 2024-05-11 ENCOUNTER — Ambulatory Visit: Admitting: Physical Therapy

## 2024-05-16 ENCOUNTER — Ambulatory Visit

## 2024-05-16 DIAGNOSIS — R2681 Unsteadiness on feet: Secondary | ICD-10-CM

## 2024-05-16 DIAGNOSIS — M25552 Pain in left hip: Secondary | ICD-10-CM | POA: Diagnosis not present

## 2024-05-16 DIAGNOSIS — M6281 Muscle weakness (generalized): Secondary | ICD-10-CM

## 2024-05-16 NOTE — Therapy (Signed)
 OUTPATIENT PHYSICAL THERAPY TREATMENT     Patient Name: Savannah Mcneil MRN: [id number] DOB:23-Dec-1965, 58 y.o., female Today's Date: 03/24/2024   END OF SESSION:     PT End of Session - 05/16/24 1107     Visit Number 13    Number of Visits 25    Date for PT Re-Evaluation 06/16/24    Authorization Type [facility] MEDICAID HEALTHY BLUE    Authorization Time Period 03/24/24-06/22/22    Authorization - Visit Number 13    Authorization - Number of Visits 13    PT Start Time 1101    PT Stop Time 1143    PT Time Calculation (min) 42 min    Equipment Utilized During Treatment Gait belt    Activity Tolerance Patient tolerated treatment well    Behavior During Therapy WFL for tasks assessed/performed          REFERRING PROVIDER: Josefina Chew, MD   REFERRING DIAG: s/p left posterior total hip replacement    THERAPY DIAG:  Pain in left hip   Muscle weakness (generalized)   Unsteadiness on feet   Rationale for Evaluation and Treatment: Rehabilitation   ONSET DATE: 03/21/2024   SUBJECTIVE:    SUBJECTIVE STATEMENT: Pt presents to PT ambulating with [facility] which she has bene using for the last few days, no issues. Is slightly sore and fatigued from standing a lot from her sons wedding last weekend.   EVAL: Pt is a 58 yr old F referred to PT s/p L THA, posterior approach, POD 3. Pt comes in with 2 wheeled walker. Ambulating with step 2 gait pattern. Pt reports she is getting up and moving at least every hour. Pt sleeping in recliner, pain is worse at night, usually feels good in the morning. Pt reports incision is not bothering her, no excess drainage. Pt denies any fever, chills, sob or other signs of infection.    PERTINENT HISTORY: Arthritis Asthma HTN Lumbar fusion - 13 yrs ago PAIN:  Are you having pain? Yes: NPRS scale: 0/10, worst 10/10 Pain location: L hip and LE Pain description: aching Aggravating factors: standing for long periods Relieving factors: medication, limiting  time in 1 position   PRECAUTIONS: Fall   RED FLAGS: None      WEIGHT BEARING RESTRICTIONS: L WBAT   FALLS:  Has patient fallen in last 6 months? No   LIVING ENVIRONMENT:  Lives with: lives with their family Lives in: Mobile home Stairs: Yes: External: 4 steps; can reach both Has following equipment at home: Vannie - 2 wheeled and [employer] - 4 wheeled, was using walker prior to surgery Bathroom: standard toilet height, no grab bars   OCCUPATION: On disability   PLOF: Independent with household mobility without device   PATIENT GOALS: feel better and walk thru community with AD   NEXT MD VISIT: Not Scheduled   OBJECTIVE:  Note: Objective measures were completed at Evaluation unless otherwise noted.   DIAGNOSTIC FINDINGS: see imaging   PATIENT SURVEYS:  LEFS: 13/80 05/16/24: 50/80   COGNITION: Overall cognitive status: Within functional limits for tasks assessed                         SENSATION: NT at eval    EDEMA:  NT at eval   MUSCLE LENGTH: NT at eval   POSTURE: rounded shoulders and increased thoracic kyphosis   PALPATION: NT at eval    LOWER EXTREMITY ROM:   Active ROM Right eval Left eval  Hip flexion      Hip extension      Hip abduction      Hip adduction      Hip internal rotation      Hip external rotation      Knee flexion      Knee extension      Ankle dorsiflexion      Ankle plantarflexion      Ankle inversion      Ankle eversion       (Blank rows = not tested)   LOWER EXTREMITY MMT:   MMT Right eval Left eval  Hip flexion 4    Hip extension      Hip abduction      Hip adduction      Hip internal rotation      Hip external rotation      Knee flexion 4    Knee extension 4    Ankle dorsiflexion      Ankle plantarflexion      Ankle inversion      Ankle eversion       (Blank rows = not tested)   LOWER EXTREMITY SPECIAL TESTS:      FUNCTIONAL TESTS:  30 seconds chair stand test: 5 with UE use TUG: 36 sec with 2 wheel  walker  8/13 TUG: 14 sec w/o AD 8/13: 30 sec STS: 12 with UE use   GAIT: Distance walked: 50ft Assistive device utilized: Environmental consultant - 2 wheeled Level of assistance: Modified independence Comments: Step to gait pattern, slow and shuffling, does not pick up feet much when turning                                                                                                                                  TREATMENT: [facility] Adult PT Treatment:                                                DATE: 05/16/24 Nu-step L5 x 4 min U/LE for functional activity tolerance STS 2x10 10lb Assessment of tests/measures, goals, and outcomes for re-cert Cybex leg press 3x10 60lb Single leg press 3x8 L 20lb Standing hip abd 2x10 25lb Step ups 10in step B UE support L 2x10  [facility] Adult PT Treatment:                                                DATE: 05/09/24 Nu-step L5 x 4 min U/LE for functional activity tolerance STS 2x10 10lb Gait training with SPC 2x261ft Mod I Seated knee ext 3x10 15lb Seated knee flex 3x10 35lb Cybex leg press 3x10 60lb Standing hip abd 2x10 12.5lb Lateral walk RTB  x 3 laps at counter Step ups 10in step B UE support L 2x10  [facility] Adult PT Treatment:                                                DATE: 05/04/24 Nu-step L5 x 4 min U/LE for functional activity tolerance STS 2x10 10lb Gait training with SPC x 261ft CGA Seated knee ext 2x10 15lb Seated knee flex 2x10 35lb Lateral walk RTB x 3 laps at counter Standing hip abd/ext 2x10 RTB Cybex leg press 3x8 60lb Step ups 10in step 1 UE support L 2x10 Step up lateral 8in BUE support x 10  [facility] Adult PT Treatment:                                                DATE: 05/02/24 Nu-step L5 x 4 min U/LE for functional activity tolerance Standing hip abd/ext x 15 ea Gait training with SPC x 270ft CGA Seated knee ext 2x10 15lb Seated knee flex 2x10 25lb Cybex leg press 3x8 40lb Step ups 8in step 1 UE support L 2x10 STS 2x10 10lb KB    PATIENT EDUCATION:  Education details: Educated on evaluation findings, expectations after surgery, posterior precautions, and monitoring for signs and symptoms of infection and when to contact her PCP. Educated on performance of HEP and POC. Educated on proper foot placement during gait and limiting  Person educated: Patient and Spouse Education method: Explanation, Demonstration, Verbal cues, and Handouts Education comprehension: verbalized understanding and returned demonstration   HOME EXERCISE PROGRAM: Access Code: [email or web address] URL: [email or web address] Date: 04/12/2024 Prepared by: Alm Kingdom Exercises - Supine March  - 1 x daily - 7 x weekly - 2 sets - 10 reps - Supine Bridge  - 1 x daily - 7 x weekly - 2-3 sets - 10 reps - Hooklying Clamshell with Resistance  - 1 x daily - 7 x weekly - 2-3 sets - 10 reps - green band hold - Sitting Knee Extension with Resistance  - 1 x daily - 7 x weekly - 3 sets - 10 reps - Sit to Stand  - 1 x daily - 7 x weekly - 2-3 sets - 10 reps - Standing Hip Abduction with Counter Support  - 1 x daily - 7 x weekly - 2 sets - 10 reps - Standing Hip Extension with Counter Support  - 1 x daily - 7 x weekly - 2 sets - 10 reps    ASSESSMENT:   CLINICAL IMPRESSION: Pt was able to complete all prescribed exercises with no adverse effect. Over the course of PT treatment she has been progressing well moving to using [facility] for ambulation and improving overall functional mobility. She mas met TUG, 30 Second Sit to Stand, and LEFS goals. Still lacking in strength and L hip continues to be painful post operatively. She would benefit from skilled PT services at decreased frequency to 1x/wk to address lingering strength impairments and decrease pain.    EVAL: Patient is a 58 y.o. F who was seen today 3 days s/p L THA. Assessment revealed limitation in ambulatory status, transfers, and LE strength and mobility. Pt able to perform STS with UE use with good lowering control.  TUG score  of 36 seconds with 2 wheel walker indicates significant limitations in functional mobility and potential for fall risk. LEFS score of 13/80 also indicates high limitations in pts confidence and perceived ability to complete functional LE tasks. Overall pts LE functional ability is good for POD 3 but is still significantly limited compared to pts PLOF. Pt will benefit from skilled PT services to address the above functional limitations.   OBJECTIVE IMPAIRMENTS: Abnormal gait, decreased activity tolerance, decreased balance, decreased coordination, decreased mobility, and decreased strength.    ACTIVITY LIMITATIONS: carrying, lifting, bending, sitting, standing, squatting, stairs, transfers, bed mobility, bathing, toileting, and dressing   PARTICIPATION LIMITATIONS: cleaning, laundry, driving, shopping, community activity, and yard work   PERSONAL FACTORS: Fitness and 1 comorbidity: HTN are also affecting patient's functional outcome.    REHAB POTENTIAL: Good   CLINICAL DECISION MAKING: Stable/uncomplicated   EVALUATION COMPLEXITY: Low     GOALS: Goals reviewed with patient? No   SHORT TERM GOALS: Target date: 04/14/2024 Pt will be compliant and knowledgeable with HEP for improved self management of functional abilities  Baseline: HEP given at eval  Goal status: MET   2.  Pt will report worst pain being 5/10 or less to improve pt comfort and functional abilities Baseline: 10/10 8/13: 10/10 Goal status: IN PROGRESS   3.  Pt will improve 30 sec sts to 7 to show progression of LE functional strength and improve pts participation in daily activities Baseline: 5 8/13: 12 Goal status: MET  4. TUG will improve to 25 sec with 2WW to show progress of functional mobility and strength  Baseline: 36 sec  8/13: 14 sec without AD  Goal status: MET       LONG TERM GOALS: Target date: 06/16/2024   Pt will self report worst pain at 3/10 to improve patient comfort and increase  participation in functional activities  Baseline: 10/10 Goal status: IN PROGRESS   2.  Pt will report normal baseline pain no more than 1/10 to shoe improved comfort and increased tolerance to functional activities  Baseline: 4/10 Goal status: INITIAL   3.  Pt will achieve 4/5 MMT for L hip flexion, abduction and L knee flexion and extension to progress pt towards PLOF and improve participation in meaningful functional activities  Baseline: Not tested at eval  Goal status: INITIAL   4.  Pt will tolerate sitting and standing for longer than 1 hour to improve comfort and patients ability to participate in functional activities  Baseline: unable to for more than 1 hour Goal status: INITIAL   5.  Pt will improve LEFS score to 30/80 to demonstrate improved functional activity and increase participation in household ADLs and community ambulation  Baseline:  05/16/24: 50/80 Goal status: MET  6. TUG will improve to 20 sec without AD to show progress of functional mobility and strength and improve safety when ambulating  Baseline: 36 sec w 2WW 8/13 TUG: 14 sec w/o AD  Goal status: MET       PLAN:   PT FREQUENCY: 1x/week   PT DURATION: 3 weeks   PLANNED INTERVENTIONS: 97110-Therapeutic exercises, 97530- Therapeutic activity, 97112- Neuromuscular re-education, 97535- Self Care, and 02859- Manual therapy   PLAN FOR NEXT SESSION: Plan to assess response to HEP, LE strengthening, gait training, did she get a SPC?  Alm JAYSON Kingdom PT  05/17/24 1:09 PM

## 2024-05-18 ENCOUNTER — Ambulatory Visit

## 2024-05-18 DIAGNOSIS — M25552 Pain in left hip: Secondary | ICD-10-CM

## 2024-05-18 DIAGNOSIS — M6281 Muscle weakness (generalized): Secondary | ICD-10-CM

## 2024-05-18 DIAGNOSIS — R2681 Unsteadiness on feet: Secondary | ICD-10-CM

## 2024-05-18 NOTE — Therapy (Signed)
 OUTPATIENT PHYSICAL THERAPY TREATMENT/DISCHARGE   PHYSICAL THERAPY DISCHARGE SUMMARY  Visits from Start of Care: 14  Current functional level related to goals / functional outcomes: See goals and objective   Remaining deficits: See goals and objective   Education / Equipment: HEP   Patient agrees to discharge. Patient goals were met. Patient is being discharged due to meeting the stated rehab goals.  Patient Name: Savannah Mcneil MRN: [id number] DOB:June 17, 1966, 58 y.o., female Today's Date: 03/24/2024   END OF SESSION:     PT End of Session - 05/18/24 1143     Visit Number 14    Number of Visits 25    Date for Recertification  06/16/24    Authorization Type [place] MEDICAID HEALTHY BLUE    Authorization Time Period 03/24/24-06/22/22    Authorization - Number of Visits 13    PT Start Time 1145    PT Stop Time 1215    PT Time Calculation (min) 30 min    Equipment Utilized During Treatment Gait belt    Activity Tolerance Patient tolerated treatment well    Behavior During Therapy WFL for tasks assessed/performed           REFERRING PROVIDER: Josefina Chew, MD   REFERRING DIAG: s/p left posterior total hip replacement    THERAPY DIAG:  Pain in left hip   Muscle weakness (generalized)   Unsteadiness on feet   Rationale for Evaluation and Treatment: Rehabilitation   ONSET DATE: 03/21/2024   SUBJECTIVE:    SUBJECTIVE STATEMENT: Pt presents to PT with reports of increased soreness. Overall feels like she is doing well, feels ready for discharge from her L hip at this time.   EVAL: Pt is a 58 yr old F referred to PT s/p L THA, posterior approach, POD 3. Pt comes in with 2 wheeled walker. Ambulating with step 2 gait pattern. Pt reports she is getting up and moving at least every hour. Pt sleeping in recliner, pain is worse at night, usually feels good in the morning. Pt reports incision is not bothering her, no excess drainage. Pt denies any fever, chills, sob or other  signs of infection.    PERTINENT HISTORY: Arthritis Asthma HTN Lumbar fusion - 13 yrs ago PAIN:  Are you having pain? Yes: NPRS scale: 0/10, worst 10/10 Pain location: L hip and LE Pain description: aching Aggravating factors: standing for long periods Relieving factors: medication, limiting time in 1 position   PRECAUTIONS: Fall   RED FLAGS: None      WEIGHT BEARING RESTRICTIONS: L WBAT   FALLS:  Has patient fallen in last 6 months? No   LIVING ENVIRONMENT:  Lives with: lives with their family Lives in: Mobile home Stairs: Yes: External: 4 steps; can reach both Has following equipment at home: Vannie - 2 wheeled and [employer] - 4 wheeled, was using walker prior to surgery Bathroom: standard toilet height, no grab bars   OCCUPATION: On disability   PLOF: Independent with household mobility without device   PATIENT GOALS: feel better and walk thru community with AD   NEXT MD VISIT: Not Scheduled   OBJECTIVE:  Note: Objective measures were completed at Evaluation unless otherwise noted.   DIAGNOSTIC FINDINGS: see imaging   PATIENT SURVEYS:  LEFS: 13/80 05/16/24: 50/80   COGNITION: Overall cognitive status: Within functional limits for tasks assessed                         SENSATION: NT at  eval    EDEMA:  NT at eval   MUSCLE LENGTH: NT at eval   POSTURE: rounded shoulders and increased thoracic kyphosis   PALPATION: NT at eval    LOWER EXTREMITY ROM:   Active ROM Right eval Left eval  Hip flexion      Hip extension      Hip abduction      Hip adduction      Hip internal rotation      Hip external rotation      Knee flexion      Knee extension      Ankle dorsiflexion      Ankle plantarflexion      Ankle inversion      Ankle eversion       (Blank rows = not tested)   LOWER EXTREMITY MMT:   MMT Right eval Left  9/18  Hip flexion 4  4+  Hip extension      Hip abduction   4+   Hip adduction      Hip internal rotation      Hip  external rotation      Knee flexion 4  5  Knee extension 4  5  Ankle dorsiflexion      Ankle plantarflexion      Ankle inversion      Ankle eversion       (Blank rows = not tested)   LOWER EXTREMITY SPECIAL TESTS:      FUNCTIONAL TESTS:  30 seconds chair stand test: 5 with UE use TUG: 36 sec with 2 wheel walker  8/13 TUG: 14 sec w/o AD 8/13: 30 sec STS: 12 with UE use   GAIT: Distance walked: 17ft Assistive device utilized: Environmental consultant - 2 wheeled Level of assistance: Modified independence Comments: Step to gait pattern, slow and shuffling, does not pick up feet much when turning                                                                                                                                  TREATMENT: [facility] Adult PT Treatment:                                                DATE: 05/18/24 Nu-step L6 x 4 min U/LE for functional activity tolerance Assessment of tests/measures, goals, and outcomes for discharge Hooklying clamshell 2x15 black band Banded bridge 2x10 black band Supine SLR x 10 L STS 2x10 10lb Lateral walk RTB x 4 laps at counter Standing hip abd/ext 2x10 RTB    PATIENT EDUCATION:  Education details: Educated on evaluation findings, expectations after surgery, posterior precautions, and monitoring for signs and symptoms of infection and when to contact her PCP. Educated on performance of HEP and POC. Educated on proper foot placement during gait and limiting  Person educated: Patient and Spouse Education method: Explanation, Demonstration, Verbal cues, and Handouts Education comprehension: verbalized understanding and returned demonstration   HOME EXERCISE PROGRAM: Access Code: [email or web address] URL: [email or web address] Date: 05/18/2024 Prepared by: Alm Kingdom  Exercises - Hooklying Clamshell with Resistance  - 3-4 x weekly - 2-3 sets - 10 reps - black band hold - Supine Bridge with Resistance Band  - 3-4 x weekly - 3 sets - 10 reps -  black band hold - Active Straight Leg Raise with Quad Set  - 3-4 x weekly - 2 sets - 10 reps - Sit to Stand  - 3-4 x weekly - 2-3 sets - 10 reps - Standing Hip Abduction with Resistance at Ankles and Counter Support  - 1 x daily - 7 x weekly - 2 sets - 10 reps - red band hold - Standing Hip Extension with Resistance at Ankles and Counter Support  - 1 x daily - 7 x weekly - 2 sets - 10 reps - red band hold   ASSESSMENT:   CLINICAL IMPRESSION: Pt was able to complete all prescribed exercises with no adverse effect. Over the course of PT treatment she has progressed well, meeting all LTGs and improving strength for her L LE post op. At this point she is likely more limited by her R hip than her L hip post THA. She wants to discuss getting her R hip replaced when she sees her MD on 05/31/24. We will discharge from therapy at this time as she is doing well and wants to conserve visits for later this year.    EVAL: Patient is a 58 y.o. F who was seen today 3 days s/p L THA. Assessment revealed limitation in ambulatory status, transfers, and LE strength and mobility. Pt able to perform STS with UE use with good lowering control. TUG score of 36 seconds with 2 wheel walker indicates significant limitations in functional mobility and potential for fall risk. LEFS score of 13/80 also indicates high limitations in pts confidence and perceived ability to complete functional LE tasks. Overall pts LE functional ability is good for POD 3 but is still significantly limited compared to pts PLOF. Pt will benefit from skilled PT services to address the above functional limitations.   OBJECTIVE IMPAIRMENTS: Abnormal gait, decreased activity tolerance, decreased balance, decreased coordination, decreased mobility, and decreased strength.    ACTIVITY LIMITATIONS: carrying, lifting, bending, sitting, standing, squatting, stairs, transfers, bed mobility, bathing, toileting, and dressing   PARTICIPATION LIMITATIONS:  cleaning, laundry, driving, shopping, community activity, and yard work   PERSONAL FACTORS: Fitness and 1 comorbidity: HTN are also affecting patient's functional outcome.    REHAB POTENTIAL: Good   CLINICAL DECISION MAKING: Stable/uncomplicated   EVALUATION COMPLEXITY: Low     GOALS: Goals reviewed with patient? No   SHORT TERM GOALS: Target date: 04/14/2024 Pt will be compliant and knowledgeable with HEP for improved self management of functional abilities  Baseline: HEP given at eval  Goal status: MET   2.  Pt will report worst pain being 5/10 or less to improve pt comfort and functional abilities Baseline: 10/10 8/13: 10/10 Goal status: IN PROGRESS   3.  Pt will improve 30 sec sts to 7 to show progression of LE functional strength and improve pts participation in daily activities Baseline: 5 8/13: 12 Goal status: MET  4. TUG will improve to 25 sec with 2WW to show progress of functional mobility and strength  Baseline: 36 sec  8/13: 14 sec without AD  Goal status: MET       LONG TERM GOALS: Target date: 06/16/2024   Pt will self report worst pain at 3/10 to improve patient comfort and increase participation in functional activities  Baseline: 10/10 Goal status: MOSTLY MET   2.  Pt will report normal baseline pain no more than 1/10 to shoe improved comfort and increased tolerance to functional activities  Baseline: 4/10 Goal status: MOSTLY MET   3.  Pt will achieve 4/5 MMT for L hip flexion, abduction and L knee flexion and extension to progress pt towards PLOF and improve participation in meaningful functional activities  Baseline: Not tested at eval  Goal status: MET   4.  Pt will tolerate sitting and standing for longer than 1 hour to improve comfort and patients ability to participate in functional activities  Baseline: unable to for more than 1 hour Goal status: MET   5.  Pt will improve LEFS score to 30/80 to demonstrate improved functional activity and  increase participation in household ADLs and community ambulation  Baseline:  05/16/24: 50/80 Goal status: MET  6. TUG will improve to 20 sec without AD to show progress of functional mobility and strength and improve safety when ambulating  Baseline: 36 sec w 2WW 8/13 TUG: 14 sec w/o AD  Goal status: MET       PLAN:   PT FREQUENCY: 1x/week   PT DURATION: 3 weeks   PLANNED INTERVENTIONS: 97110-Therapeutic exercises, 97530- Therapeutic activity, 97112- Neuromuscular re-education, 97535- Self Care, and 02859- Manual therapy   PLAN FOR NEXT SESSION: Plan to assess response to HEP, LE strengthening, gait training, did she get a SPC?  Alm JAYSON Kingdom PT  05/18/24 4:27 PM

## 2024-06-27 NOTE — Progress Notes (Signed)
 Sent message, via epic in basket, requesting orders in epic from [facility] adviser.

## 2024-07-03 NOTE — H&P (Signed)
 HIP ARTHROPLASTY ADMISSION H&P  Patient ID: Savannah Mcneil MRN: [id number] DOB/AGE: 1966/04/29 58 y.o.  Chief Complaint: right hip pain.  Planned Procedure Date: 07/11/24 Medical Clearance by Dr. Francois  HPI: Savannah Mcneil is a 58 y.o. female who presents for evaluation of osteoarthritis of right hip. The patient has a history of pain and functional disability in the right hip due to arthritis and has failed non-surgical conservative treatments for greater than 12 weeks to include NSAID's and/or analgesics, use of assistive devices, weight reduction as appropriate, and activity modification.  Onset of symptoms was gradual with rapidlly worsening course since that time. The patient noted no past surgery on the right hip.  Patient currently rates pain at 9 out of 10 with activity. Patient has worsening of pain with activity and weight bearing and pain that interferes with activities of daily living.  Patient has evidence of joint space narrowing by imaging studies.  There is no active infection.  Past Medical History:  Diagnosis Date   Arthritis    Asthma    Diverticulitis    GERD (gastroesophageal reflux disease)    Headache    Hx of mirgranes   Hypertension    Neuromuscular disorder (HCC)    carpal tunnel   Pneumonia    Past Surgical History:  Procedure Laterality Date   ABLATION     uterus   CARPAL TUNNEL RELEASE Right    COLECTOMY     COLONOSCOPY     DILATION AND CURETTAGE OF UTERUS     ESOPHAGOGASTRODUODENOSCOPY     ESOPHAGUS SURGERY     TOTAL HIP ARTHROPLASTY Left 03/21/2024   Procedure: ARTHROPLASTY, HIP, TOTAL,POSTERIOR APPROACH;  Surgeon: Josefina Chew, MD;  Location: [facility];  Service: Orthopedics;  Laterality: Left;   Allergies  Allergen Reactions   Naproxen  Nausea And Vomiting   Aspirin Other (See Comments)    Upsets her stomach   Prior to Admission medications   Medication Sig Start Date End Date Taking? Authorizing Provider  albuterol  (PROVENTIL ) (2.5  MG/3ML) 0.083% nebulizer solution Inhale 2.5 mg into the lungs every 6 (six) hours as needed for wheezing or shortness of breath. 09/15/22  Yes [provider]  albuterol  (VENTOLIN  HFA) 108 (90 Base) MCG/ACT inhaler Inhale 2 puffs into the lungs every 6 (six) hours as needed for wheezing or shortness of breath.   Yes [provider]  Cholecalciferol (VITAMIN D3) 125 MCG (5000 UT) CAPS Take 5,000 Units by mouth daily.   Yes [provider]  FIBER ADULT GUMMIES PO Take 1 each by mouth daily.   Yes [provider]  gabapentin  (NEURONTIN ) 800 MG tablet Take 800 mg by mouth 3 (three) times daily.   Yes [provider]  hydrochlorothiazide (HYDRODIURIL) 25 MG tablet Take 25 mg by mouth daily. 12/06/23  Yes [provider]  ibuprofen  (ADVIL ) 200 MG tablet Take 200-800 mg by mouth every 8 (eight) hours as needed for moderate pain (pain score 4-6).   Yes [provider]  losartan (COZAAR) 100 MG tablet Take 100 mg by mouth daily.   Yes [provider]  omeprazole (PRILOSEC) 40 MG capsule Take 40 mg by mouth daily. 05/27/21  Yes [provider]  ondansetron  (ZOFRAN ) 4 MG tablet Take 1 tablet (4 mg total) by mouth every 8 (eight) hours as needed for nausea or vomiting. Patient not taking: Reported on 06/30/2024 03/21/24   Renae Bernarda HERO, PA-C  oxyCODONE  (ROXICODONE ) 5 MG immediate release tablet Take 1 tablet (5 mg total)  by mouth every 4 (four) hours as needed for severe pain (pain score 7-10). Patient not taking: Reported on 06/30/2024 03/21/24   Renae Bernarda HERO, PA-C  rivaroxaban  (XARELTO ) 10 MG TABS tablet Take 1 tablet (10 mg total) by mouth daily. Patient not taking: Reported on 06/30/2024 03/21/24   Renae Bernarda HERO, PA-C  sennosides-docusate sodium  (SENOKOT-S) 8.6-50 MG tablet Take 2 tablets by mouth daily. Patient not taking: Reported on 06/30/2024 03/21/24   Renae Bernarda HERO, PA-C   Social History    Socioeconomic History   Marital status: Single    Spouse name: Not on file   Number of children: Not on file   Years of education: Not on file   Highest education level: Not on file  Occupational History   Not on file  Tobacco Use   Smoking status: Every Day    Current packs/day: 0.25    Types: Cigarettes   Smokeless tobacco: Never   Tobacco comments:    40 year smoker  Vaping Use   Vaping status: Former  Substance and Sexual Activity   Alcohol use: Yes    Alcohol/week: 1.0 standard drink of alcohol    Types: 1 Standard drinks or equivalent per week    Comment: social   Drug use: Never   Sexual activity: Not Currently  Other Topics Concern   Not on file  Social History Narrative   Not on file   Social Drivers of Health   Financial Resource Strain: Not on File (12/18/2021)   Received from [employer]    Financial Resource Strain: 0  Food Insecurity: Not at Risk (12/06/2023)   Received from [facility] Insecurity    Within the past 12 months, you worried that your food would run out before you got money to buy more.: 1  Transportation Needs: Not at Risk (12/06/2023)   Received from [facility] Needs    In the past 12 months, has lack of transportation kept you from medical appointments, meetings, work or from getting things needed for daily living?: 1  Physical Activity: Not on File (12/18/2021)   Received from [facility]   Physical Activity    Physical Activity: 0  Stress: Not on File (12/18/2021)   Received from [facility]   Stress    Stress: 0  Social Connections: Not on File (05/17/2023)   Received from [facility]   Social Connections    Connectedness: 0   Family History  Problem Relation Age of Onset   Colon cancer Neg Hx    Rectal cancer Neg Hx    Stomach cancer Neg Hx     ROS: Currently denies lightheadedness, dizziness, Fever, chills, CP, SOB.   No personal history of DVT, PE, MI, or CVA. No loose teeth or dentures All other  systems have been reviewed and were otherwise currently negative with the exception of those mentioned in the HPI and as above.  Objective: Physical Exam: General: Alert, NAD.  HEENT: EOMI, Good Neck Extension  Pulm: No increased work of breathing.   CV: RRR,  GI: soft, NT, ND Neuro: Neuro without gross focal deficit.  Sensation intact distally Skin: No lesions in the area of chief complaint MSK/Surgical Site: right Hip pain with passive ROM.  Positive Stinchfield.  5/5 strength.  NVI.    Imaging Review Plain radiographs demonstrate severe degenerative joint disease of the right hip.   Preoperative templating of the joint replacement has been completed, documented, and submitted to the  Operating Room personnel in order to optimize intra-operative equipment management.  Assessment: osteoarthritis of right hip   Plan: Plan for Procedure(s): ARTHROPLASTY, HIP, TOTAL,POSTERIOR APPROACH  The patient history, physical exam, clinical judgement of the provider and imaging are consistent with end stage degenerative joint disease and total joint arthroplasty is deemed medically necessary. The treatment options including medical management, injection therapy, and arthroplasty were discussed at length. The risks and benefits of Procedure(s): ARTHROPLASTY, HIP, TOTAL,POSTERIOR APPROACH were presented and reviewed.  The risks of nonoperative treatment, versus surgical intervention including but not limited to continued pain, aseptic loosening, stiffness, dislocation/subluxation, infection, bleeding, nerve injury, blood clots, cardiopulmonary complications, morbidity, mortality, among others were discussed. The patient verbalizes understanding and wishes to proceed with the plan.  Patient is being admitted for surgery, pain control, PT, prophylactic antibiotics, VTE prophylaxis, progressive ambulation, ADL's and discharge planning.    The patient does meet the criteria for TXA which will be used  perioperatively.   Xarelto  will be used postoperatively for DVT prophylaxis in addition to SCDs, and early ambulation. The patient is planning to be discharged home with OPPT in care of family   Shaheer Bonfield K Matheson Vandehei, PA-C 07/03/2024 4:25 PM

## 2024-07-03 NOTE — Progress Notes (Signed)
 COVID Vaccine Completed:  Date of COVID positive in last 90 days:  PCP - [facility]  Cardiologist - n/a  Chest x-ray - N/A EKG - 03/16/24 Epic Stress Test - many years ago per pt ECHO - N/A Cardiac Cath - n/a Pacemaker/ICD device last checked:N/A Spinal Cord Stimulator:N/A  Bowel Prep - N/A  Sleep Study - N/A CPAP -   Fasting Blood Sugar - N/A Checks Blood Sugar _____ times a day  Last dose of GLP1 agonist-  N/A GLP1 instructions:  Do not take after     Last dose of SGLT-2 inhibitors-  N/A SGLT-2 instructions:  Do not take after     Blood Thinner Instructions: N/A Last dose:   Time: Aspirin Instructions:N/A Last Dose:  Activity level: Can go up a flight of stairs and perform activities of daily living without stopping and without symptoms of chest pain or shortness of breath. Using Rolator for distances. Has Cane PRN    Anesthesia review: N/A  Patient denies shortness of breath, fever, cough and chest pain at PAT appointment  Patient verbalized understanding of instructions that were given to them at the PAT appointment. Patient was also instructed that they will need to review over the PAT instructions again at home before surgery.

## 2024-07-03 NOTE — Patient Instructions (Signed)
 SURGICAL WAITING ROOM VISITATION  Patients having surgery or a procedure may have no more than 2 support people in the waiting area - these visitors may rotate.    Children under the age of 20 must have an adult with them who is not the patient.  Visitors with respiratory illnesses are discouraged from visiting and should remain at home.  If the patient needs to stay at the hospital during part of their recovery, the visitor guidelines for inpatient rooms apply. Pre-op nurse will coordinate an appropriate time for 1 support person to accompany patient in pre-op.  This support person may not rotate.    Please refer to the [facility] website for the visitor guidelines for Inpatients (after your surgery is over and you are in a regular room).    Your procedure is scheduled on: 07/11/24   Report to [facility] Main Entrance    Report to admitting at 8:30 AM   Call this number if you have problems the morning of surgery [phone]   Do not eat food :After Midnight.   After Midnight you may have the following liquids until 8:00 AM DAY OF SURGERY  Water  Non-Citrus Juices (without pulp, NO RED-Apple, White grape, White cranberry) Black Coffee (NO MILK/CREAM OR CREAMERS, sugar ok)  Clear Tea (NO MILK/CREAM OR CREAMERS, sugar ok) regular and decaf                             Plain Jell-O (NO RED)                                           Fruit ices (not with fruit pulp, NO RED)                                     Popsicles (NO RED)                                                               Sports drinks like Gatorade (NO RED)    The day of surgery:  Drink ONE (1) Pre-Surgery Clear Ensure at 8:00 AM the morning of surgery. Drink in one sitting. Do not sip.  This drink was given to you during your hospital  pre-op appointment visit. Nothing else to drink after completing the  Pre-Surgery Clear Ensure.          If you have questions, please contact your surgeon's  office.   FOLLOW BOWEL PREP AND ANY ADDITIONAL PRE OP INSTRUCTIONS YOU RECEIVED FROM YOUR SURGEON'S OFFICE!!!     Oral Hygiene is also important to reduce your risk of infection.                                    Remember - BRUSH YOUR TEETH THE MORNING OF SURGERY WITH YOUR REGULAR TOOTHPASTE  DENTURES WILL BE REMOVED PRIOR TO SURGERY PLEASE DO NOT APPLY Poly grip OR ADHESIVES!!!   Do NOT smoke after Midnight   Stop all vitamins  and herbal supplements 7 days before surgery.   Take these medicines the morning of surgery with A SIP OF WATER : Inhalers, Gabapentin , Omeprazole   DO NOT TAKE ANY ORAL DIABETIC MEDICATIONS DAY OF YOUR SURGERY  Bring CPAP mask and tubing day of surgery.                              You may not have any metal on your body including hair pins, jewelry, and body piercing             Do not wear make-up, lotions, powders, perfumes, or deodorant  Do not wear nail polish including gel and S&S, artificial/acrylic nails, or any other type of covering on natural nails including finger and toenails. If you have artificial nails, gel coating, etc. that needs to be removed by a nail salon please have this removed prior to surgery or surgery may need to be canceled/ delayed if the surgeon/ anesthesia feels like they are unable to be safely monitored.   Do not shave  48 hours prior to surgery.    Do not bring valuables to the hospital. [facility] IS NOT             RESPONSIBLE   FOR VALUABLES.   Contacts, glasses, dentures or bridgework may not be worn into surgery.  DO NOT BRING YOUR HOME MEDICATIONS TO THE HOSPITAL. PHARMACY WILL DISPENSE MEDICATIONS LISTED ON YOUR MEDICATION LIST TO YOU DURING YOUR ADMISSION IN THE HOSPITAL!    Patients discharged on the day of surgery will not be allowed to drive home.  Someone NEEDS to stay with you for the first 24 hours after anesthesia.   Special Instructions: Bring a copy of your healthcare power of attorney and living  will documents the day of surgery if you haven't scanned them before.              Please read over the following fact sheets you were given: IF YOU HAVE QUESTIONS ABOUT YOUR PRE-OP INSTRUCTIONS PLEASE CALL [phone] [place].   If you received a COVID test during your pre-op visit  it is requested that you wear a mask when out in public, stay away from anyone that may not be feeling well and notify your surgeon if you develop symptoms. If you test positive for Covid or have been in contact with anyone that has tested positive in the last 10 days please notify you surgeon.      Pre-operative 4 CHG Bath Instructions  DYNA-Hex 4 Chlorhexidine  Gluconate 4% Solution Antiseptic 4 fl. oz   You can play a key role in reducing the risk of infection after surgery. Your skin needs to be as free of germs as possible. You can reduce the number of germs on your skin by washing with CHG (chlorhexidine  gluconate) soap before surgery. CHG is an antiseptic soap that kills germs and continues to kill germs even after washing.   DO NOT use if you have an allergy to chlorhexidine /CHG or antibacterial soaps. If your skin becomes reddened or irritated, stop using the CHG and notify one of our RNs at   Please shower with the CHG soap starting 4 days before surgery using the following schedule:     Please keep in mind the following:  DO NOT shave, including legs and underarms, starting the day of your first shower.   You may shave your face at any point before/day of surgery.  Place  clean sheets on your bed the day you start using CHG soap. Use a clean washcloth (not used since being washed) for each shower. DO NOT sleep with pets once you start using the CHG.  CHG Shower Instructions:  If you choose to wash your hair and private area, wash first with your normal shampoo/soap.  After you use shampoo/soap, rinse your hair and body thoroughly to remove shampoo/soap residue.  Turn the water  OFF and apply about  3 tablespoons (45 ml) of CHG soap to a CLEAN washcloth.  Apply CHG soap ONLY FROM YOUR NECK DOWN TO YOUR TOES (washing for 3-5 minutes)  DO NOT use CHG soap on face, private areas, open wounds, or sores.  Pay special attention to the area where your surgery is being performed.  If you are having back surgery, having someone wash your back for you may be helpful. Wait 2 minutes after CHG soap is applied, then you may rinse off the CHG soap.  Pat dry with a clean towel  Put on clean clothes/pajamas   If you choose to wear lotion, please use ONLY the CHG-compatible lotions on the back of this paper.     Additional instructions for the day of surgery: DO NOT APPLY any lotions, deodorants, cologne, or perfumes.   Put on clean/comfortable clothes.  Brush your teeth.  Ask your nurse before applying any prescription medications to the skin.   CHG Compatible Lotions   Aveeno Moisturizing lotion  Cetaphil Moisturizing Cream  Cetaphil Moisturizing Lotion  Clairol Herbal Essence Moisturizing Lotion, Dry Skin  Clairol Herbal Essence Moisturizing Lotion, Extra Dry Skin  Clairol Herbal Essence Moisturizing Lotion, Normal Skin  Curel Age Defying Therapeutic Moisturizing Lotion with Alpha Hydroxy  Curel Extreme Care Body Lotion  Curel Soothing Hands Moisturizing Hand Lotion  Curel Therapeutic Moisturizing Cream, Fragrance-Free  Curel Therapeutic Moisturizing Lotion, Fragrance-Free  Curel Therapeutic Moisturizing Lotion, Original Formula  Eucerin Daily Replenishing Lotion  Eucerin Dry Skin Therapy Plus Alpha Hydroxy Crme  Eucerin Dry Skin Therapy Plus Alpha Hydroxy Lotion  Eucerin Original Crme  Eucerin Original Lotion  Eucerin Plus Crme Eucerin Plus Lotion  Eucerin TriLipid Replenishing Lotion  Keri Anti-Bacterial Hand Lotion  Keri Deep Conditioning Original Lotion Dry Skin Formula Softly Scented  Keri Deep Conditioning Original Lotion, Fragrance Free Sensitive Skin Formula  Keri Lotion  Fast Absorbing Fragrance Free Sensitive Skin Formula  Keri Lotion Fast Absorbing Softly Scented Dry Skin Formula  Keri Original Lotion  Keri Skin Renewal Lotion Keri Silky Smooth Lotion  Keri Silky Smooth Sensitive Skin Lotion  Nivea Body Creamy Conditioning Oil  Nivea Body Extra Enriched Lotion  Nivea Body Original Lotion  Nivea Body Sheer Moisturizing Lotion Nivea Crme  Nivea Skin Firming Lotion  NutraDerm 30 Skin Lotion  NutraDerm Skin Lotion  NutraDerm Therapeutic Skin Cream  NutraDerm Therapeutic Skin Lotion  ProShield Protective Hand Cream  Provon moisturizing lotion  View Pre-Surgery Education Videos:  [email or web address]     Incentive Spirometer  An incentive spirometer is a tool that can help keep your lungs clear and active. This tool measures how well you are filling your lungs with each breath. Taking long deep breaths may help reverse or decrease the chance of developing breathing (pulmonary) problems (especially infection) following: A long period of time when you are unable to move or be active. BEFORE THE PROCEDURE  If the spirometer includes an indicator to show your best effort, your nurse or respiratory therapist will set it to a desired goal. If possible, sit  up straight or lean slightly forward. Try not to slouch. Hold the incentive spirometer in an upright position. INSTRUCTIONS FOR USE  Sit on the edge of your bed if possible, or sit up as far as you can in bed or on a chair. Hold the incentive spirometer in an upright position. Breathe out normally. Place the mouthpiece in your mouth and seal your lips tightly around it. Breathe in slowly and as deeply as possible, raising the piston or the ball toward the top of the column. Hold your breath for 3-5 seconds or for as long as possible. Allow the piston or ball to fall to the bottom of the column. Remove the mouthpiece from your mouth and breathe  out normally. Rest for a few seconds and repeat Steps 1 through 7 at least 10 times every 1-2 hours when you are awake. Take your time and take a few normal breaths between deep breaths. The spirometer may include an indicator to show your best effort. Use the indicator as a goal to work toward during each repetition. After each set of 10 deep breaths, practice coughing to be sure your lungs are clear. If you have an incision (the cut made at the time of surgery), support your incision when coughing by placing a pillow or rolled up towels firmly against it. Once you are able to get out of bed, walk around indoors and cough well. You may stop using the incentive spirometer when instructed by your caregiver.  RISKS AND COMPLICATIONS Take your time so you do not get dizzy or light-headed. If you are in pain, you may need to take or ask for pain medication before doing incentive spirometry. It is harder to take a deep breath if you are having pain. AFTER USE Rest and breathe slowly and easily. It can be helpful to keep track of a log of your progress. Your caregiver can provide you with a simple table to help with this. If you are using the spirometer at home, follow these instructions: SEEK MEDICAL CARE IF:  You are having difficultly using the spirometer. You have trouble using the spirometer as often as instructed. Your pain medication is not giving enough relief while using the spirometer. You develop fever of 100.5 F (38.1 C) or higher. SEEK IMMEDIATE MEDICAL CARE IF:  You cough up bloody sputum that had not been present before. You develop fever of 102 F (38.9 C) or greater. You develop worsening pain at or near the incision site. MAKE SURE YOU:  Understand these instructions. Will watch your condition. Will get help right away if you are not doing well or get worse. Document Released: 12/28/2006 Document Revised: 11/09/2011 Document Reviewed: 02/28/2007 ExitCare Patient Information  2014 ExitCare, [place].   ________________________________________________________________________ WHAT IS A BLOOD TRANSFUSION? Blood Transfusion Information  A transfusion is the replacement of blood or some of its parts. Blood is made up of multiple cells which provide different functions. Red blood cells carry oxygen and are used for blood loss replacement. White blood cells fight against infection. Platelets control bleeding. Plasma helps clot blood. Other blood products are available for specialized needs, such as hemophilia or other clotting disorders. BEFORE THE TRANSFUSION  Who gives blood for transfusions?  Healthy volunteers who are fully evaluated to make sure their blood is safe. This is blood bank blood. Transfusion therapy is the safest it has ever been in the practice of medicine. Before blood is taken from a donor, a complete history is taken to make sure  that person has no history of diseases nor engages in risky social behavior (examples are intravenous drug use or sexual activity with multiple partners). The donor's travel history is screened to minimize risk of transmitting infections, such as malaria. The donated blood is tested for signs of infectious diseases, such as HIV and hepatitis. The blood is then tested to be sure it is compatible with you in order to minimize the chance of a transfusion reaction. If you or a relative donates blood, this is often done in anticipation of surgery and is not appropriate for emergency situations. It takes many days to process the donated blood. RISKS AND COMPLICATIONS Although transfusion therapy is very safe and saves many lives, the main dangers of transfusion include:  Getting an infectious disease. Developing a transfusion reaction. This is an allergic reaction to something in the blood you were given. Every precaution is taken to prevent this. The decision to have a blood transfusion has been considered carefully by your caregiver  before blood is given. Blood is not given unless the benefits outweigh the risks. AFTER THE TRANSFUSION Right after receiving a blood transfusion, you will usually feel much better and more energetic. This is especially true if your red blood cells have gotten low (anemic). The transfusion raises the level of the red blood cells which carry oxygen, and this usually causes an energy increase. The nurse administering the transfusion will monitor you carefully for complications. HOME CARE INSTRUCTIONS  No special instructions are needed after a transfusion. You may find your energy is better. Speak with your caregiver about any limitations on activity for underlying diseases you may have. SEEK MEDICAL CARE IF:  Your condition is not improving after your transfusion. You develop redness or irritation at the intravenous (IV) site. SEEK IMMEDIATE MEDICAL CARE IF:  Any of the following symptoms occur over the next 12 hours: Shaking chills. You have a temperature by mouth above 102 F (38.9 C), not controlled by medicine. Chest, back, or muscle pain. People around you feel you are not acting correctly or are confused. Shortness of breath or difficulty breathing. Dizziness and fainting. You get a rash or develop hives. You have a decrease in urine output. Your urine turns a dark color or changes to pink, red, or brown. Any of the following symptoms occur over the next 10 days: You have a temperature by mouth above 102 F (38.9 C), not controlled by medicine. Shortness of breath. Weakness after normal activity. The white part of the eye turns yellow (jaundice). You have a decrease in the amount of urine or are urinating less often. Your urine turns a dark color or changes to pink, red, or brown. Document Released: 08/14/2000 Document Revised: 11/09/2011 Document Reviewed: 04/02/2008 [facility] Patient Information 2014 [place],  [place].  _______________________________________________________________________

## 2024-07-04 ENCOUNTER — Encounter (HOSPITAL_COMMUNITY)
Admission: RE | Admit: 2024-07-04 | Discharge: 2024-07-04 | Disposition: A | Discharge status: Home / Self Care | Source: Ambulatory Visit | Attending: Orthopedic Surgery | Admitting: Orthopedic Surgery

## 2024-07-04 ENCOUNTER — Encounter (HOSPITAL_COMMUNITY): Payer: Self-pay

## 2024-07-04 ENCOUNTER — Other Ambulatory Visit: Payer: Self-pay

## 2024-07-04 VITALS — BP 130/85 | HR 76 | Temp 98.4°F | Resp 18 | Ht 65.25 in | Wt 199.0 lb

## 2024-07-04 DIAGNOSIS — Z01812 Encounter for preprocedural laboratory examination: Secondary | ICD-10-CM | POA: Diagnosis not present

## 2024-07-04 DIAGNOSIS — I10 Essential (primary) hypertension: Secondary | ICD-10-CM | POA: Insufficient documentation

## 2024-07-04 DIAGNOSIS — Z01818 Encounter for other preprocedural examination: Secondary | ICD-10-CM | POA: Diagnosis present

## 2024-07-04 LAB — BASIC METABOLIC PANEL WITH GFR
Anion gap: 9 (ref 5–15)
BUN: 14 mg/dL (ref 6–20)
CO2: 29 mmol/L (ref 22–32)
Calcium: 10.1 mg/dL (ref 8.9–10.3)
Chloride: 100 mmol/L (ref 98–111)
Creatinine, Ser: 0.85 mg/dL (ref 0.44–1.00)
GFR, Estimated: >60 mL/min (ref >60)
Glucose, Bld: 91 mg/dL (ref 70–99)
Potassium: 4.6 mmol/L (ref 3.5–5.1)
Sodium: 138 mmol/L (ref 135–145)

## 2024-07-04 LAB — CBC
HCT: 45.6 % (ref 36.0–46.0)
Hemoglobin: 14.6 g/dL (ref 12.0–15.0)
MCH: 30.1 pg (ref 26.0–34.0)
MCHC: 32 g/dL (ref 30.0–36.0)
MCV: 94 fL (ref 80.0–100.0)
Platelets: 330 K/uL (ref 150–400)
RBC: 4.85 MIL/uL (ref 3.87–5.11)
RDW: 12.9 % (ref 11.5–15.5)
WBC: 8.2 K/uL (ref 4.0–10.5)
nRBC: 0 % (ref 0.0–0.2)

## 2024-07-04 LAB — SURGICAL PCR SCREEN
MRSA, PCR: NEGATIVE
Staphylococcus aureus: POSITIVE — AB

## 2024-07-04 NOTE — Progress Notes (Signed)
ST[facility]+ results routed to Dr. Landau  

## 2024-07-11 ENCOUNTER — Ambulatory Visit (HOSPITAL_BASED_OUTPATIENT_CLINIC_OR_DEPARTMENT_OTHER): Payer: Self-pay | Admitting: Anesthesiology

## 2024-07-11 ENCOUNTER — Other Ambulatory Visit: Payer: Self-pay

## 2024-07-11 ENCOUNTER — Encounter (HOSPITAL_COMMUNITY)
Admission: RE | Disposition: A | Discharge status: Home / Self Care | Payer: Self-pay | Source: Ambulatory Visit | Attending: Orthopedic Surgery

## 2024-07-11 ENCOUNTER — Ambulatory Visit (HOSPITAL_COMMUNITY)
Admission: RE | Admit: 2024-07-11 | Discharge: 2024-07-11 | Disposition: A | Discharge status: Home / Self Care | Source: Ambulatory Visit | Attending: Orthopedic Surgery | Admitting: Orthopedic Surgery

## 2024-07-11 ENCOUNTER — Encounter (HOSPITAL_COMMUNITY): Payer: Self-pay | Admitting: Orthopedic Surgery

## 2024-07-11 ENCOUNTER — Ambulatory Visit (HOSPITAL_COMMUNITY): Payer: Self-pay | Admitting: Physician Assistant

## 2024-07-11 ENCOUNTER — Ambulatory Visit (HOSPITAL_COMMUNITY)

## 2024-07-11 DIAGNOSIS — K219 Gastro-esophageal reflux disease without esophagitis: Secondary | ICD-10-CM | POA: Diagnosis not present

## 2024-07-11 DIAGNOSIS — M25751 Osteophyte, right hip: Secondary | ICD-10-CM | POA: Diagnosis not present

## 2024-07-11 DIAGNOSIS — M1611 Unilateral primary osteoarthritis, right hip: Secondary | ICD-10-CM | POA: Diagnosis present

## 2024-07-11 DIAGNOSIS — I10 Essential (primary) hypertension: Secondary | ICD-10-CM | POA: Diagnosis not present

## 2024-07-11 DIAGNOSIS — F17210 Nicotine dependence, cigarettes, uncomplicated: Secondary | ICD-10-CM | POA: Insufficient documentation

## 2024-07-11 HISTORY — PX: TOTAL HIP ARTHROPLASTY: SHX124

## 2024-07-11 LAB — TYPE AND SCREEN
ABO/RH(D): O POS
Antibody Screen: NEGATIVE

## 2024-07-11 SURGERY — ARTHROPLASTY, HIP, TOTAL,POSTERIOR APPROACH
Anesthesia: Spinal | Site: Hip | Laterality: Right

## 2024-07-11 MED ORDER — CEFAZOLIN SODIUM-DEXTROSE 2-4 GM/100ML-% IV SOLN
2.0000 g | INTRAVENOUS | Status: AC
  Administered 2024-07-11: 2 g via INTRAVENOUS
  Filled 2024-07-11: qty 100

## 2024-07-11 MED ORDER — FENTANYL CITRATE (PF) 100 MCG/2ML IJ SOLN
INTRAMUSCULAR | Status: DC | PRN
  Administered 2024-07-11 (×2): 50 ug via INTRAVENOUS

## 2024-07-11 MED ORDER — TRANEXAMIC ACID-NACL 1000-0.7 MG/100ML-% IV SOLN
INTRAVENOUS | Status: AC
  Filled 2024-07-11: qty 100

## 2024-07-11 MED ORDER — MUPIROCIN 2 % EX OINT: 1.0000 | TOPICAL_OINTMENT | Freq: Two times a day (BID) | CUTANEOUS | 0 refills | Status: AC

## 2024-07-11 MED ORDER — KETOROLAC TROMETHAMINE 30 MG/ML IJ SOLN
INTRAMUSCULAR | Status: DC | PRN
  Administered 2024-07-11: 30 mg

## 2024-07-11 MED ORDER — LIDOCAINE HCL (CARDIAC) PF 100 MG/5ML IV SOSY
PREFILLED_SYRINGE | INTRAVENOUS | Status: DC | PRN
  Administered 2024-07-11: 40 mg via INTRAVENOUS

## 2024-07-11 MED ORDER — LACTATED RINGERS IV SOLN: INTRAVENOUS | Status: DC

## 2024-07-11 MED ORDER — MIDAZOLAM HCL 2 MG/2ML IJ SOLN
INTRAMUSCULAR | Status: AC
  Filled 2024-07-11: qty 2

## 2024-07-11 MED ORDER — POVIDONE-IODINE 10 % EX SWAB
2.0000 | Freq: Once | CUTANEOUS | Status: DC
Start: 2024-07-11 — End: 2024-07-11

## 2024-07-11 MED ORDER — STERILE WATER FOR IRRIGATION IR SOLN
Status: DC | PRN
  Administered 2024-07-11: 2000 mL

## 2024-07-11 MED ORDER — TRANEXAMIC ACID-NACL 1000-0.7 MG/100ML-% IV SOLN
1000.0000 mg | INTRAVENOUS | Status: AC
  Administered 2024-07-11: 1000 mg via INTRAVENOUS
  Filled 2024-07-11: qty 100

## 2024-07-11 MED ORDER — DROPERIDOL 2.5 MG/ML IJ SOLN: 0.6250 mg | Freq: Once | INTRAMUSCULAR | Status: DC | PRN

## 2024-07-11 MED ORDER — ACETAMINOPHEN 500 MG PO TABS
1000.0000 mg | ORAL_TABLET | Freq: Once | ORAL | Status: AC
  Administered 2024-07-11: 1000 mg via ORAL
  Filled 2024-07-11: qty 2

## 2024-07-11 MED ORDER — METHOCARBAMOL 1000 MG/10ML IJ SOLN: 500.0000 mg | Freq: Four times a day (QID) | INTRAMUSCULAR | Status: DC | PRN

## 2024-07-11 MED ORDER — POVIDONE-IODINE 10 % EX SWAB: 2.0000 | Freq: Once | CUTANEOUS | Status: DC

## 2024-07-11 MED ORDER — KETOROLAC TROMETHAMINE 30 MG/ML IJ SOLN
INTRAMUSCULAR | Status: AC
  Filled 2024-07-11: qty 1

## 2024-07-11 MED ORDER — DEXMEDETOMIDINE HCL IN NACL 80 MCG/20ML IV SOLN
INTRAVENOUS | Status: AC
  Filled 2024-07-11: qty 20

## 2024-07-11 MED ORDER — RIVAROXABAN 10 MG PO TABS: 10.0000 mg | ORAL_TABLET | Freq: Every day | ORAL | 0 refills | Status: AC

## 2024-07-11 MED ORDER — DEXAMETHASONE SOD PHOSPHATE PF 10 MG/ML IJ SOLN
INTRAMUSCULAR | Status: DC | PRN
  Administered 2024-07-11 (×2): 4 mg via INTRAVENOUS

## 2024-07-11 MED ORDER — ACETAMINOPHEN 500 MG PO TABS: 1000.0000 mg | ORAL_TABLET | Freq: Four times a day (QID) | ORAL | Status: DC

## 2024-07-11 MED ORDER — OXYCODONE HCL 5 MG PO TABS
ORAL_TABLET | ORAL | Status: AC
  Filled 2024-07-11: qty 1

## 2024-07-11 MED ORDER — TRANEXAMIC ACID-NACL 1000-0.7 MG/100ML-% IV SOLN
1000.0000 mg | Freq: Once | INTRAVENOUS | Status: AC
  Administered 2024-07-11: 1000 mg via INTRAVENOUS

## 2024-07-11 MED ORDER — HYDROMORPHONE HCL 1 MG/ML IJ SOLN: 0.5000 mg | INTRAMUSCULAR | Status: DC | PRN

## 2024-07-11 MED ORDER — ORAL CARE MOUTH RINSE: 15.0000 mL | Freq: Once | OROMUCOSAL | Status: AC

## 2024-07-11 MED ORDER — BUPIVACAINE HCL (PF) 0.25 % IJ SOLN
INTRAMUSCULAR | Status: AC
  Filled 2024-07-11: qty 30

## 2024-07-11 MED ORDER — PHENYLEPHRINE HCL-NACL 20-0.9 MG/250ML-% IV SOLN
INTRAVENOUS | Status: DC | PRN
  Administered 2024-07-11: 30 ug/min via INTRAVENOUS

## 2024-07-11 MED ORDER — OXYCODONE HCL 5 MG PO TABS: 5.0000 mg | ORAL_TABLET | ORAL | 0 refills | Status: AC | PRN

## 2024-07-11 MED ORDER — ONDANSETRON HCL 4 MG/2ML IJ SOLN: 4.0000 mg | Freq: Four times a day (QID) | INTRAMUSCULAR | Status: DC | PRN

## 2024-07-11 MED ORDER — CEFAZOLIN SODIUM-DEXTROSE 2-4 GM/100ML-% IV SOLN
2.0000 g | Freq: Four times a day (QID) | INTRAVENOUS | Status: DC
  Administered 2024-07-11: 2 g via INTRAVENOUS

## 2024-07-11 MED ORDER — METHOCARBAMOL 500 MG PO TABS
ORAL_TABLET | ORAL | Status: AC
  Filled 2024-07-11: qty 1

## 2024-07-11 MED ORDER — ONDANSETRON HCL 4 MG PO TABS: 4.0000 mg | ORAL_TABLET | Freq: Four times a day (QID) | ORAL | Status: DC | PRN

## 2024-07-11 MED ORDER — METOCLOPRAMIDE HCL 5 MG PO TABS: 5.0000 mg | ORAL_TABLET | Freq: Three times a day (TID) | ORAL | Status: DC | PRN

## 2024-07-11 MED ORDER — OXYCODONE HCL 5 MG PO TABS
5.0000 mg | ORAL_TABLET | ORAL | Status: DC | PRN
  Administered 2024-07-11: 5 mg via ORAL

## 2024-07-11 MED ORDER — MIDAZOLAM HCL 5 MG/5ML IJ SOLN
INTRAMUSCULAR | Status: DC | PRN
  Administered 2024-07-11 (×2): 1 mg via INTRAVENOUS

## 2024-07-11 MED ORDER — CHLORHEXIDINE GLUCONATE 0.12 % MT SOLN
15.0000 mL | Freq: Once | OROMUCOSAL | Status: AC
  Administered 2024-07-11: 15 mL via OROMUCOSAL

## 2024-07-11 MED ORDER — 0.9 % SODIUM CHLORIDE (POUR BTL) OPTIME
TOPICAL | Status: DC | PRN
  Administered 2024-07-11: 1000 mL

## 2024-07-11 MED ORDER — METOCLOPRAMIDE HCL 5 MG/ML IJ SOLN: 5.0000 mg | Freq: Three times a day (TID) | INTRAMUSCULAR | Status: DC | PRN

## 2024-07-11 MED ORDER — LACTATED RINGERS IV BOLUS
250.0000 mL | Freq: Once | INTRAVENOUS | Status: AC
  Administered 2024-07-11: 250 mL via INTRAVENOUS

## 2024-07-11 MED ORDER — OXYCODONE HCL 5 MG PO TABS: 5.0000 mg | ORAL_TABLET | Freq: Once | ORAL | Status: DC | PRN

## 2024-07-11 MED ORDER — OXYCODONE HCL 5 MG PO TABS: 10.0000 mg | ORAL_TABLET | ORAL | Status: DC | PRN

## 2024-07-11 MED ORDER — FENTANYL CITRATE (PF) 100 MCG/2ML IJ SOLN
INTRAMUSCULAR | Status: AC
  Filled 2024-07-11: qty 2

## 2024-07-11 MED ORDER — FENTANYL CITRATE (PF) 50 MCG/ML IJ SOSY
25.0000 ug | PREFILLED_SYRINGE | INTRAMUSCULAR | Status: DC | PRN
  Administered 2024-07-11: 50 ug via INTRAVENOUS

## 2024-07-11 MED ORDER — ONDANSETRON HCL 4 MG/2ML IJ SOLN
INTRAMUSCULAR | Status: DC | PRN
  Administered 2024-07-11: 4 mg via INTRAVENOUS

## 2024-07-11 MED ORDER — SENNA-DOCUSATE SODIUM 8.6-50 MG PO TABS: 2.0000 | ORAL_TABLET | Freq: Every day | ORAL | 1 refills | Status: AC

## 2024-07-11 MED ORDER — PROPOFOL 500 MG/50ML IV EMUL
INTRAVENOUS | Status: DC | PRN
  Administered 2024-07-11: 50 ug/kg/min via INTRAVENOUS

## 2024-07-11 MED ORDER — LACTATED RINGERS IV BOLUS
500.0000 mL | Freq: Once | INTRAVENOUS | Status: AC
  Administered 2024-07-11: 500 mL via INTRAVENOUS

## 2024-07-11 MED ORDER — CEFAZOLIN SODIUM-DEXTROSE 2-4 GM/100ML-% IV SOLN
INTRAVENOUS | Status: AC
Start: 2024-07-11 — End: 2024-07-11
  Filled 2024-07-11: qty 100

## 2024-07-11 MED ORDER — PHENYLEPHRINE HCL-NACL 20-0.9 MG/250ML-% IV SOLN
INTRAVENOUS | Status: AC
  Filled 2024-07-11: qty 250

## 2024-07-11 MED ORDER — POVIDONE-IODINE 7.5 % EX SOLN: Freq: Once | CUTANEOUS | Status: DC

## 2024-07-11 MED ORDER — OXYCODONE HCL 5 MG/5ML PO SOLN: 5.0000 mg | Freq: Once | ORAL | Status: DC | PRN

## 2024-07-11 MED ORDER — EPHEDRINE SULFATE (PRESSORS) 25 MG/5ML IV SOSY
PREFILLED_SYRINGE | INTRAVENOUS | Status: DC | PRN
  Administered 2024-07-11 (×2): 5 mg via INTRAVENOUS

## 2024-07-11 MED ORDER — LACTATED RINGERS IV BOLUS: 250.0000 mL | Freq: Once | INTRAVENOUS | Status: DC

## 2024-07-11 MED ORDER — ONDANSETRON HCL 4 MG PO TABS: 4.0000 mg | ORAL_TABLET | Freq: Three times a day (TID) | ORAL | 0 refills | Status: AC | PRN

## 2024-07-11 MED ORDER — BUPIVACAINE IN DEXTROSE 0.75-8.25 % IT SOLN
INTRATHECAL | Status: DC | PRN
  Administered 2024-07-11: 1.8 mL via INTRATHECAL

## 2024-07-11 MED ORDER — FENTANYL CITRATE (PF) 50 MCG/ML IJ SOSY
PREFILLED_SYRINGE | INTRAMUSCULAR | Status: AC
  Filled 2024-07-11: qty 1

## 2024-07-11 MED ORDER — METHOCARBAMOL 500 MG PO TABS
500.0000 mg | ORAL_TABLET | Freq: Four times a day (QID) | ORAL | Status: DC | PRN
Start: 2024-07-11 — End: 2024-07-11
  Administered 2024-07-11: 500 mg via ORAL

## 2024-07-11 MED ORDER — BUPIVACAINE HCL (PF) 0.25 % IJ SOLN
INTRAMUSCULAR | Status: DC | PRN
  Administered 2024-07-11: 30 mL

## 2024-07-11 MED ORDER — GLYCOPYRROLATE 0.2 MG/ML IJ SOLN
INTRAMUSCULAR | Status: DC | PRN
  Administered 2024-07-11: .2 mg via INTRAVENOUS

## 2024-07-11 MED ORDER — CHLORHEXIDINE GLUCONATE 4 % EX SOLN: 1.0000 | CUTANEOUS | 1 refills | Status: AC

## 2024-07-11 MED ORDER — DEXMEDETOMIDINE HCL IN NACL 80 MCG/20ML IV SOLN
INTRAVENOUS | Status: DC | PRN
  Administered 2024-07-11: 8 ug via INTRAVENOUS

## 2024-07-11 SURGICAL SUPPLY — 49 items
ANCHOR SUT KEITH ABD SZ2 STR (SUTURE) ×1 IMPLANT
BAG COUNTER SPONGE SURGICOUNT (BAG) IMPLANT
BIT DRILL 2.0X128 (BIT) ×1 IMPLANT
BLADE SAW SGTL 73X25 THK (BLADE) ×1 IMPLANT
CLSR STERI-STRIP ANTIMIC 1/2X4 (GAUZE/BANDAGES/DRESSINGS) ×2 IMPLANT
COVER SURGICAL LIGHT HANDLE (MISCELLANEOUS) ×1 IMPLANT
DRAPE INCISE IOBAN 66X45 STRL (DRAPES) ×1 IMPLANT
DRAPE POUCH INSTRU U-SHP 10X18 (DRAPES) ×1 IMPLANT
DRAPE SHEET LG 3/4 BI-LAMINATE (DRAPES) ×1 IMPLANT
DRAPE SURG 17X11 SM STRL (DRAPES) ×1 IMPLANT
DRAPE SURG ORHT 6 SPLT 77X108 (DRAPES) ×2 IMPLANT
DRAPE U-SHAPE 47X51 STRL (DRAPES) ×1 IMPLANT
DRSG MEPILEX POST OP 4X12 (GAUZE/BANDAGES/DRESSINGS) ×1 IMPLANT
DURAPREP 26ML APPLICATOR (WOUND CARE) ×2 IMPLANT
ELECT BLADE TIP CTD 4 INCH (ELECTRODE) ×1 IMPLANT
ELECT PENCIL ROCKER SW 15FT (MISCELLANEOUS) ×1 IMPLANT
ELECT REM PT RETURN 15FT ADLT (MISCELLANEOUS) ×1 IMPLANT
ELIMINATOR HOLE APEX DEPUY (Hips) IMPLANT
FACESHIELD WRAPAROUND OR TEAM (MASK) ×1 IMPLANT
GLOVE BIO SURGEON STRL SZ 6.5 (GLOVE) ×1 IMPLANT
GLOVE BIO SURGEON STRL SZ7.5 (GLOVE) ×1 IMPLANT
GLOVE BIOGEL PI IND STRL 7.0 (GLOVE) ×1 IMPLANT
GLOVE BIOGEL PI IND STRL 8 (GLOVE) ×1 IMPLANT
GOWN STRL SURGICAL XL XLNG (GOWN DISPOSABLE) ×2 IMPLANT
HEAD CERAMIC 36 PLUS 8.5 12 14 (Hips) IMPLANT
HOOD PEEL AWAY T7 (MISCELLANEOUS) ×3 IMPLANT
KIT BASIN OR (CUSTOM PROCEDURE TRAY) ×1 IMPLANT
KIT TURNOVER KIT A (KITS) ×1 IMPLANT
LINER NEUTRAL 52X36MM PLUS 4 (Liner) IMPLANT
MANIFOLD NEPTUNE II (INSTRUMENTS) ×1 IMPLANT
NDL SAFETY ECLIPSE 18X1.5 (NEEDLE) ×2 IMPLANT
NS IRRIG 1000ML POUR BTL (IV SOLUTION) ×1 IMPLANT
PACK TOTAL JOINT (CUSTOM PROCEDURE TRAY) ×1 IMPLANT
PIN SECTOR W/GRIP ACE CUP 52MM (Hips) IMPLANT
PROTECTOR NERVE ULNAR (MISCELLANEOUS) ×1 IMPLANT
STEM FEM ACTIS STD SZ7 (Nail) IMPLANT
SUCTION TUBE FRAZIER 12FR DISP (SUCTIONS) ×1 IMPLANT
SUT ETHIBOND NAB CT1 #1 30IN (SUTURE) ×3 IMPLANT
SUT STRATAFIX 14 PDO 48 VLT (SUTURE) ×1 IMPLANT
SUT VIC AB 1 CT1 36 (SUTURE) ×2 IMPLANT
SUT VIC AB 2-0 CT1 TAPERPNT 27 (SUTURE) ×2 IMPLANT
SUT VIC AB 3-0 SH 27X BRD (SUTURE) ×2 IMPLANT
SYR 30ML LL (SYRINGE) ×1 IMPLANT
SYR 3ML LL SCALE MARK (SYRINGE) ×1 IMPLANT
TOWEL GREEN STERILE FF (TOWEL DISPOSABLE) ×1 IMPLANT
TOWEL OR 17X26 10 PK STRL BLUE (TOWEL DISPOSABLE) ×1 IMPLANT
TRAY FOLEY MTR SLVR 16FR STAT (SET/KITS/TRAYS/PACK) ×1 IMPLANT
TUBE SUCTION HIGH CAP CLEAR NV (SUCTIONS) ×1 IMPLANT
WATER STERILE IRR 1000ML POUR (IV SOLUTION) ×2 IMPLANT

## 2024-07-11 NOTE — Evaluation (Signed)
 Physical Therapy Evaluation Patient Details Name: Savannah Mcneil MRN: [id number] DOB: [birth year] Today's Date: 07/11/2024  History of Present Illness  58 yo female presents to therapy s/p R THA, posterior approach on 07/11/2024 due to failure of conservative measures. Pt is currently R LE WBAT and no formal hip precautions. Pt PMH includes but is not limited to: arthritis, asthma, diverticulitis, GERD, HA, lumbar fusion, tobacco abuse, HTN and L THA, posterior approach on 03/21/2024 .  Clinical Impression     Savannah Mcneil is a 58 y.o. female POD 0 s/p R THA. Patient reports mod I with mobility at baseline. Patient is now limited by functional impairments (see PT problem list below) and requires CGA for transfers and gait with RW. Patient was able to ambulate 55 and 50 feet with RW and CGA  and cues for safe walker management. Patient educated on safe sequencing for stair mobility with B handrails, safety and fall risk prevention, pain management and goal, slowly increasing activity, controlled movements especially with LE HEP, use of RW vs rollator, CP/ice and trunk transfer with running board pt and Savannah Mcneil verbalized understanding  safe guarding position for people assisting with mobility. Patient instructed in exercises to facilitate ROM and circulation reviewed and HO provided. Patient will benefit from continued skilled PT interventions to address impairments and progress towards PLOF. Patient has met mobility goals at adequate level for discharge home with family support and OPPT services; will continue to follow if pt continues acute stay to progress towards Mod I goals.       If plan is discharge home, recommend the following: A little help with walking and/or transfers;A little help with bathing/dressing/bathroom;Assistance with cooking/housework;Assist for transportation;Help with stairs or ramp for entrance   Can travel by private vehicle        Equipment Recommendations None  recommended by PT  Recommendations for Other Services       Functional Status Assessment Patient has had a recent decline in their functional status and demonstrates the ability to make significant improvements in function in a reasonable and predictable amount of time.     Precautions / Restrictions Precautions Precautions: Fall Restrictions Weight Bearing Restrictions Per Provider Order: No      Mobility  Bed Mobility Overal bed mobility: Needs Assistance Bed Mobility: Supine to Sit     Supine to sit: Supervision     General bed mobility comments: min cues    Transfers Overall transfer level: Needs assistance Equipment used: Rolling walker (2 wheels) Transfers: Sit to/from Stand Sit to Stand: Contact guard assist           General transfer comment: min cues    Ambulation/Gait Ambulation/Gait assistance: Contact guard assist Gait Distance (Feet): 55 Feet Assistive device: Rolling walker (2 wheels) Gait Pattern/deviations: Step-to pattern, Decreased stance time - right, Antalgic, Trunk flexed Gait velocity: decreased     General Gait Details: slight trunk flexion with B UE support at RW to offload R LE min cues for safety and RW management  Stairs Stairs: Yes Stairs assistance: Contact guard assist Stair Management: Two rails Number of Stairs: 3 General stair comments: min cues for safety, sequencing and step to technique  Wheelchair Mobility     Tilt Bed    Modified Rankin (Stroke Patients Only)       Balance Overall balance assessment: Needs assistance Sitting-balance support: Feet supported Sitting balance-Leahy Scale: Good     Standing balance support: Bilateral upper extremity supported, During functional activity, Reliant on assistive device for  balance Standing balance-Leahy Scale: Poor                               Pertinent Vitals/Pain Pain Assessment Pain Assessment: 0-10 Pain Score: 5  Pain Location: R hip and  LE Pain Descriptors / Indicators: Aching, Constant, Discomfort, Dull, Grimacing, Operative site guarding Pain Intervention(s): Limited activity within patient's tolerance, Monitored during session, Premedicated before session, Repositioned, Ice applied    Home Living Family/patient expects to be discharged to:: Private residence Living Arrangements: Spouse/significant other;Other relatives;Children Available Help at Discharge: Family Type of Home: House Home Access: Stairs to enter Entrance Stairs-Rails: Right;Left;Can reach both Entrance Stairs-Number of Steps: 5   Home Layout: One level Home Equipment: Shower seat - built in;Rollator (4 wheels);Cane - single Librarian, Academic (2 wheels)      Prior Function Prior Level of Function : Independent/Modified Independent             Mobility Comments: mod I for all ADLs, self care tasks and IADLs intermittent use of SPC for short community distances and rollator for prolonged distances and uneven surfaces       Extremity/Trunk Assessment        Lower Extremity Assessment Lower Extremity Assessment: RLE deficits/detail RLE Deficits / Details: ankle DF/PF 5/5 RLE Sensation: WNL    Cervical / Trunk Assessment Cervical / Trunk Assessment: Normal  Communication   Communication Communication: Impaired    Cognition Arousal: Alert Behavior During Therapy: WFL for tasks assessed/performed   PT - Cognitive impairments: No apparent impairments                         Following commands: Intact       Cueing       General Comments      Exercises Total Joint Exercises Ankle Circles/Pumps: AROM, Both, 10 reps Quad Sets: AROM, Right, 5 reps Heel Slides: AROM, Right, 5 reps Hip ABduction/ADduction: AROM, Right, 5 reps, Standing Long Arc Quad: AROM, Right, 5 reps, Seated Knee Flexion: AROM, Right, 5 reps, Standing Standing Hip Extension: AROM, Right, 5 reps, Standing   Assessment/Plan    PT Assessment  Patient needs continued PT services  PT Problem List Decreased strength;Decreased range of motion;Decreased activity tolerance;Decreased balance;Decreased mobility;Decreased coordination;Pain       PT Treatment Interventions DME instruction;Gait training;Stair training;Functional mobility training;Therapeutic activities;Therapeutic exercise;Balance training;Neuromuscular re-education;Patient/family education;Modalities    PT Goals (Current goals can be found in the Care Plan section)  Acute Rehab PT Goals Patient Stated Goal: walk without a walker PT Goal Formulation: With patient Time For Goal Achievement: 07/25/24 Potential to Achieve Goals: Good    Frequency 7X/week     Co-evaluation               AM-PAC PT 6 Clicks Mobility  Outcome Measure Help needed turning from your back to your side while in a flat bed without using bedrails?: None Help needed moving from lying on your back to sitting on the side of a flat bed without using bedrails?: A Little Help needed moving to and from a bed to a chair (including a wheelchair)?: A Little Help needed standing up from a chair using your arms (e.g., wheelchair or bedside chair)?: A Little Help needed to walk in hospital room?: A Little Help needed climbing 3-5 steps with a railing? : A Little 6 Click Score: 19    End of Session Equipment Utilized During Treatment:  Gait belt Activity Tolerance: Patient tolerated treatment well Patient left: in chair;with call bell/phone within reach;with family/visitor present   PT Visit Diagnosis: Unsteadiness on feet (R26.81);Other abnormalities of gait and mobility (R26.89);Muscle weakness (generalized) (M62.81);Difficulty in walking, not elsewhere classified (R26.2);Pain Pain - Right/Left: Right Pain - part of body: Leg;Hip    Time: 1332-1401 PT Time Calculation (min) (ACUTE ONLY): 29 min   Charges:   PT Evaluation $PT Eval Low Complexity: 1 Low PT Treatments $Gait Training: 8-22  mins PT General Charges $$ ACUTE PT VISIT: 1 Visit         [place], PT Acute Rehab   [place] VEAR Drone 07/11/2024, 2:53 PM

## 2024-07-11 NOTE — Anesthesia Preprocedure Evaluation (Signed)
 Anesthesia Evaluation  Patient identified by MRN, date of birth, ID band Patient awake    Reviewed: Allergy & Precautions, NPO status , Patient's Chart, lab work & pertinent test results  History of Anesthesia Complications Negative for: history of anesthetic complications  Airway Mallampati: II  TM Distance: >3 FB Neck ROM: Full    Dental  (+) Edentulous Upper, Edentulous Lower   Pulmonary asthma , neg sleep apnea, neg COPD, Current Smoker and Patient abstained from smoking.   Pulmonary exam normal breath sounds clear to auscultation       Cardiovascular Exercise Tolerance: Good METShypertension, Pt. on medications (-) CAD and (-) Past MI (-) dysrhythmias  Rhythm:Regular Rate:Normal - Systolic murmurs    Neuro/Psych  Headaches  negative psych ROS   GI/Hepatic ,GERD  Medicated and Controlled,,(+)     (-) substance abuse    Endo/Other  neg diabetes    Renal/GU negative Renal ROS     Musculoskeletal  (+) Arthritis , Osteoarthritis,    Abdominal  (+) + obese  Peds  Hematology   Anesthesia Other Findings Past Medical History: No date: Arthritis No date: Asthma No date: Diverticulitis No date: GERD (gastroesophageal reflux disease) No date: Headache     Comment:  Hx of mirgranes No date: Hypertension No date: Neuromuscular disorder (HCC)     Comment:  carpal tunnel No date: Pneumonia  Reproductive/Obstetrics                              Anesthesia Physical Anesthesia Plan  ASA: 2  Anesthesia Plan: Spinal   Post-op Pain Management: Tylenol  PO (pre-op)*   Induction: Intravenous  PONV Risk Score and Plan: 1 and Ondansetron , Dexamethasone , Propofol  infusion, TIVA and Midazolam   Airway Management Planned: Natural Airway  Additional Equipment: None  Intra-op Plan:   Post-operative Plan:   Informed Consent: I have reviewed the patients History and Physical, chart, labs and  discussed the procedure including the risks, benefits and alternatives for the proposed anesthesia with the patient or authorized representative who has indicated his/her understanding and acceptance.       Plan Discussed with: CRNA and Surgeon  Anesthesia Plan Comments: (Discussed R/B/A of neuraxial anesthesia technique with patient: - rare risks of spinal/epidural hematoma, nerve damage, infection - Risk of PDPH - Risk of nausea and vomiting - Risk of conversion to general anesthesia and its associated risks, including sore throat, damage to lips/eyes/teeth/oropharynx, and rare risks such as cardiac and respiratory events. - Risk of allergic reactions  Patient counseled on benefits of smoking cessation, and increased perioperative risks associated with continued smoking.   Discussed the role of CRNA in patient's perioperative care.  Patient voiced understanding.)         Anesthesia Quick Evaluation

## 2024-07-11 NOTE — Op Note (Signed)
 07/11/2024  9:35 AM  PATIENT:  Savannah Mcneil   MRN: [id number]  PRE-OPERATIVE DIAGNOSIS: Right hip primary localized osteoarthritis  POST-OPERATIVE DIAGNOSIS:  same  PROCEDURE:  Procedure(s): ARTHROPLASTY, HIP, TOTAL,POSTERIOR APPROACH  PREOPERATIVE INDICATIONS:    Savannah Mcneil is an 58 y.o. female who has a diagnosis of right hip arthritis and elected for surgical management after failing conservative treatment.  The risks benefits and alternatives were discussed with the patient including but not limited to the risks of nonoperative treatment, versus surgical intervention including infection, bleeding, nerve injury, periprosthetic fracture, the need for revision surgery, dislocation, leg length discrepancy, blood clots, cardiopulmonary complications, morbidity, mortality, among others, and they were willing to proceed.     OPERATIVE REPORT     SURGEON:  Fonda Olmsted, MD    ASSISTANT:  Army Daring, PA-C, (Present throughout the entire procedure,  necessary for completion of procedure in a timely manner, assisting with retraction, instrumentation, and closure)     ANESTHESIA: Spinal  ESTIMATED BLOOD LOSS: 200 mL    COMPLICATIONS:  None.     UNIQUE ASPECTS OF THE CASE: Similar to the other side, she had substantial osteophytes around the acetabulum, which I removed both anteriorly and posteriorly.  I felt like I was matching the version anatomically, she felt very stable.  I did use an 8.5 neck ball length, which restored length compared to the other side.  I was little concerned the soft tissues would be tight, but they did close reasonably well.  The size 7 femur sat down nicely, and I matched her version although it was not a significant amount of anteversion.  She was very stable both anteriorly and posteriorly at the completion of the case.  COMPONENTS:  Implant Name: ARMAN DAKIN APEX DEPUY - [id number] Type: Hips Inv. Item: ELIMINATOR HOLE APEX DEPUY Serial  No.:  Manufacturer: [employer]PU[employer] Lot No.: [id number] LRB: Right No. Used: 1 Action: Implanted   Implant Name: PIN SECTOR W/GRIP ACE CUP - [id number] Type: Hips Inv. Item: PIN SECTOR W/GRIP ACE CUP Serial No.:  Manufacturer: [employer] Lot No.: [id number] LRB: Right No. Used: 1 Action: Implanted   Implant Name: STEM FEM ACTIS STD SZ7 - [id number] Type: Nail Inv. Item: STEM FEM ACTIS STD SZ7 Serial No.:  Manufacturer: [employer] Lot No.: [id number] LRB: Right No. Used: 1 Action: Implanted   Implant Name: HEAD CERAMIC 36 PLUS 8.5 12 14  - [id number] Type: Hips Inv. Item: HEAD CERAMIC 36 PLUS 8.5 12 14  Serial No.:  Manufacturer: [employer] Lot No.: [id number] LRB: Right No. Used: 1 Action: Implanted   Implant Name: LINER NEUTRAL 52X36MM PLUS 4 - [id number] Type: Liner Inv. Item: LINER NEUTRAL 52X36MM PLUS 4 Serial No.:  Manufacturer: [employer] Lot No.: [id number] LRB: Right No. Used: 1 Action: Implanted     PROCEDURE IN DETAIL:   The patient was met in the holding area and  identified.  The appropriate hip was identified and marked at the operative site.  The patient was then transported to the OR  and  placed under anesthesia.  At that point, the patient was  placed in the lateral decubitus position with the operative side up and  secured to the operating room table and all bony prominences padded.     The operative lower extremity was prepped from the iliac crest to the distal leg.  Sterile draping was performed.  Time out was performed prior to incision.      A routine posterolateral approach was utilized  via sharp dissection  carried down through the subcutaneous tissue.  Gross bleeders were Bovie coagulated.  The iliotibial band was identified and incised along the length of the skin incision.  Self-retaining retractors were  inserted.  With the hip internally rotated, the short external rotators  were identified. The  piriformis and capsule was tagged with Ethibond, and the hip capsule released in a T-type fashion.  The femoral neck was exposed, and I resected the femoral neck using the appropriate jig. This was performed at approximately a thumb's breadth above the lesser trochanter.    I then exposed the deep acetabulum, cleared out any tissue including the ligamentum teres.  A wing retractor was placed.  After adequate visualization, I excised the labrum, and then sequentially reamed.  I placed the trial acetabulum, which seated nicely, and then impacted the real cup into place.  Appropriate version and inclination was confirmed clinically matching their bony anatomy, and also with the use of the jig.  I placed a cancellous screw to augment fixation.  A trial polyethylene liner was placed and the wing retractor removed.    I then prepared the proximal femur using the cookie-cutter, the lateralizing reamer, and then sequentially reamed and broached.  A trial broach, neck, and head was utilized, and I reduced the hip and it was found to have excellent stability with functional range of motion. The trial components were then removed, and the real polyethylene liner was placed.  I then impacted the real femoral prosthesis into place into the appropriate version, slightly anteverted to the normal anatomy, and I impacted the real head ball into place. The hip was then reduced and taken through functional range of motion and found to have excellent stability. Leg lengths were restored.  I then used a 2 mm drill bits to pass the Ethibond suture from the capsule and piriformis through the greater trochanter, and secured this. Excellent posterior capsular repair was achieved. I also closed the T in the capsule.  I then irrigated the hip copiously again, and repaired the fascia with Stratafix, followed by Vicryl for the subcutaneous tissue, Monocryl for the skin, Steri-Strips and sterile gauze. The wounds were injected.  The patient was then awakened and returned to PACU in stable and satisfactory condition. There were no complications.  Fonda Olmsted, MD Orthopedic Surgeon [phone]   07/11/2024 9:35 AM

## 2024-07-11 NOTE — Transfer of Care (Signed)
 Immediate Anesthesia Transfer of Care Note  Patient: Savannah Mcneil  Procedure(s) Performed: ARTHROPLASTY, HIP, TOTAL,POSTERIOR APPROACH (Right: Hip)  Patient Location: PACU  Anesthesia Type:Spinal  Level of Consciousness: awake, alert , oriented, and patient cooperative  Airway & Oxygen Therapy: Patient Spontanous Breathing and Patient connected to face mask oxygen  Post-op Assessment: Report given to RN and Post -op Vital signs reviewed and stable  Post vital signs: Reviewed and stable  Last Vitals:  Vitals Value Taken Time  BP 132/57 07/11/24 09:45  Temp 35.8 C 07/11/24 09:43  Pulse 51 07/11/24 09:50  Resp 13 07/11/24 09:50  SpO2 100 % 07/11/24 09:50  Vitals shown include unfiled device data.  Last Pain:  Vitals:   07/11/24 0633  TempSrc:   PainSc: 6          Complications: No notable events documented.

## 2024-07-11 NOTE — Interval H&P Note (Signed)
 History and Physical Interval Note:  07/11/2024 7:20 AM  Savannah Mcneil  has presented today for surgery, with the diagnosis of osteoarthritis of right hip.  The various methods of treatment have been discussed with the patient and family. After consideration of risks, benefits and other options for treatment, the patient has consented to  Procedure(s): ARTHROPLASTY, HIP, TOTAL,POSTERIOR APPROACH (Right) as a surgical intervention.  The patient's history has been reviewed, patient examined, no change in status, stable for surgery.  I have reviewed the patient's chart and labs.  Questions were answered to the patient's satisfaction.     Fonda SHAUNNA Olmsted

## 2024-07-11 NOTE — Anesthesia Procedure Notes (Signed)
 Spinal  Patient location during procedure: OR Start time: 07/11/2024 7:43 AM End time: 07/11/2024 7:47 AM Reason for block: surgical anesthesia Staffing Performed: resident/CRNA  Resident/CRNA: Kathern Rollene LABOR, CRNA Performed by: Kathern Rollene LABOR, CRNA Authorized by: Boone Fess, MD   Preanesthetic Checklist Completed: patient identified, IV checked, site marked, risks and benefits discussed, surgical consent, monitors and equipment checked, pre-op evaluation and timeout performed Spinal Block Patient position: sitting Prep: DuraPrep Patient monitoring: heart rate, cardiac monitor, continuous pulse ox and blood pressure Approach: midline Location: L3-4 Injection technique: single-shot Needle Needle type: Pencan  Needle gauge: 24 G Needle length: 9 cm Needle insertion depth: 5 cm Assessment Sensory level: T6 Events: CSF return

## 2024-07-11 NOTE — Discharge Instructions (Addendum)

## 2024-07-11 NOTE — Anesthesia Postprocedure Evaluation (Signed)
 Anesthesia Post Note  Patient: Savannah Mcneil  Procedure(s) Performed: ARTHROPLASTY, HIP, TOTAL,POSTERIOR APPROACH (Right: Hip)     Patient location during evaluation: PACU Anesthesia Type: Spinal Level of consciousness: oriented and awake and alert Pain management: pain level controlled Vital Signs Assessment: post-procedure vital signs reviewed and stable Respiratory status: spontaneous breathing, respiratory function stable and patient connected to nasal cannula oxygen Cardiovascular status: blood pressure returned to baseline and stable Postop Assessment: no headache, no backache, no apparent nausea or vomiting and spinal receding Anesthetic complications: no   No notable events documented.  Last Vitals:  Vitals:   07/11/24 1125 07/11/24 1134  BP: 128/78 (!) 145/68  Pulse: (!) 51   Resp: 13 15  Temp: (!) 36.3 C (!) 36.4 C  SpO2: 97%     Last Pain:  Vitals:   07/11/24 1134  TempSrc:   PainSc: 3                  Rome Ade

## 2024-07-12 ENCOUNTER — Encounter (HOSPITAL_COMMUNITY): Payer: Self-pay | Admitting: Orthopedic Surgery

## 2024-07-13 ENCOUNTER — Other Ambulatory Visit: Payer: Self-pay

## 2024-07-13 ENCOUNTER — Ambulatory Visit: Attending: Surgical

## 2024-07-13 DIAGNOSIS — R600 Localized edema: Secondary | ICD-10-CM | POA: Insufficient documentation

## 2024-07-13 DIAGNOSIS — M25552 Pain in left hip: Secondary | ICD-10-CM | POA: Diagnosis present

## 2024-07-13 DIAGNOSIS — M6281 Muscle weakness (generalized): Secondary | ICD-10-CM | POA: Diagnosis present

## 2024-07-13 DIAGNOSIS — R262 Difficulty in walking, not elsewhere classified: Secondary | ICD-10-CM | POA: Insufficient documentation

## 2024-07-13 NOTE — Therapy (Signed)
 OUTPATIENT PHYSICAL THERAPY LOWER EXTREMITY EVALUATION   Patient Name: Savannah Mcneil MRN: [id number] DOB:October 27, 1965, 58 y.o., female Today's Date: 07/14/2024  END OF SESSION:  PT End of Session - 07/14/24 0605     Visit Number 1    Number of Visits 17    Date for Recertification  09/15/24    Authorization Type [place] MEDICAID HEALTHY BLUE    Authorization - Visit Number 1    PT Start Time 1500    PT Stop Time 1545    PT Time Calculation (min) 45 min    Activity Tolerance Patient tolerated treatment well;Patient limited by pain    Behavior During Therapy WFL for tasks assessed/performed          Past Medical History:  Diagnosis Date   Arthritis    Asthma    Diverticulitis    GERD (gastroesophageal reflux disease)    Headache    Hx of mirgranes   Hypertension    Neuromuscular disorder (HCC)    carpal tunnel   Pneumonia    Past Surgical History:  Procedure Laterality Date   ABLATION     uterus   CARPAL TUNNEL RELEASE Right    COLECTOMY     COLONOSCOPY     DILATION AND CURETTAGE OF UTERUS     ESOPHAGOGASTRODUODENOSCOPY     ESOPHAGUS SURGERY     TOTAL HIP ARTHROPLASTY Left 03/21/2024   Procedure: ARTHROPLASTY, HIP, TOTAL,POSTERIOR APPROACH;  Surgeon: Josefina Chew, MD;  Location: [facility];  Service: Orthopedics;  Laterality: Left;   TOTAL HIP ARTHROPLASTY Right 07/11/2024   Procedure: ARTHROPLASTY, HIP, TOTAL,POSTERIOR APPROACH;  Surgeon: Josefina Chew, MD;  Location: [facility];  Service: Orthopedics;  Laterality: Right;   Patient Active Problem List   Diagnosis Date Noted   Osteoarthritis of left hip 09/23/2021   Primary osteoarthritis of both hips 07/17/2021   Patellofemoral pain syndrome of both knees 07/17/2021   H/O colectomy 12/12/2020   Arthritis 12/12/2020   Diverticulosis 12/12/2020   Herniated nucleus pulposus, L5-S1 02/04/2016   Asthma 11/02/2008    PCP: [facility]   REFERRING PROVIDER: Delores Army POUR, PA-C   REFERRING DIAG:  [phone] (ICD-10-CM) - Presence of right artificial hip joint    THERAPY DIAG:  Pain in left hip  Muscle weakness (generalized)  Localized edema  Difficulty in walking, not elsewhere classified  Rationale for Evaluation and Treatment: Rehabilitation  ONSET DATE: 07/11/24  SUBJECTIVE:   SUBJECTIVE STATEMENT: Pt reports she is having a lot more pain with this replacement than she did with the last.  PERTINENT HISTORY: High BMI, smokes, asthma. L THA 03/21/24, [facility]  PAIN:  Are you having pain? Yes: NPRS scale: 8/10 Pain location: R gluteal, lat hip, and thigh Pain description: Ache, throb Aggravating factors: movement of the R leg, standing and walking Relieving factors: pain medication  PRECAUTIONS: None  RED FLAGS: None   WEIGHT BEARING RESTRICTIONS: No  FALLS:  Has patient fallen in last 6 months? No  LIVING ENVIRONMENT: Lives with: lives with their family Lives in: House/apartment Stairs: Yes: External: 5 steps; can reach both Has following equipment at home: Single point cane, Walker - 4 wheeled, Marine Scientist  OCCUPATION: disability  PLOF: Independent with household mobility with device and Independent with community mobility with device  PATIENT GOALS: To improve ability to walk   OBJECTIVE:  Note: Objective measures were completed at Evaluation unless otherwise noted.  PATIENT SURVEYS:  LEFS: 1/80= 1%  COGNITION: Overall cognitive status: Within functional limits  for tasks assessed     SENSATION: WFL  EDEMA:  Swelling of the R hip  MUSCLE LENGTH: Hamstrings: Right NT deg; Left NT deg Debby test: Right NT deg; Left NT deg  POSTURE: rounded shoulders, forward head, and flexed trunk   PALPATION: TTP to the R peri-hip area  LOWER EXTREMITY ROM: Able to complete a complete heel slide into flexion Not able to fully extend her R leg with min knee flexion Active ROM Right eval Left eval  Hip flexion    Hip extension    Hip abduction     Hip adduction    Hip internal rotation    Hip external rotation    Knee flexion    Knee extension    Ankle dorsiflexion    Ankle plantarflexion    Ankle inversion    Ankle eversion     (Blank rows = not tested)  LOWER EXTREMITY MMT: Not able to complete a SLR Able to complete a LAQ Needs belt assist to complete hip abd in supine MMT Right eval Left eval  Hip flexion 2   Hip extension 2   Hip abduction 2   Hip adduction    Hip internal rotation    Hip external rotation 2   Knee flexion 3   Knee extension 3   Ankle dorsiflexion    Ankle plantarflexion    Ankle inversion    Ankle eversion     (Blank rows = not tested)  FUNCTIONAL TESTS:  5 times sit to stand: TBA when pt is able to walk c a SPC 2 minute walk test: TBA when pt is able to walk with a SPC  GAIT: Distance walked: 74' Assistive device utilized: Environmental Consultant - 4 wheeled Level of assistance: SBA Comments: Antalgic pattern over R LE, decreased pace                                                                                                                  TREATMENT DATE:  [facility] Adult PT Treatment:                                                DATE: 07/13/24 Therapeutic Exercise: Developed, instructed in, and pt completed therex as noted in HEP  Self Care: Use of cold pack every 2 hours as needed for 10-15 mins for pain and swelling management    PATIENT EDUCATION:  Education details: Eval findings, POC, HEP, self care Person educated: Patient and Spouse Education method: Explanation, Demonstration, Tactile cues, Verbal cues, and Handouts Education comprehension: verbalized understanding, returned demonstration, verbal cues required, and tactile cues required  HOME EXERCISE PROGRAM: Access Code: [email or web address] URL: [email or web address] Date: 07/13/2024 Prepared by: Dasie Daft  Exercises - Seated Long Arc Quad  - 3 x daily - 7 x weekly - 1 sets - 5-10 reps - 3 hold - Supine Gluteal Sets  -  1 x daily - 7 x weekly - 1 sets - 5-10 reps - 5 hold - Supine Quad Set  - 3 x daily - 7 x weekly - 1 sets - 5-10 reps - 5 hold - Supine Heel Slide with Strap (Mirrored)  - 3 x daily - 7 x weekly - 1 sets - 5-10 reps - 5 hold - Supine Hip Abduction (Mirrored)  - 3 x daily - 7 x weekly - 1 sets - 5-10 reps - 3 hold  ASSESSMENT:  CLINICAL IMPRESSION: Patient is a 58 y.o. female who was seen today for physical therapy evaluation and treatment for R THA, posterior approach. Pt presents to PT with decreased R LE strength and function following a THA 2 days previously. Pt reports greater pain with the R THA vs the L THA this summer. Pt was instructed in and completed a HEP to promote ROM and strength of the R LE. Self care for use of a cold pain to assist in the management pain and swelling was also provided. Pt will benefit from skilled PT 2w8 to address impairments to optimize R LE function with less pain.   OBJECTIVE IMPAIRMENTS: decreased activity tolerance, decreased balance, decreased mobility, difficulty walking, decreased ROM, decreased strength, increased edema, pain, and high BMI.   ACTIVITY LIMITATIONS: carrying, lifting, bending, sitting, standing, squatting, sleeping, stairs, transfers, bed mobility, bathing, toileting, dressing, hygiene/grooming, locomotion level, and caring for others  PARTICIPATION LIMITATIONS: meal prep, cleaning, laundry, driving, shopping, and community activity  PERSONAL FACTORS: Fitness, Past/current experiences, Time since onset of injury/illness/exacerbation, and 3+ comorbidities: High BMI, smokes, asthma. L THA  are also affecting patient's functional outcome.   REHAB POTENTIAL: Good  CLINICAL DECISION MAKING: Evolving/moderate complexity  EVALUATION COMPLEXITY: Moderate   GOALS:  SHORT TERM GOALS: Target date: 08/04/24 Pt will be Ind in an initial HEP  Baseline: started Goal status: INITIAL  2.  Pt will demonstrate improved R LE strength, completing a  SLR x10, as indication to progress gait to assist with a SPC  Baseline: Able to to complete a SLR Goal status: INITIAL  3.  Pt's ambulation tolerance will increase to 250' ft c mod ind c a rollator/SPC for improved mobility in home and in the community  Baseline: 75' c rollator SBA Goal status: INITIAL  LONG TERM GOALS: Target date: 09/16/23  Pt will be Ind in a final HEP to maintain achieved LOF  Baseline: started Goal status: INITIAL  2.  Pt will demonstrate R hip strength to 4/5 and knee strength to 4+/5 to progress to ambulation  Baseline: se flow sheet Goal status: INITIAL  3.  Improve 5xSTS by MCID of 5 and by MCID of 7ft as indication of improved functional mobility  Baseline:TBA when pt is able to walk Ind c a SPC Goal status: INITIAL  4.  Pt will be ind in ambulation up to 500' and with asc/dsc 12 steps c HR for community mobility  Baseline: rollator c SBA Goal status: INITIAL  5.  Pt's LEFS will improve to 40% or greater as indication of improved function  Baseline: 1% Goal status: INITIAL   PLAN:  PT FREQUENCY: 2x/week  PT DURATION: 8 weeks  PLANNED INTERVENTIONS: 97164- PT Re-evaluation, 97110-Therapeutic exercises, 97530- Therapeutic activity, 97112- Neuromuscular re-education, 97535- Self Care, [id number]- Manual therapy, [id number]- Gait training, [phone]- Electrical stimulation (manual), Patient/Family education, Balance training, Stair training, Taping, Joint mobilization, Cryotherapy, and Moist heat  PLAN FOR NEXT SESSION: Assess response to HEP; progress therex as  indicated; use of modalities, and manual therapy as indicated.  Ahyan Kreeger MS, PT 07/14/24 6:27 AM  For all possible CPT codes, reference the Planned Interventions line above.     Check all conditions that are expected to impact treatment: {Conditions expected to impact treatment:Musculoskeletal disorders and Social determinants of health   If treatment provided at initial evaluation, no  treatment charged due to lack of authorization.

## 2024-07-17 ENCOUNTER — Ambulatory Visit

## 2024-07-17 DIAGNOSIS — M6281 Muscle weakness (generalized): Secondary | ICD-10-CM

## 2024-07-17 DIAGNOSIS — M25552 Pain in left hip: Secondary | ICD-10-CM

## 2024-07-17 DIAGNOSIS — R600 Localized edema: Secondary | ICD-10-CM

## 2024-07-17 NOTE — Therapy (Signed)
 OUTPATIENT PHYSICAL THERAPY TREATMENT   Patient Name: Savannah Mcneil MRN: [id number] DOB:[birth year], 58 y.o., female Today's Date: 07/17/2024  END OF SESSION:  PT End of Session - 07/17/24 1025     Visit Number 2    Number of Visits 17    Date for Recertification  09/15/24    Authorization Type [place] MEDICAID HEALTHY BLUE    Authorization - Visit Number 2    PT Start Time 1023   arrived late   PT Stop Time 1055    PT Time Calculation (min) 32 min    Activity Tolerance Patient tolerated treatment well;Patient limited by pain    Behavior During Therapy WFL for tasks assessed/performed           Past Medical History:  Diagnosis Date   Arthritis    Asthma    Diverticulitis    GERD (gastroesophageal reflux disease)    Headache    Hx of mirgranes   Hypertension    Neuromuscular disorder (HCC)    carpal tunnel   Pneumonia    Past Surgical History:  Procedure Laterality Date   ABLATION     uterus   CARPAL TUNNEL RELEASE Right    COLECTOMY     COLONOSCOPY     DILATION AND CURETTAGE OF UTERUS     ESOPHAGOGASTRODUODENOSCOPY     ESOPHAGUS SURGERY     TOTAL HIP ARTHROPLASTY Left 03/21/2024   Procedure: ARTHROPLASTY, HIP, TOTAL,POSTERIOR APPROACH;  Surgeon: Josefina Chew, MD;  Location: [facility];  Service: Orthopedics;  Laterality: Left;   TOTAL HIP ARTHROPLASTY Right 07/11/2024   Procedure: ARTHROPLASTY, HIP, TOTAL,POSTERIOR APPROACH;  Surgeon: Josefina Chew, MD;  Location: [facility];  Service: Orthopedics;  Laterality: Right;   Patient Active Problem List   Diagnosis Date Noted   Osteoarthritis of left hip 09/23/2021   Primary osteoarthritis of both hips 07/17/2021   Patellofemoral pain syndrome of both knees 07/17/2021   H/O colectomy 12/12/2020   Arthritis 12/12/2020   Diverticulosis 12/12/2020   Herniated nucleus pulposus, L5-S1 02/04/2016   Asthma 11/02/2008    PCP: [facility]   REFERRING PROVIDER: Delores Army POUR, PA-C   REFERRING DIAG:  [phone] (ICD-10-CM) - Presence of right artificial hip joint    THERAPY DIAG:  Pain in left hip  Muscle weakness (generalized)  Localized edema  Rationale for Evaluation and Treatment: Rehabilitation  ONSET DATE: 07/11/24  SUBJECTIVE:   SUBJECTIVE STATEMENT: Pt presents to PT with continued 7/10 pain in L hip. Has been compliant with HEP.   PERTINENT HISTORY: High BMI, smokes, asthma. L THA 03/21/24, HOH  PAIN:  Are you having pain? Yes: NPRS scale: 8/10 Pain location: R gluteal, lat hip, and thigh Pain description: Ache, throb Aggravating factors: movement of the R leg, standing and walking Relieving factors: pain medication  PRECAUTIONS: None  RED FLAGS: None   WEIGHT BEARING RESTRICTIONS: No  FALLS:  Has patient fallen in last 6 months? No  LIVING ENVIRONMENT: Lives with: lives with their family Lives in: House/apartment Stairs: Yes: External: 5 steps; can reach both Has following equipment at home: Single point cane, Walker - 4 wheeled, Marine Scientist  OCCUPATION: disability  PLOF: Independent with household mobility with device and Independent with community mobility with device  PATIENT GOALS: To improve ability to walk   OBJECTIVE:  Note: Objective measures were completed at Evaluation unless otherwise noted.  PATIENT SURVEYS:  LEFS: 1/80= 1%  COGNITION: Overall cognitive status: Within functional limits for tasks assessed  SENSATION: WFL  EDEMA:  Swelling of the R hip  MUSCLE LENGTH: Hamstrings: Right NT deg; Left NT deg Debby test: Right NT deg; Left NT deg  POSTURE: rounded shoulders, forward head, and flexed trunk   PALPATION: TTP to the R peri-hip area  LOWER EXTREMITY ROM: Able to complete a complete heel slide into flexion Not able to fully extend her R leg with min knee flexion Active ROM Right eval Left eval  Hip flexion    Hip extension    Hip abduction    Hip adduction    Hip internal rotation    Hip external  rotation    Knee flexion    Knee extension    Ankle dorsiflexion    Ankle plantarflexion    Ankle inversion    Ankle eversion     (Blank rows = not tested)  LOWER EXTREMITY MMT: Not able to complete a SLR Able to complete a LAQ Needs belt assist to complete hip abd in supine MMT Right eval Left eval  Hip flexion 2   Hip extension 2   Hip abduction 2   Hip adduction    Hip internal rotation    Hip external rotation 2   Knee flexion 3   Knee extension 3   Ankle dorsiflexion    Ankle plantarflexion    Ankle inversion    Ankle eversion     (Blank rows = not tested)  FUNCTIONAL TESTS:  5 times sit to stand: TBA when pt is able to walk c a SPC 2 minute walk test: TBA when pt is able to walk with a SPC  GAIT: Distance walked: 83' Assistive device utilized: Environmental Consultant - 4 wheeled Level of assistance: SBA Comments: Antalgic pattern over R LE, decreased pace                                                                                                                  TREATMENT DATE:  [facility] Adult PT Treatment:                                                DATE: 07/17/24 Supine heel slide 2x10 - 5 hold R Hooklying ball squeeze 2x10 - 5 hold Hooklying clamshell 2x10 GTB LAQ 2x10 2.5# L STS 2x10 - high table with UE  [facility] Adult PT Treatment:                                                DATE: 07/13/24 Therapeutic Exercise: Developed, instructed in, and pt completed therex as noted in HEP  Self Care: Use of cold pack every 2 hours as needed for 10-15 mins for pain and swelling management    PATIENT EDUCATION:  Education details: Eval findings, POC, HEP,  self care Person educated: Patient and Spouse Education method: Explanation, Demonstration, Tactile cues, Verbal cues, and Handouts Education comprehension: verbalized understanding, returned demonstration, verbal cues required, and tactile cues required  HOME EXERCISE PROGRAM: Access Code: [email or web address] URL:  [email or web address] Date: 07/17/2024 Prepared by: Alm Kingdom  Exercises - Seated Long Arc Quad  - 3 x daily - 7 x weekly - 1 sets - 5-10 reps - 3 hold - Supine Gluteal Sets  - 1 x daily - 7 x weekly - 1 sets - 5-10 reps - 5 hold - Supine Quad Set  - 3 x daily - 7 x weekly - 1 sets - 5-10 reps - 5 hold - Supine Heel Slide with Strap (Mirrored)  - 3 x daily - 7 x weekly - 1 sets - 5-10 reps - 5 hold - Supine Hip Abduction (Mirrored)  - 3 x daily - 7 x weekly - 1 sets - 5-10 reps - 3 hold - Hooklying Clamshell with Resistance  - 3 x daily - 7 x weekly - 3 sets - 10 reps - green band hold  ASSESSMENT:  CLINICAL IMPRESSION: Pt was able to complete all prescribed exercises with no adverse effect. Today we worked on improving LE strength and functional mobility post R THA. Pt is progressing with therapy, showing improved mobility today. Will continue to progress as able per POC.   EVAL: Patient is a 58 y.o. female who was seen today for physical therapy evaluation and treatment for R THA, posterior approach. Pt presents to PT with decreased R LE strength and function following a THA 2 days previously. Pt reports greater pain with the R THA vs the L THA this summer. Pt was instructed in and completed a HEP to promote ROM and strength of the R LE. Self care for use of a cold pain to assist in the management pain and swelling was also provided. Pt will benefit from skilled PT 2w8 to address impairments to optimize R LE function with less pain.   OBJECTIVE IMPAIRMENTS: decreased activity tolerance, decreased balance, decreased mobility, difficulty walking, decreased ROM, decreased strength, increased edema, pain, and high BMI.   ACTIVITY LIMITATIONS: carrying, lifting, bending, sitting, standing, squatting, sleeping, stairs, transfers, bed mobility, bathing, toileting, dressing, hygiene/grooming, locomotion level, and caring for others  PARTICIPATION LIMITATIONS: meal prep, cleaning,  laundry, driving, shopping, and community activity  PERSONAL FACTORS: Fitness, Past/current experiences, Time since onset of injury/illness/exacerbation, and 3+ comorbidities: High BMI, smokes, asthma. L THA  are also affecting patient's functional outcome.   REHAB POTENTIAL: Good  CLINICAL DECISION MAKING: Evolving/moderate complexity  EVALUATION COMPLEXITY: Moderate   GOALS:  SHORT TERM GOALS: Target date: 08/04/24 Pt will be Ind in an initial HEP  Baseline: started Goal status: INITIAL  2.  Pt will demonstrate improved R LE strength, completing a SLR x10, as indication to progress gait to assist with a SPC  Baseline: Able to to complete a SLR Goal status: INITIAL  3.  Pt's ambulation tolerance will increase to 250' ft c mod ind c a rollator/SPC for improved mobility in home and in the community  Baseline: 75' c rollator SBA Goal status: INITIAL  LONG TERM GOALS: Target date: 09/16/23  Pt will be Ind in a final HEP to maintain achieved LOF  Baseline: started Goal status: INITIAL  2.  Pt will demonstrate R hip strength to 4/5 and knee strength to 4+/5 to progress to ambulation  Baseline: se flow sheet Goal status: INITIAL  3.  Improve 5xSTS by  MCID of 5 and by MCID of 67ft as indication of improved functional mobility  Baseline:TBA when pt is able to walk Ind c a SPC Goal status: INITIAL  4.  Pt will be ind in ambulation up to 500' and with asc/dsc 12 steps c HR for community mobility  Baseline: rollator c SBA Goal status: INITIAL  5.  Pt's LEFS will improve to 40% or greater as indication of improved function  Baseline: 1% Goal status: INITIAL   PLAN:  PT FREQUENCY: 2x/week  PT DURATION: 8 weeks  PLANNED INTERVENTIONS: 97164- PT Re-evaluation, 97110-Therapeutic exercises, 97530- Therapeutic activity, 97112- Neuromuscular re-education, 97535- Self Care, 02859- Manual therapy, [id number]- Gait training, [phone]- Electrical stimulation (manual), Patient/Family  education, Balance training, Stair training, Taping, Joint mobilization, Cryotherapy, and Moist heat  PLAN FOR NEXT SESSION: Assess response to HEP; progress therex as indicated; use of modalities, and manual therapy as indicated.  Allen Ralls MS, PT 07/17/24 12:18 PM

## 2024-07-18 NOTE — Therapy (Signed)
 OUTPATIENT PHYSICAL THERAPY TREATMENT   Patient Name: Savannah Mcneil MRN: [id number] DOB:December 07, 1965, 58 y.o., female Today's Date: 07/19/2024  END OF SESSION:  PT End of Session - 07/19/24 1526     Visit Number 3    Number of Visits 17    Date for Recertification  09/15/24    Authorization Type [place] MEDICAID HEALTHY BLUE    Authorization Time Period 03/24/24-06/22/22    Authorization - Visit Number 3    Authorization - Number of Visits 11    PT Start Time 1500    PT Stop Time 1540    PT Time Calculation (min) 40 min    Activity Tolerance Patient tolerated treatment well;Patient limited by pain    Behavior During Therapy WFL for tasks assessed/performed            Past Medical History:  Diagnosis Date   Arthritis    Asthma    Diverticulitis    GERD (gastroesophageal reflux disease)    Headache    Hx of mirgranes   Hypertension    Neuromuscular disorder (HCC)    carpal tunnel   Pneumonia    Past Surgical History:  Procedure Laterality Date   ABLATION     uterus   CARPAL TUNNEL RELEASE Right    COLECTOMY     COLONOSCOPY     DILATION AND CURETTAGE OF UTERUS     ESOPHAGOGASTRODUODENOSCOPY     ESOPHAGUS SURGERY     TOTAL HIP ARTHROPLASTY Left 03/21/2024   Procedure: ARTHROPLASTY, HIP, TOTAL,POSTERIOR APPROACH;  Surgeon: Josefina Chew, MD;  Location: [facility];  Service: Orthopedics;  Laterality: Left;   TOTAL HIP ARTHROPLASTY Right 07/11/2024   Procedure: ARTHROPLASTY, HIP, TOTAL,POSTERIOR APPROACH;  Surgeon: Josefina Chew, MD;  Location: [facility];  Service: Orthopedics;  Laterality: Right;   Patient Active Problem List   Diagnosis Date Noted   Osteoarthritis of left hip 09/23/2021   Primary osteoarthritis of both hips 07/17/2021   Patellofemoral pain syndrome of both knees 07/17/2021   H/O colectomy 12/12/2020   Arthritis 12/12/2020   Diverticulosis 12/12/2020   Herniated nucleus pulposus, L5-S1 02/04/2016   Asthma 11/02/2008    [facility]P: [facility]   REFERRING PROVIDER: Delores Army POUR, PA-C   REFERRING DIAG: [phone] (ICD-10-CM) - Presence of right artificial hip joint    THERAPY DIAG:  Pain in left hip  Muscle weakness (generalized)  Localized edema  Difficulty in walking, not elsewhere classified  Rationale for Evaluation and Treatment: Rehabilitation  ONSET DATE: 07/11/24  SUBJECTIVE:   SUBJECTIVE STATEMENT: Pt reports she is improving slowly. Pt notes she has been experiencing cramping of her hamstring. Pt states she is using cold packs to manage the pain and swelling several times a day.  PERTINENT HISTORY: High BMI, smokes, asthma. L THA 03/21/24, HOH  PAIN:  Are you having pain? Yes: NPRS scale: 5/10 Pain location: R gluteal, lat hip, and thigh Pain description: Ache, throb Aggravating factors: movement of the R leg, standing and walking Relieving factors: pain medication  PRECAUTIONS: None  RED FLAGS: None   WEIGHT BEARING RESTRICTIONS: No  FALLS:  Has patient fallen in last 6 months? No  LIVING ENVIRONMENT: Lives with: lives with their family Lives in: House/apartment Stairs: Yes: External: 5 steps; can reach both Has following equipment at home: Single point cane, Walker - 4 wheeled, Marine Scientist  OCCUPATION: disability  PLOF: Independent with household mobility with device and Independent with community mobility with device  PATIENT GOALS: To improve ability  to walk   OBJECTIVE:  Note: Objective measures were completed at Evaluation unless otherwise noted.  PATIENT SURVEYS:  LEFS: 1/80= 1%  COGNITION: Overall cognitive status: Within functional limits for tasks assessed     SENSATION: WFL  EDEMA:  Swelling of the R hip  MUSCLE LENGTH: Hamstrings: Right NT deg; Left NT deg Debby test: Right NT deg; Left NT deg  POSTURE: rounded shoulders, forward head, and flexed trunk   PALPATION: TTP to the R peri-hip area  LOWER EXTREMITY ROM: Able to complete a  complete heel slide into flexion Not able to fully extend her R leg with min knee flexion Active ROM Right eval Left eval  Hip flexion    Hip extension    Hip abduction    Hip adduction    Hip internal rotation    Hip external rotation    Knee flexion    Knee extension    Ankle dorsiflexion    Ankle plantarflexion    Ankle inversion    Ankle eversion     (Blank rows = not tested)  LOWER EXTREMITY MMT: Not able to complete a SLR Able to complete a LAQ Needs belt assist to complete hip abd in supine MMT Right eval Left eval  Hip flexion 2   Hip extension 2   Hip abduction 2   Hip adduction    Hip internal rotation    Hip external rotation 2   Knee flexion 3   Knee extension 3   Ankle dorsiflexion    Ankle plantarflexion    Ankle inversion    Ankle eversion     (Blank rows = not tested)  FUNCTIONAL TESTS:  5 times sit to stand: TBA when pt is able to walk c a SPC 2 minute walk test: TBA when pt is able to walk with a SPC  GAIT: Distance walked: 33' Assistive device utilized: Environmental Consultant - 4 wheeled Level of assistance: SBA Comments: Antalgic pattern over R LE, decreased pace                                                                                                                  TREATMENT DATE:  [facility] Adult PT Treatment:                                                DATE: 07/19/24 Therapeutic Exercise/Activity: Supine heel slide 2x10 - 5 hold R Hooklying ball squeeze 2x10 - 5 hold Hooklying clamshell 2x10 GTB LAQ 2x10 3# bilat Supine Gluteal Sets x10 5 Supine Quad Set  x10 5 Standing at high table with UE STS 2x10 Heel raises 2x10 Lateral steps Standing hip abd 2x10 Self Care: Pt was encouraged to drink plenty of fluids to help minimize hamstring cramping  [facility] Adult PT Treatment:  DATE: 07/17/24 Supine heel slide 2x10 - 5 hold R Hooklying ball squeeze 2x10 - 5 hold Hooklying clamshell 2x10  GTB LAQ 2x10 2.5# L STS 2x10 - high table with UE  [facility] Adult PT Treatment:                                                DATE: 07/13/24 Therapeutic Exercise: Developed, instructed in, and pt completed therex as noted in HEP  Self Care: Use of cold pack every 2 hours as needed for 10-15 mins for pain and swelling management    PATIENT EDUCATION:  Education details: Eval findings, POC, HEP, self care Person educated: Patient and Spouse Education method: Explanation, Demonstration, Tactile cues, Verbal cues, and Handouts Education comprehension: verbalized understanding, returned demonstration, verbal cues required, and tactile cues required  HOME EXERCISE PROGRAM: Access Code: [email or web address] URL: [email or web address] Date: 07/17/2024 Prepared by: Alm Kingdom  Exercises - Seated Long Arc Quad  - 3 x daily - 7 x weekly - 1 sets - 5-10 reps - 3 hold - Supine Gluteal Sets  - 1 x daily - 7 x weekly - 1 sets - 5-10 reps - 5 hold - Supine Quad Set  - 3 x daily - 7 x weekly - 1 sets - 5-10 reps - 5 hold - Supine Heel Slide with Strap (Mirrored)  - 3 x daily - 7 x weekly - 1 sets - 5-10 reps - 5 hold - Supine Hip Abduction (Mirrored)  - 3 x daily - 7 x weekly - 1 sets - 5-10 reps - 3 hold - Hooklying Clamshell with Resistance  - 3 x daily - 7 x weekly - 3 sets - 10 reps - green band hold  ASSESSMENT:  CLINICAL IMPRESSION: PT was completed for R LE mobility, strength and functional mobility post R THA. Pt tolerated PT today without adverse effects. Pt is progressing with improved ease of mobility with the R LE and her quality of walking. Pt will continue to benefit from skilled PT to address impairments for improved R LE function.   EVAL: Patient is a 58 y.o. female who was seen today for physical therapy evaluation and treatment for R THA, posterior approach. Pt presents to PT with decreased R LE strength and function following a THA 2 days previously. Pt reports greater pain with  the R THA vs the L THA this summer. Pt was instructed in and completed a HEP to promote ROM and strength of the R LE. Self care for use of a cold pain to assist in the management pain and swelling was also provided. Pt will benefit from skilled PT 2w8 to address impairments to optimize R LE function with less pain.   OBJECTIVE IMPAIRMENTS: decreased activity tolerance, decreased balance, decreased mobility, difficulty walking, decreased ROM, decreased strength, increased edema, pain, and high BMI.   ACTIVITY LIMITATIONS: carrying, lifting, bending, sitting, standing, squatting, sleeping, stairs, transfers, bed mobility, bathing, toileting, dressing, hygiene/grooming, locomotion level, and caring for others  PARTICIPATION LIMITATIONS: meal prep, cleaning, laundry, driving, shopping, and community activity  PERSONAL FACTORS: Fitness, Past/current experiences, Time since onset of injury/illness/exacerbation, and 3+ comorbidities: High BMI, smokes, asthma. L THA  are also affecting patient's functional outcome.   REHAB POTENTIAL: Good  CLINICAL DECISION MAKING: Evolving/moderate complexity  EVALUATION COMPLEXITY: Moderate   GOALS:  SHORT TERM GOALS: Target date: 08/04/24 Pt  will be Ind in an initial HEP  Baseline: started Goal status: INITIAL  2.  Pt will demonstrate improved R LE strength, completing a SLR x10, as indication to progress gait to assist with a SPC  Baseline: Able to to complete a SLR Goal status: INITIAL  3.  Pt's ambulation tolerance will increase to 250' ft c mod ind c a rollator/SPC for improved mobility in home and in the community  Baseline: 75' c rollator SBA Goal status: INITIAL  LONG TERM GOALS: Target date: 09/16/23  Pt will be Ind in a final HEP to maintain achieved LOF  Baseline: started Goal status: INITIAL  2.  Pt will demonstrate R hip strength to 4/5 and knee strength to 4+/5 to progress to ambulation  Baseline: se flow sheet Goal status: INITIAL  3.   Improve 5xSTS by MCID of 5 and by MCID of 55ft as indication of improved functional mobility  Baseline:TBA when pt is able to walk Ind c a SPC Goal status: INITIAL  4.  Pt will be ind in ambulation up to 500' and with asc/dsc 12 steps c HR for community mobility  Baseline: rollator c SBA Goal status: INITIAL  5.  Pt's LEFS will improve to 40% or greater as indication of improved function  Baseline: 1% Goal status: INITIAL   PLAN:  PT FREQUENCY: 2x/week  PT DURATION: 8 weeks  PLANNED INTERVENTIONS: 97164- PT Re-evaluation, 97110-Therapeutic exercises, 97530- Therapeutic activity, 97112- Neuromuscular re-education, 97535- Self Care, 02859- Manual therapy, [id number]- Gait training, [phone]- Electrical stimulation (manual), Patient/Family education, Balance training, Stair training, Taping, Joint mobilization, Cryotherapy, and Moist heat  PLAN FOR NEXT SESSION: Assess response to HEP; progress therex as indicated; use of modalities, and manual therapy as indicated.  Amora Sheehy MS, PT 07/19/24 3:43 PM

## 2024-07-19 ENCOUNTER — Ambulatory Visit

## 2024-07-19 DIAGNOSIS — R262 Difficulty in walking, not elsewhere classified: Secondary | ICD-10-CM

## 2024-07-19 DIAGNOSIS — R600 Localized edema: Secondary | ICD-10-CM

## 2024-07-19 DIAGNOSIS — M25552 Pain in left hip: Secondary | ICD-10-CM | POA: Diagnosis not present

## 2024-07-19 DIAGNOSIS — M6281 Muscle weakness (generalized): Secondary | ICD-10-CM

## 2024-07-25 ENCOUNTER — Ambulatory Visit

## 2024-07-25 DIAGNOSIS — R262 Difficulty in walking, not elsewhere classified: Secondary | ICD-10-CM

## 2024-07-25 DIAGNOSIS — M25552 Pain in left hip: Secondary | ICD-10-CM | POA: Diagnosis not present

## 2024-07-25 DIAGNOSIS — M6281 Muscle weakness (generalized): Secondary | ICD-10-CM

## 2024-07-25 DIAGNOSIS — R600 Localized edema: Secondary | ICD-10-CM

## 2024-07-25 NOTE — Therapy (Signed)
 OUTPATIENT PHYSICAL THERAPY TREATMENT   Patient Name: Savannah Mcneil MRN: [id number] DOB:01/12/1966, 58 y.o., female Today's Date: 07/25/2024  END OF SESSION:  PT End of Session - 07/25/24 1316     Visit Number 4    Number of Visits 17    Date for Recertification  09/15/24    Authorization Type [place] MEDICAID HEALTHY BLUE    Authorization Time Period 03/24/24-06/22/22    Authorization - Visit Number 4    Authorization - Number of Visits 11    PT Start Time 1155    PT Stop Time 1230    PT Time Calculation (min) 35 min    Activity Tolerance Patient tolerated treatment well;Patient limited by pain    Behavior During Therapy WFL for tasks assessed/performed             Past Medical History:  Diagnosis Date   Arthritis    Asthma    Diverticulitis    GERD (gastroesophageal reflux disease)    Headache    Hx of mirgranes   Hypertension    Neuromuscular disorder (HCC)    carpal tunnel   Pneumonia    Past Surgical History:  Procedure Laterality Date   ABLATION     uterus   CARPAL TUNNEL RELEASE Right    COLECTOMY     COLONOSCOPY     DILATION AND CURETTAGE OF UTERUS     ESOPHAGOGASTRODUODENOSCOPY     ESOPHAGUS SURGERY     TOTAL HIP ARTHROPLASTY Left 03/21/2024   Procedure: ARTHROPLASTY, HIP, TOTAL,POSTERIOR APPROACH;  Surgeon: Josefina Chew, MD;  Location: [facility];  Service: Orthopedics;  Laterality: Left;   TOTAL HIP ARTHROPLASTY Right 07/11/2024   Procedure: ARTHROPLASTY, HIP, TOTAL,POSTERIOR APPROACH;  Surgeon: Josefina Chew, MD;  Location: [facility];  Service: Orthopedics;  Laterality: Right;   Patient Active Problem List   Diagnosis Date Noted   Osteoarthritis of left hip 09/23/2021   Primary osteoarthritis of both hips 07/17/2021   Patellofemoral pain syndrome of both knees 07/17/2021   H/O colectomy 12/12/2020   Arthritis 12/12/2020   Diverticulosis 12/12/2020   Herniated nucleus pulposus, L5-S1 02/04/2016   Asthma 11/02/2008    PCP: [facility]  REFERRING PROVIDER: Delores Army POUR, PA-C  REFERRING DIAG: [phone] (ICD-10-CM) - Presence of right artificial hip joint   THERAPY DIAG:  Pain in left hip  Muscle weakness (generalized)  Difficulty in walking, not elsewhere classified  Localized edema  Rationale for Evaluation and Treatment: Rehabilitation  ONSET DATE: 07/11/24  SUBJECTIVE:   SUBJECTIVE STATEMENT: Pt presents to PT with reports of continued R hip pain. Has been compliant with HEP.   PERTINENT HISTORY: High BMI, smokes, asthma. L THA 03/21/24, HOH  PAIN:  Are you having pain?  Yes: NPRS scale: 5/10 Pain location: R gluteal, lat hip, and thigh Pain description: Ache, throb Aggravating factors: movement of the R leg, standing and walking Relieving factors: pain medication  PRECAUTIONS: None  RED FLAGS: None   WEIGHT BEARING RESTRICTIONS: No  FALLS:  Has patient fallen in last 6 months? No  LIVING ENVIRONMENT: Lives with: lives with their family Lives in: House/apartment Stairs: Yes: External: 5 steps; can reach both Has following equipment at home: Single point cane, Walker - 4 wheeled, Marine Scientist  OCCUPATION: disability  PLOF: Independent with household mobility with device and Independent with community mobility with device  PATIENT GOALS: To improve ability to walk   OBJECTIVE:  Note: Objective measures were completed at Evaluation unless otherwise noted.  PATIENT SURVEYS:  LEFS: 1/80= 1%  COGNITION: Overall cognitive status: Within functional limits for tasks assessed     SENSATION: WFL  EDEMA:  Swelling of the R hip  MUSCLE LENGTH: Hamstrings: Right NT deg; Left NT deg Debby test: Right NT deg; Left NT deg  POSTURE: rounded shoulders, forward head, and flexed trunk   PALPATION: TTP to the R peri-hip area  LOWER EXTREMITY ROM: Able to complete a complete heel slide into flexion Not able to fully extend her R leg with min knee flexion Active ROM  Right eval Left eval  Hip flexion    Hip extension    Hip abduction    Hip adduction    Hip internal rotation    Hip external rotation    Knee flexion    Knee extension    Ankle dorsiflexion    Ankle plantarflexion    Ankle inversion    Ankle eversion     (Blank rows = not tested)  LOWER EXTREMITY MMT: Not able to complete a SLR Able to complete a LAQ Needs belt assist to complete hip abd in supine MMT Right eval Left eval  Hip flexion 2   Hip extension 2   Hip abduction 2   Hip adduction    Hip internal rotation    Hip external rotation 2   Knee flexion 3   Knee extension 3   Ankle dorsiflexion    Ankle plantarflexion    Ankle inversion    Ankle eversion     (Blank rows = not tested)  FUNCTIONAL TESTS:  5 times sit to stand: TBA when pt is able to walk c a SPC 2 minute walk test: TBA when pt is able to walk with a SPC  GAIT: Distance walked: 53' Assistive device utilized: Environmental Consultant - 4 wheeled Level of assistance: SBA Comments: Antalgic pattern over R LE, decreased pace                                                                                                                  TREATMENT DATE:  [facility] Adult PT Treatment:                                                DATE: 07/19/24 Therapeutic Exercise/Activity: NuStep lvl 5 UE/LE x 4 min for functional activity tolerance STS 2x10 7# DB Bridge 2x10 Hooklying clamshell 2x15 blue band LAQ 3x10 3# R Standing hip abd/ext 2x10 R Step ups 3x10 6in   [facility] Adult PT Treatment:                                                DATE: 07/17/24 Supine heel slide 2x10 - 5 hold R Hooklying ball squeeze 2x10 - 5 hold Hooklying  clamshell 2x10 GTB LAQ 2x10 2.5# L STS 2x10 - high table with UE  [facility] Adult PT Treatment:                                                DATE: 07/13/24 Therapeutic Exercise: Developed, instructed in, and pt completed therex as noted in HEP  Self Care: Use of cold pack every 2 hours as needed  for 10-15 mins for pain and swelling management    PATIENT EDUCATION:  Education details: Eval findings, POC, HEP, self care Person educated: Patient and Spouse Education method: Explanation, Demonstration, Tactile cues, Verbal cues, and Handouts Education comprehension: verbalized understanding, returned demonstration, verbal cues required, and tactile cues required  HOME EXERCISE PROGRAM: Access Code: [email or web address] URL: [email or web address] Date: 07/17/2024 Prepared by: Alm Kingdom  Exercises - Seated Long Arc Quad  - 3 x daily - 7 x weekly - 1 sets - 5-10 reps - 3 hold - Supine Gluteal Sets  - 1 x daily - 7 x weekly - 1 sets - 5-10 reps - 5 hold - Supine Quad Set  - 3 x daily - 7 x weekly - 1 sets - 5-10 reps - 5 hold - Supine Heel Slide with Strap (Mirrored)  - 3 x daily - 7 x weekly - 1 sets - 5-10 reps - 5 hold - Supine Hip Abduction (Mirrored)  - 3 x daily - 7 x weekly - 1 sets - 5-10 reps - 3 hold - Hooklying Clamshell with Resistance  - 3 x daily - 7 x weekly - 3 sets - 10 reps - green band hold  ASSESSMENT:  CLINICAL IMPRESSION: Pt was able to complete all prescribed exercises with no adverse effect. Today we worked on improving LE strength and functional mobility post R THA. Pt is progressing with therapy, showing improved mobility today. Will continue to progress as able per POC.   EVAL: Patient is a 58 y.o. female who was seen today for physical therapy evaluation and treatment for R THA, posterior approach. Pt presents to PT with decreased R LE strength and function following a THA 2 days previously. Pt reports greater pain with the R THA vs the L THA this summer. Pt was instructed in and completed a HEP to promote ROM and strength of the R LE. Self care for use of a cold pain to assist in the management pain and swelling was also provided. Pt will benefit from skilled PT 2w8 to address impairments to optimize R LE function with less pain.   OBJECTIVE  IMPAIRMENTS: decreased activity tolerance, decreased balance, decreased mobility, difficulty walking, decreased ROM, decreased strength, increased edema, pain, and high BMI.   ACTIVITY LIMITATIONS: carrying, lifting, bending, sitting, standing, squatting, sleeping, stairs, transfers, bed mobility, bathing, toileting, dressing, hygiene/grooming, locomotion level, and caring for others  PARTICIPATION LIMITATIONS: meal prep, cleaning, laundry, driving, shopping, and community activity  PERSONAL FACTORS: Fitness, Past/current experiences, Time since onset of injury/illness/exacerbation, and 3+ comorbidities: High BMI, smokes, asthma. L THA  are also affecting patient's functional outcome.   REHAB POTENTIAL: Good  CLINICAL DECISION MAKING: Evolving/moderate complexity  EVALUATION COMPLEXITY: Moderate   GOALS:  SHORT TERM GOALS: Target date: 08/04/24 Pt will be Ind in an initial HEP  Baseline: started Goal status: INITIAL  2.  Pt will demonstrate improved R LE strength, completing a SLR x10, as indication to  progress gait to assist with a SPC  Baseline: Able to to complete a SLR Goal status: INITIAL  3.  Pt's ambulation tolerance will increase to 250' ft c mod ind c a rollator/SPC for improved mobility in home and in the community  Baseline: 75' c rollator SBA Goal status: INITIAL  LONG TERM GOALS: Target date: 09/16/23  Pt will be Ind in a final HEP to maintain achieved LOF  Baseline: started Goal status: INITIAL  2.  Pt will demonstrate R hip strength to 4/5 and knee strength to 4+/5 to progress to ambulation  Baseline: se flow sheet Goal status: INITIAL  3.  Improve 5xSTS by MCID of 5 and by MCID of 108ft as indication of improved functional mobility  Baseline:TBA when pt is able to walk Ind c a SPC Goal status: INITIAL  4.  Pt will be ind in ambulation up to 500' and with asc/dsc 12 steps c HR for community mobility  Baseline: rollator c SBA Goal status: INITIAL  5.   Pt's LEFS will improve to 40% or greater as indication of improved function  Baseline: 1% Goal status: INITIAL   PLAN:  PT FREQUENCY: 2x/week  PT DURATION: 8 weeks  PLANNED INTERVENTIONS: 97164- PT Re-evaluation, 97110-Therapeutic exercises, 97530- Therapeutic activity, 97112- Neuromuscular re-education, 97535- Self Care, 02859- Manual therapy, [id number]- Gait training, [phone]- Electrical stimulation (manual), Patient/Family education, Balance training, Stair training, Taping, Joint mobilization, Cryotherapy, and Moist heat  PLAN FOR NEXT SESSION: Assess response to HEP; progress therex as indicated; use of modalities, and manual therapy as indicated.  Alm JAYSON Kingdom PT  07/25/24 1:32 PM

## 2024-08-01 ENCOUNTER — Ambulatory Visit: Attending: Orthopedic Surgery

## 2024-08-01 DIAGNOSIS — R262 Difficulty in walking, not elsewhere classified: Secondary | ICD-10-CM | POA: Diagnosis present

## 2024-08-01 DIAGNOSIS — R2681 Unsteadiness on feet: Secondary | ICD-10-CM | POA: Diagnosis present

## 2024-08-01 DIAGNOSIS — M6281 Muscle weakness (generalized): Secondary | ICD-10-CM | POA: Insufficient documentation

## 2024-08-01 DIAGNOSIS — R600 Localized edema: Secondary | ICD-10-CM | POA: Diagnosis present

## 2024-08-01 DIAGNOSIS — M25552 Pain in left hip: Secondary | ICD-10-CM | POA: Insufficient documentation

## 2024-08-01 NOTE — Therapy (Signed)
 OUTPATIENT PHYSICAL THERAPY TREATMENT   Patient Name: Savannah Mcneil MRN: [id number] DOB:December 13, 1965, 58 y.o., female Today's Date: 08/01/2024  END OF SESSION:  PT End of Session - 08/01/24 1010     Visit Number 5    Number of Visits 17    Date for Recertification  09/15/24    Authorization Type [place] MEDICAID HEALTHY BLUE    Authorization Time Period 03/24/24-06/22/22    Authorization - Visit Number 5    Authorization - Number of Visits 11    PT Start Time 1012    PT Stop Time 1052    PT Time Calculation (min) 40 min    Activity Tolerance Patient tolerated treatment well;Patient limited by pain    Behavior During Therapy WFL for tasks assessed/performed              Past Medical History:  Diagnosis Date   Arthritis    Asthma    Diverticulitis    GERD (gastroesophageal reflux disease)    Headache    Hx of mirgranes   Hypertension    Neuromuscular disorder (HCC)    carpal tunnel   Pneumonia    Past Surgical History:  Procedure Laterality Date   ABLATION     uterus   CARPAL TUNNEL RELEASE Right    COLECTOMY     COLONOSCOPY     DILATION AND CURETTAGE OF UTERUS     ESOPHAGOGASTRODUODENOSCOPY     ESOPHAGUS SURGERY     TOTAL HIP ARTHROPLASTY Left 03/21/2024   Procedure: ARTHROPLASTY, HIP, TOTAL,POSTERIOR APPROACH;  Surgeon: Josefina Chew, MD;  Location: [facility];  Service: Orthopedics;  Laterality: Left;   TOTAL HIP ARTHROPLASTY Right 07/11/2024   Procedure: ARTHROPLASTY, HIP, TOTAL,POSTERIOR APPROACH;  Surgeon: Josefina Chew, MD;  Location: [facility];  Service: Orthopedics;  Laterality: Right;   Patient Active Problem List   Diagnosis Date Noted   Osteoarthritis of left hip 09/23/2021   Primary osteoarthritis of both hips 07/17/2021   Patellofemoral pain syndrome of both knees 07/17/2021   H/O colectomy 12/12/2020   Arthritis 12/12/2020   Diverticulosis 12/12/2020   Herniated nucleus pulposus, L5-S1 02/04/2016   Asthma 11/02/2008    [facility]P: [facility]  REFERRING PROVIDER: Delores Army POUR, PA-C  REFERRING DIAG: [phone] (ICD-10-CM) - Presence of right artificial hip joint   THERAPY DIAG:  Pain in left hip  Muscle weakness (generalized)  Difficulty in walking, not elsewhere classified  Localized edema  Rationale for Evaluation and Treatment: Rehabilitation  ONSET DATE: 07/11/24  SUBJECTIVE:   SUBJECTIVE STATEMENT: Pt presents to PT with continued R hip pain. Has been doing well and had a good f/u with MD last week. Has been compliant with HEP.   PERTINENT HISTORY: High BMI, smokes, asthma. L THA 03/21/24, HOH  PAIN:  Are you having pain?  Yes: NPRS scale: 5/10 Pain location: R gluteal, lat hip, and thigh Pain description: Ache, throb Aggravating factors: movement of the R leg, standing and walking Relieving factors: pain medication  PRECAUTIONS: None  RED FLAGS: None   WEIGHT BEARING RESTRICTIONS: No  FALLS:  Has patient fallen in last 6 months? No  LIVING ENVIRONMENT: Lives with: lives with their family Lives in: House/apartment Stairs: Yes: External: 5 steps; can reach both Has following equipment at home: Single point cane, Walker - 4 wheeled, Marine Scientist  OCCUPATION: disability  PLOF: Independent with household mobility with device and Independent with community mobility with device  PATIENT GOALS: To improve ability to walk   OBJECTIVE:  Note: Objective measures were completed at Evaluation unless otherwise noted.  PATIENT SURVEYS:  LEFS: 1/80= 1%  COGNITION: Overall cognitive status: Within functional limits for tasks assessed     SENSATION: WFL  EDEMA:  Swelling of the R hip  MUSCLE LENGTH: Hamstrings: Right NT deg; Left NT deg Debby test: Right NT deg; Left NT deg  POSTURE: rounded shoulders, forward head, and flexed trunk   PALPATION: TTP to the R peri-hip area  LOWER EXTREMITY ROM: Able to complete a complete heel slide into flexion Not able to fully extend  her R leg with min knee flexion Active ROM Right eval Left eval  Hip flexion    Hip extension    Hip abduction    Hip adduction    Hip internal rotation    Hip external rotation    Knee flexion    Knee extension    Ankle dorsiflexion    Ankle plantarflexion    Ankle inversion    Ankle eversion     (Blank rows = not tested)  LOWER EXTREMITY MMT: Not able to complete a SLR Able to complete a LAQ Needs belt assist to complete hip abd in supine MMT Right eval Left eval  Hip flexion 2   Hip extension 2   Hip abduction 2   Hip adduction    Hip internal rotation    Hip external rotation 2   Knee flexion 3   Knee extension 3   Ankle dorsiflexion    Ankle plantarflexion    Ankle inversion    Ankle eversion     (Blank rows = not tested)  FUNCTIONAL TESTS:  5 times sit to stand: TBA when pt is able to walk c a SPC 2 minute walk test: TBA when pt is able to walk with a SPC  GAIT: Distance walked: 60' Assistive device utilized: Environmental Consultant - 4 wheeled Level of assistance: SBA Comments: Antalgic pattern over R LE, decreased pace                                                                                                                  TREATMENT DATE:  [facility] Adult PT Treatment:                                                DATE: 08/01/24 Therapeutic Exercise/Activity: NuStep lvl 6 UE/LE x 4 min for functional activity tolerance STS 2x10 8# DB Bridge 2x15 SLR x 5 R LAQ 3x10 5# R Standing hip abd/ext 2x10 each Standing mini squat 2x10 B UE support Step ups fwd 2x15 6in  Step ups lateral 2x10 R 6in Amb with SPC CGA x 273ft   [facility] Adult PT Treatment:  DATE: 07/19/24 Therapeutic Exercise/Activity: NuStep lvl 5 UE/LE x 4 min for functional activity tolerance STS 2x10 7# DB Bridge 2x10 Hooklying clamshell 2x15 blue band LAQ 3x10 3# R Standing hip abd/ext 2x10 R Step ups 3x10 6in   [facility] Adult PT Treatment:                                                 DATE: 07/17/24 Supine heel slide 2x10 - 5 hold R Hooklying ball squeeze 2x10 - 5 hold Hooklying clamshell 2x10 GTB LAQ 2x10 2.5# L STS 2x10 - high table with UE  [facility] Adult PT Treatment:                                                DATE: 07/13/24 Therapeutic Exercise: Developed, instructed in, and pt completed therex as noted in HEP  Self Care: Use of cold pack every 2 hours as needed for 10-15 mins for pain and swelling management    PATIENT EDUCATION:  Education details: Eval findings, POC, HEP, self care Person educated: Patient and Spouse Education method: Explanation, Demonstration, Tactile cues, Verbal cues, and Handouts Education comprehension: verbalized understanding, returned demonstration, verbal cues required, and tactile cues required  HOME EXERCISE PROGRAM: Access Code: [email or web address] URL: [email or web address] Date: 07/17/2024 Prepared by: Alm Kingdom  Exercises - Seated Long Arc Quad  - 3 x daily - 7 x weekly - 1 sets - 5-10 reps - 3 hold - Supine Gluteal Sets  - 1 x daily - 7 x weekly - 1 sets - 5-10 reps - 5 hold - Supine Quad Set  - 3 x daily - 7 x weekly - 1 sets - 5-10 reps - 5 hold - Supine Heel Slide with Strap (Mirrored)  - 3 x daily - 7 x weekly - 1 sets - 5-10 reps - 5 hold - Supine Hip Abduction (Mirrored)  - 3 x daily - 7 x weekly - 1 sets - 5-10 reps - 3 hold - Hooklying Clamshell with Resistance  - 3 x daily - 7 x weekly - 3 sets - 10 reps - green band hold  ASSESSMENT:  CLINICAL IMPRESSION: Pt was able to complete all prescribed exercises with no adverse effect. Today we worked on improving LE strength and functional mobility post R THA. She progressed better today and was able to tolerate gait with SPC and perform a SLR today. Pt is progressing with therapy,will continue to progress as able per POC.   EVAL: Patient is a 58 y.o. female who was seen today for physical therapy evaluation and treatment for R  THA, posterior approach. Pt presents to PT with decreased R LE strength and function following a THA 2 days previously. Pt reports greater pain with the R THA vs the L THA this summer. Pt was instructed in and completed a HEP to promote ROM and strength of the R LE. Self care for use of a cold pain to assist in the management pain and swelling was also provided. Pt will benefit from skilled PT 2w8 to address impairments to optimize R LE function with less pain.   OBJECTIVE IMPAIRMENTS: decreased activity tolerance, decreased balance, decreased mobility, difficulty walking, decreased ROM, decreased strength, increased edema, pain, and  high BMI.   ACTIVITY LIMITATIONS: carrying, lifting, bending, sitting, standing, squatting, sleeping, stairs, transfers, bed mobility, bathing, toileting, dressing, hygiene/grooming, locomotion level, and caring for others  PARTICIPATION LIMITATIONS: meal prep, cleaning, laundry, driving, shopping, and community activity  PERSONAL FACTORS: Fitness, Past/current experiences, Time since onset of injury/illness/exacerbation, and 3+ comorbidities: High BMI, smokes, asthma. L THA  are also affecting patient's functional outcome.   REHAB POTENTIAL: Good  CLINICAL DECISION MAKING: Evolving/moderate complexity  EVALUATION COMPLEXITY: Moderate   GOALS:  SHORT TERM GOALS: Target date: 08/04/24 Pt will be Ind in an initial HEP  Baseline: started Goal status: INITIAL  2.  Pt will demonstrate improved R LE strength, completing a SLR x10, as indication to progress gait to assist with a SPC  Baseline: Able to to complete a SLR Goal status: INITIAL  3.  Pt's ambulation tolerance will increase to 250' ft c mod ind c a rollator/SPC for improved mobility in home and in the community  Baseline: 75' c rollator SBA Goal status: INITIAL  LONG TERM GOALS: Target date: 09/16/23  Pt will be Ind in a final HEP to maintain achieved LOF  Baseline: started Goal status: INITIAL  2.   Pt will demonstrate R hip strength to 4/5 and knee strength to 4+/5 to progress to ambulation  Baseline: se flow sheet Goal status: INITIAL  3.  Improve 5xSTS by MCID of 5 and by MCID of 40ft as indication of improved functional mobility  Baseline:TBA when pt is able to walk Ind c a SPC Goal status: INITIAL  4.  Pt will be ind in ambulation up to 500' and with asc/dsc 12 steps c HR for community mobility  Baseline: rollator c SBA Goal status: INITIAL  5.  Pt's LEFS will improve to 40% or greater as indication of improved function  Baseline: 1% Goal status: INITIAL   PLAN:  PT FREQUENCY: 2x/week  PT DURATION: 8 weeks  PLANNED INTERVENTIONS: 97164- PT Re-evaluation, 97110-Therapeutic exercises, 97530- Therapeutic activity, 97112- Neuromuscular re-education, 97535- Self Care, 02859- Manual therapy, [id number]- Gait training, [phone]- Electrical stimulation (manual), Patient/Family education, Balance training, Stair training, Taping, Joint mobilization, Cryotherapy, and Moist heat  PLAN FOR NEXT SESSION: Assess response to HEP; progress therex as indicated; use of modalities, and manual therapy as indicated.  Alm JAYSON Kingdom PT  08/01/24 11:52 AM

## 2024-08-03 ENCOUNTER — Ambulatory Visit

## 2024-08-03 DIAGNOSIS — R262 Difficulty in walking, not elsewhere classified: Secondary | ICD-10-CM

## 2024-08-03 DIAGNOSIS — M6281 Muscle weakness (generalized): Secondary | ICD-10-CM

## 2024-08-03 DIAGNOSIS — M25552 Pain in left hip: Secondary | ICD-10-CM | POA: Diagnosis not present

## 2024-08-03 NOTE — Therapy (Signed)
 OUTPATIENT PHYSICAL THERAPY TREATMENT   Patient Name: Savannah Mcneil MRN: [id number] DOB:11-27-1965, 58 y.o., female Today's Date: 08/03/2024  END OF SESSION:  PT End of Session - 08/03/24 1310     Visit Number 6    Number of Visits 17    Date for Recertification  09/15/24    Authorization Type [place] MEDICAID HEALTHY BLUE    Authorization Time Period 03/24/24-06/22/22    Authorization - Number of Visits 11    PT Start Time 1315    PT Stop Time 1355    PT Time Calculation (min) 40 min    Activity Tolerance Patient tolerated treatment well;Patient limited by pain    Behavior During Therapy WFL for tasks assessed/performed               Past Medical History:  Diagnosis Date   Arthritis    Asthma    Diverticulitis    GERD (gastroesophageal reflux disease)    Headache    Hx of mirgranes   Hypertension    Neuromuscular disorder (HCC)    carpal tunnel   Pneumonia    Past Surgical History:  Procedure Laterality Date   ABLATION     uterus   CARPAL TUNNEL RELEASE Right    COLECTOMY     COLONOSCOPY     DILATION AND CURETTAGE OF UTERUS     ESOPHAGOGASTRODUODENOSCOPY     ESOPHAGUS SURGERY     TOTAL HIP ARTHROPLASTY Left 03/21/2024   Procedure: ARTHROPLASTY, HIP, TOTAL,POSTERIOR APPROACH;  Surgeon: Josefina Chew, MD;  Location: [facility];  Service: Orthopedics;  Laterality: Left;   TOTAL HIP ARTHROPLASTY Right 07/11/2024   Procedure: ARTHROPLASTY, HIP, TOTAL,POSTERIOR APPROACH;  Surgeon: Josefina Chew, MD;  Location: [facility];  Service: Orthopedics;  Laterality: Right;   Patient Active Problem List   Diagnosis Date Noted   Osteoarthritis of left hip 09/23/2021   Primary osteoarthritis of both hips 07/17/2021   Patellofemoral pain syndrome of both knees 07/17/2021   H/O colectomy 12/12/2020   Arthritis 12/12/2020   Diverticulosis 12/12/2020   Herniated nucleus pulposus, L5-S1 02/04/2016   Asthma 11/02/2008    PCP: [facility]  REFERRING PROVIDER:  Delores Army POUR, PA-C  REFERRING DIAG: [id number] (ICD-10-CM) - Presence of right artificial hip joint   THERAPY DIAG:  Muscle weakness (generalized)  Difficulty in walking, not elsewhere classified  Rationale for Evaluation and Treatment: Rehabilitation  ONSET DATE: 07/11/24  SUBJECTIVE:   SUBJECTIVE STATEMENT: Pt presents to PT with no pain. Has been compliant with HEP.   PERTINENT HISTORY: High BMI, smokes, asthma. L THA 03/21/24, HOH  PAIN:  Are you having pain?  Yes: NPRS scale: 0/10 Pain location: R gluteal, lat hip, and thigh Pain description: Ache, throb Aggravating factors: movement of the R leg, standing and walking Relieving factors: pain medication  PRECAUTIONS: None  RED FLAGS: None   WEIGHT BEARING RESTRICTIONS: No  FALLS:  Has patient fallen in last 6 months? No  LIVING ENVIRONMENT: Lives with: lives with their family Lives in: House/apartment Stairs: Yes: External: 5 steps; can reach both Has following equipment at home: Single point cane, Walker - 4 wheeled, Marine Scientist  OCCUPATION: disability  PLOF: Independent with household mobility with device and Independent with community mobility with device  PATIENT GOALS: To improve ability to walk   OBJECTIVE:  Note: Objective measures were completed at Evaluation unless otherwise noted.  PATIENT SURVEYS:  LEFS: 1/80= 1%  COGNITION: Overall cognitive status: Within functional limits for tasks assessed  SENSATION: WFL  EDEMA:  Swelling of the R hip  MUSCLE LENGTH: Hamstrings: Right NT deg; Left NT deg Debby test: Right NT deg; Left NT deg  POSTURE: rounded shoulders, forward head, and flexed trunk   PALPATION: TTP to the R peri-hip area  LOWER EXTREMITY ROM: Able to complete a complete heel slide into flexion Not able to fully extend her R leg with min knee flexion Active ROM Right eval Left eval  Hip flexion    Hip extension    Hip abduction    Hip adduction    Hip  internal rotation    Hip external rotation    Knee flexion    Knee extension    Ankle dorsiflexion    Ankle plantarflexion    Ankle inversion    Ankle eversion     (Blank rows = not tested)  LOWER EXTREMITY MMT: Not able to complete a SLR Able to complete a LAQ Needs belt assist to complete hip abd in supine MMT Right eval Left eval  Hip flexion 2   Hip extension 2   Hip abduction 2   Hip adduction    Hip internal rotation    Hip external rotation 2   Knee flexion 3   Knee extension 3   Ankle dorsiflexion    Ankle plantarflexion    Ankle inversion    Ankle eversion     (Blank rows = not tested)  FUNCTIONAL TESTS:  5 times sit to stand: TBA when pt is able to walk c a SPC 2 minute walk test: TBA when pt is able to walk with a SPC  GAIT: Distance walked: 37' Assistive device utilized: Environmental Consultant - 4 wheeled Level of assistance: SBA Comments: Antalgic pattern over R LE, decreased pace                                                                                                                  TREATMENT DATE:  08/03/24 Therapeutic Exercise/Activity: HEP reassessment and update NuStep lvl 6 UE/LE x 5 min for functional activity tolerance STS 3x5 10# DB Clamshell black TB 2x10 LAQ 3x12 Green TB Marches GTB 2x5 BL Standing marches x12 (S UE support) Seated HS curl BL 2x10   Did not do:  SLR Standing hip abd/ext 2x10 each Standing mini squat 2x10 B UE support   [facility] Adult PT Treatment:                                                DATE: 08/01/24 Therapeutic Exercise/Activity: NuStep lvl 6 UE/LE x 4 min for functional activity tolerance STS 2x10 8# DB Bridge 2x15 SLR x 5 R LAQ 3x10 5# R Standing hip abd/ext 2x10 each Standing mini squat 2x10 B UE support Step ups fwd 2x15 6in  Step ups lateral 2x10 R 6in Amb with SPC CGA x 240ft   [facility] Adult PT Treatment:  DATE: 07/19/24 Therapeutic Exercise/Activity: NuStep  lvl 5 UE/LE x 4 min for functional activity tolerance STS 2x10 7# DB Bridge 2x10 Hooklying clamshell 2x15 blue band LAQ 3x10 3# R Standing hip abd/ext 2x10 R Step ups 3x10 6in   [facility] Adult PT Treatment:                                                DATE: 07/17/24 Supine heel slide 2x10 - 5 hold R Hooklying ball squeeze 2x10 - 5 hold Hooklying clamshell 2x10 GTB LAQ 2x10 2.5# L STS 2x10 - high table with UE  [facility] Adult PT Treatment:                                                DATE: 07/13/24 Therapeutic Exercise: Developed, instructed in, and pt completed therex as noted in HEP  Self Care: Use of cold pack every 2 hours as needed for 10-15 mins for pain and swelling management    PATIENT EDUCATION:  Education details: Eval findings, POC, HEP, self care Person educated: Patient and Spouse Education method: Explanation, Demonstration, Tactile cues, Verbal cues, and Handouts Education comprehension: verbalized understanding, returned demonstration, verbal cues required, and tactile cues required  HOME EXERCISE PROGRAM: Access Code: [email or web address] URL: [email or web address] Date: 07/17/2024 Prepared by: Alm Kingdom  Exercises - Seated Long Arc Quad  - 3 x daily - 7 x weekly - 1 sets - 5-10 reps - 3 hold - Supine Gluteal Sets  - 1 x daily - 7 x weekly - 1 sets - 5-10 reps - 5 hold - Supine Quad Set  - 3 x daily - 7 x weekly - 1 sets - 5-10 reps - 5 hold - Supine Heel Slide with Strap (Mirrored)  - 3 x daily - 7 x weekly - 1 sets - 5-10 reps - 5 hold - Supine Hip Abduction (Mirrored)  - 3 x daily - 7 x weekly - 1 sets - 5-10 reps - 3 hold - Hooklying Clamshell with Resistance  - 3 x daily - 7 x weekly - 3 sets - 10 reps - green band hold  ASSESSMENT:  CLINICAL IMPRESSION: Pt was able to complete all prescribed exercises with no adverse effect. Today we worked on improving LE strength and functional strength post R THA. Progressed with RLE isolated and functional  loading. Patient would continue to benefit from skilled PT services.  EVAL: Patient is a 58 y.o. female who was seen today for physical therapy evaluation and treatment for R THA, posterior approach. Pt presents to PT with decreased R LE strength and function following a THA 2 days previously. Pt reports greater pain with the R THA vs the L THA this summer. Pt was instructed in and completed a HEP to promote ROM and strength of the R LE. Self care for use of a cold pain to assist in the management pain and swelling was also provided. Pt will benefit from skilled PT 2w8 to address impairments to optimize R LE function with less pain.   OBJECTIVE IMPAIRMENTS: decreased activity tolerance, decreased balance, decreased mobility, difficulty walking, decreased ROM, decreased strength, increased edema, pain, and high BMI.   ACTIVITY LIMITATIONS: carrying, lifting, bending, sitting, standing, squatting, sleeping, stairs,  transfers, bed mobility, bathing, toileting, dressing, hygiene/grooming, locomotion level, and caring for others  PARTICIPATION LIMITATIONS: meal prep, cleaning, laundry, driving, shopping, and community activity  PERSONAL FACTORS: Fitness, Past/current experiences, Time since onset of injury/illness/exacerbation, and 3+ comorbidities: High BMI, smokes, asthma. L THA  are also affecting patient's functional outcome.   REHAB POTENTIAL: Good  CLINICAL DECISION MAKING: Evolving/moderate complexity  EVALUATION COMPLEXITY: Moderate   GOALS:  SHORT TERM GOALS: Target date: 08/04/24 Pt will be Ind in an initial HEP  Baseline: started Goal status: INITIAL  2.  Pt will demonstrate improved R LE strength, completing a SLR x10, as indication to progress gait to assist with a SPC  Baseline: Able to to complete a SLR Goal status: INITIAL  3.  Pt's ambulation tolerance will increase to 250' ft c mod ind c a rollator/SPC for improved mobility in home and in the community  Baseline: 75' c  rollator SBA Goal status: INITIAL  LONG TERM GOALS: Target date: 09/16/23  Pt will be Ind in a final HEP to maintain achieved LOF  Baseline: started Goal status: INITIAL  2.  Pt will demonstrate R hip strength to 4/5 and knee strength to 4+/5 to progress to ambulation  Baseline: se flow sheet Goal status: INITIAL  3.  Improve 5xSTS by MCID of 5 and by MCID of 59ft as indication of improved functional mobility  Baseline:TBA when pt is able to walk Ind c a SPC Goal status: INITIAL  4.  Pt will be ind in ambulation up to 500' and with asc/dsc 12 steps c HR for community mobility  Baseline: rollator c SBA Goal status: INITIAL  5.  Pt's LEFS will improve to 40% or greater as indication of improved function  Baseline: 1% Goal status: INITIAL   PLAN:  PT FREQUENCY: 2x/week  PT DURATION: 8 weeks  PLANNED INTERVENTIONS: 97164- PT Re-evaluation, 97110-Therapeutic exercises, 97530- Therapeutic activity, 97112- Neuromuscular re-education, 97535- Self Care, [id number]- Manual therapy, [id number]- Gait training, [phone]- Electrical stimulation (manual), Patient/Family education, Balance training, Stair training, Taping, Joint mobilization, Cryotherapy, and Moist heat  PLAN FOR NEXT SESSION: Assess response to HEP; progress therex as indicated; use of modalities, and manual therapy as indicated.  [place] Greener Rexine Gowens PT  08/03/24 10:46 PM

## 2024-08-08 ENCOUNTER — Ambulatory Visit

## 2024-08-08 DIAGNOSIS — M25552 Pain in left hip: Secondary | ICD-10-CM | POA: Diagnosis not present

## 2024-08-08 DIAGNOSIS — R2681 Unsteadiness on feet: Secondary | ICD-10-CM

## 2024-08-08 DIAGNOSIS — M6281 Muscle weakness (generalized): Secondary | ICD-10-CM

## 2024-08-08 DIAGNOSIS — R262 Difficulty in walking, not elsewhere classified: Secondary | ICD-10-CM

## 2024-08-08 NOTE — Therapy (Signed)
 OUTPATIENT PHYSICAL THERAPY TREATMENT   Patient Name: Savannah Mcneil MRN: [id number] DOB:February 14, 1966, 58 y.o., female Today's Date: 08/08/2024  END OF SESSION:  PT End of Session - 08/08/24 1407     Visit Number 7    Number of Visits 17    Date for Recertification  09/15/24    Authorization Type [place] MEDICAID HEALTHY BLUE    Authorization Time Period 03/24/24-06/22/22    Authorization - Number of Visits 11    PT Start Time 1400    PT Stop Time 1440    PT Time Calculation (min) 40 min    Activity Tolerance Patient tolerated treatment well;Patient limited by pain    Behavior During Therapy WFL for tasks assessed/performed                Past Medical History:  Diagnosis Date   Arthritis    Asthma    Diverticulitis    GERD (gastroesophageal reflux disease)    Headache    Hx of mirgranes   Hypertension    Neuromuscular disorder (HCC)    carpal tunnel   Pneumonia    Past Surgical History:  Procedure Laterality Date   ABLATION     uterus   CARPAL TUNNEL RELEASE Right    COLECTOMY     COLONOSCOPY     DILATION AND CURETTAGE OF UTERUS     ESOPHAGOGASTRODUODENOSCOPY     ESOPHAGUS SURGERY     TOTAL HIP ARTHROPLASTY Left 03/21/2024   Procedure: ARTHROPLASTY, HIP, TOTAL,POSTERIOR APPROACH;  Surgeon: Josefina Chew, MD;  Location: [facility];  Service: Orthopedics;  Laterality: Left;   TOTAL HIP ARTHROPLASTY Right 07/11/2024   Procedure: ARTHROPLASTY, HIP, TOTAL,POSTERIOR APPROACH;  Surgeon: Josefina Chew, MD;  Location: [facility];  Service: Orthopedics;  Laterality: Right;   Patient Active Problem List   Diagnosis Date Noted   Osteoarthritis of left hip 09/23/2021   Primary osteoarthritis of both hips 07/17/2021   Patellofemoral pain syndrome of both knees 07/17/2021   H/O colectomy 12/12/2020   Arthritis 12/12/2020   Diverticulosis 12/12/2020   Herniated nucleus pulposus, L5-S1 02/04/2016   Asthma 11/02/2008    PCP: [facility]  REFERRING PROVIDER:  Delores Army POUR, PA-C  REFERRING DIAG: [phone] (ICD-10-CM) - Presence of right artificial hip joint   THERAPY DIAG:  Difficulty in walking, not elsewhere classified  Muscle weakness (generalized)  Pain in left hip  Unsteadiness on feet  Rationale for Evaluation and Treatment: Rehabilitation  ONSET DATE: 07/11/24  SUBJECTIVE:   SUBJECTIVE STATEMENT: Pt presents to PT with some pain 2/10. Has been 25% compliant with HEP. Was very sore after last session.   PERTINENT HISTORY: High BMI, smokes, asthma. L THA 03/21/24, HOH  PAIN:  Are you having pain?  Yes: NPRS scale: 0/10 Pain location: R gluteal, lat hip, and thigh Pain description: Ache, throb Aggravating factors: movement of the R leg, standing and walking Relieving factors: pain medication  PRECAUTIONS: None  RED FLAGS: None   WEIGHT BEARING RESTRICTIONS: No  FALLS:  Has patient fallen in last 6 months? No  LIVING ENVIRONMENT: Lives with: lives with their family Lives in: House/apartment Stairs: Yes: External: 5 steps; can reach both Has following equipment at home: Single point cane, Walker - 4 wheeled, Marine Scientist  OCCUPATION: disability  PLOF: Independent with household mobility with device and Independent with community mobility with device  PATIENT GOALS: To improve ability to walk   OBJECTIVE:  Note: Objective measures were completed at Evaluation unless otherwise noted.  PATIENT SURVEYS:  LEFS: 1/80= 1%  COGNITION: Overall cognitive status: Within functional limits for tasks assessed     SENSATION: WFL  EDEMA:  Swelling of the R hip  MUSCLE LENGTH: Hamstrings: Right NT deg; Left NT deg Debby test: Right NT deg; Left NT deg  POSTURE: rounded shoulders, forward head, and flexed trunk   PALPATION: TTP to the R peri-hip area  LOWER EXTREMITY ROM: Able to complete a complete heel slide into flexion Not able to fully extend her R leg with min knee flexion Active ROM Right eval  Left eval  Hip flexion    Hip extension    Hip abduction    Hip adduction    Hip internal rotation    Hip external rotation    Knee flexion    Knee extension    Ankle dorsiflexion    Ankle plantarflexion    Ankle inversion    Ankle eversion     (Blank rows = not tested)  LOWER EXTREMITY MMT: Not able to complete a SLR Able to complete a LAQ Needs belt assist to complete hip abd in supine MMT Right eval Left eval  Hip flexion 2   Hip extension 2   Hip abduction 2   Hip adduction    Hip internal rotation    Hip external rotation 2   Knee flexion 3   Knee extension 3   Ankle dorsiflexion    Ankle plantarflexion    Ankle inversion    Ankle eversion     (Blank rows = not tested)  FUNCTIONAL TESTS:  5 times sit to stand: TBA when pt is able to walk c a SPC 2 minute walk test: TBA when pt is able to walk with a SPC  GAIT: Distance walked: 57' Assistive device utilized: Environmental Consultant - 4 wheeled Level of assistance: SBA Comments: Antalgic pattern over R LE, decreased pace                                                                                                                  TREATMENT DATE:  08/08/24 Therapeutic Exercise/Activity: HEP reassessment and update LAQ GTB x8 BL, Blue TB x6x3s, x8x3s Black clamshell x4 (DC!), green TB clamshell 2x8x3s Green TB march 2x6x3s  Green TB HS curl x8x3s, Blue TB HS curl x6x3s 8 inch step up x8 (S UE) x8 (no UE) STS x5 (lowest table setting)    08/03/24 Therapeutic Exercise/Activity: HEP reassessment and update LAQ GTB x8 BL, Blue TB x6x3s, x8x3s Black clamshell x4 (DC!), green TB clamshell 2x8x3s Green TB march 2x6x3s   NuStep lvl 6 UE/LE x 5 min for functional activity tolerance STS 3x5 10# DB Clamshell black TB 2x10 LAQ 3x12 Green TB Marches GTB 2x5 BL Standing marches x12 (S UE support) Seated HS curl BL 2x10   Did not do:  SLR Standing hip abd/ext 2x10 each Standing mini squat 2x10 B UE support   [facility]  Adult PT Treatment:  DATE: 08/01/24 Therapeutic Exercise/Activity: NuStep lvl 6 UE/LE x 4 min for functional activity tolerance STS 2x10 8# DB Bridge 2x15 SLR x 5 R LAQ 3x10 5# R Standing hip abd/ext 2x10 each Standing mini squat 2x10 B UE support Step ups fwd 2x15 6in  Step ups lateral 2x10 R 6in Amb with SPC CGA x 256ft   [facility] Adult PT Treatment:                                                DATE: 07/19/24 Therapeutic Exercise/Activity: NuStep lvl 5 UE/LE x 4 min for functional activity tolerance STS 2x10 7# DB Bridge 2x10 Hooklying clamshell 2x15 blue band LAQ 3x10 3# R Standing hip abd/ext 2x10 R Step ups 3x10 6in   [facility] Adult PT Treatment:                                                DATE: 07/17/24 Supine heel slide 2x10 - 5 hold R Hooklying ball squeeze 2x10 - 5 hold Hooklying clamshell 2x10 GTB LAQ 2x10 2.5# L STS 2x10 - high table with UE  [facility] Adult PT Treatment:                                                DATE: 07/13/24 Therapeutic Exercise: Developed, instructed in, and pt completed therex as noted in HEP  Self Care: Use of cold pack every 2 hours as needed for 10-15 mins for pain and swelling management    PATIENT EDUCATION:  Education details: Eval findings, POC, HEP, self care Person educated: Patient and Spouse Education method: Explanation, Demonstration, Tactile cues, Verbal cues, and Handouts Education comprehension: verbalized understanding, returned demonstration, verbal cues required, and tactile cues required  HOME EXERCISE PROGRAM: Access Code: [email or web address] URL: [email or web address] Date: 07/17/2024 Prepared by: Alm Kingdom  Exercises - Seated Long Arc Quad  - 3 x daily - 7 x weekly - 1 sets - 5-10 reps - 3 hold - Supine Gluteal Sets  - 1 x daily - 7 x weekly - 1 sets - 5-10 reps - 5 hold - Supine Quad Set  - 3 x daily - 7 x weekly - 1 sets - 5-10 reps - 5 hold - Supine Heel  Slide with Strap (Mirrored)  - 3 x daily - 7 x weekly - 1 sets - 5-10 reps - 5 hold - Supine Hip Abduction (Mirrored)  - 3 x daily - 7 x weekly - 1 sets - 5-10 reps - 3 hold - Hooklying Clamshell with Resistance  - 3 x daily - 7 x weekly - 3 sets - 10 reps - green band hold  ASSESSMENT:  CLINICAL IMPRESSION: Pt was able to complete all prescribed exercises with no adverse effect. Today we worked on improving LE strength and functional strength post R THA. Progressed with RLE isolated and functional loading. Patient would continue to benefit from skilled PT services.  EVAL: Patient is a 58 y.o. female who was seen today for physical therapy evaluation and treatment for R THA, posterior approach. Pt presents to PT with decreased R LE strength  and function following a THA 2 days previously. Pt reports greater pain with the R THA vs the L THA this summer. Pt was instructed in and completed a HEP to promote ROM and strength of the R LE. Self care for use of a cold pain to assist in the management pain and swelling was also provided. Pt will benefit from skilled PT 2w8 to address impairments to optimize R LE function with less pain.   OBJECTIVE IMPAIRMENTS: decreased activity tolerance, decreased balance, decreased mobility, difficulty walking, decreased ROM, decreased strength, increased edema, pain, and high BMI.   ACTIVITY LIMITATIONS: carrying, lifting, bending, sitting, standing, squatting, sleeping, stairs, transfers, bed mobility, bathing, toileting, dressing, hygiene/grooming, locomotion level, and caring for others  PARTICIPATION LIMITATIONS: meal prep, cleaning, laundry, driving, shopping, and community activity  PERSONAL FACTORS: Fitness, Past/current experiences, Time since onset of injury/illness/exacerbation, and 3+ comorbidities: High BMI, smokes, asthma. L THA  are also affecting patient's functional outcome.   REHAB POTENTIAL: Good  CLINICAL DECISION MAKING: Evolving/moderate  complexity  EVALUATION COMPLEXITY: Moderate   GOALS:  SHORT TERM GOALS: Target date: 08/04/24 Pt will be Ind in an initial HEP  Baseline: started Goal status: INITIAL  2.  Pt will demonstrate improved R LE strength, completing a SLR x10, as indication to progress gait to assist with a SPC  Baseline: Able to to complete a SLR Goal status: INITIAL  3.  Pt's ambulation tolerance will increase to 250' ft c mod ind c a rollator/SPC for improved mobility in home and in the community  Baseline: 75' c rollator SBA Goal status: INITIAL  LONG TERM GOALS: Target date: 09/16/23  Pt will be Ind in a final HEP to maintain achieved LOF  Baseline: started Goal status: INITIAL  2.  Pt will demonstrate R hip strength to 4/5 and knee strength to 4+/5 to progress to ambulation  Baseline: se flow sheet Goal status: INITIAL  3.  Improve 5xSTS by MCID of 5 and by MCID of 21ft as indication of improved functional mobility  Baseline:TBA when pt is able to walk Ind c a SPC Goal status: INITIAL  4.  Pt will be ind in ambulation up to 500' and with asc/dsc 12 steps c HR for community mobility  Baseline: rollator c SBA Goal status: INITIAL  5.  Pt's LEFS will improve to 40% or greater as indication of improved function  Baseline: 1% Goal status: INITIAL   PLAN:  PT FREQUENCY: 2x/week  PT DURATION: 8 weeks  PLANNED INTERVENTIONS: 97164- PT Re-evaluation, 97110-Therapeutic exercises, 97530- Therapeutic activity, 97112- Neuromuscular re-education, 97535- Self Care, 02859- Manual therapy, [id number]- Gait training, [phone]- Electrical stimulation (manual), Patient/Family education, Balance training, Stair training, Taping, Joint mobilization, Cryotherapy, and Moist heat  PLAN FOR NEXT SESSION: Assess response to HEP; progress therex as indicated; use of modalities, and manual therapy as indicated.  [place] Greener Xiong Haidar PT  08/08/24 2:42 PM

## 2024-08-15 NOTE — Therapy (Signed)
 OUTPATIENT PHYSICAL THERAPY TREATMENT   Patient Name: Savannah Mcneil MRN: [id number] DOB:10-03-1965, 58 y.o., female Today's Date: 08/17/2024  END OF SESSION:  PT End of Session - 08/17/24 1140     Visit Number 8    Number of Visits 17    Date for Recertification  09/15/24    Authorization Type  MEDICAID HEALTHY BLUE    Authorization Time Period 03/24/24-06/22/22    Authorization - Visit Number 6    Authorization - Number of Visits 11    PT Start Time 1145    PT Stop Time 1230    PT Time Calculation (min) 45 min    Activity Tolerance Patient tolerated treatment well;Patient limited by pain    Behavior During Therapy WFL for tasks assessed/performed                 Past Medical History:  Diagnosis Date   Arthritis    Asthma    Diverticulitis    GERD (gastroesophageal reflux disease)    Headache    Hx of mirgranes   Hypertension    Neuromuscular disorder (HCC)    carpal tunnel   Pneumonia    Past Surgical History:  Procedure Laterality Date   ABLATION     uterus   CARPAL TUNNEL RELEASE Right    COLECTOMY     COLONOSCOPY     DILATION AND CURETTAGE OF UTERUS     ESOPHAGOGASTRODUODENOSCOPY     ESOPHAGUS SURGERY     TOTAL HIP ARTHROPLASTY Left 03/21/2024   Procedure: ARTHROPLASTY, HIP, TOTAL,POSTERIOR APPROACH;  Surgeon: Josefina Chew, MD;  Location: [facility];  Service: Orthopedics;  Laterality: Left;   TOTAL HIP ARTHROPLASTY Right 07/11/2024   Procedure: ARTHROPLASTY, HIP, TOTAL,POSTERIOR APPROACH;  Surgeon: Josefina Chew, MD;  Location: [facility];  Service: Orthopedics;  Laterality: Right;   Patient Active Problem List   Diagnosis Date Noted   Osteoarthritis of left hip 09/23/2021   Primary osteoarthritis of both hips 07/17/2021   Patellofemoral pain syndrome of both knees 07/17/2021   H/O colectomy 12/12/2020   Arthritis 12/12/2020   Diverticulosis 12/12/2020   Herniated nucleus pulposus, L5-S1 02/04/2016   Asthma 11/02/2008    PCP: [facility],  [facility]  REFERRING PROVIDER: Delores Army POUR, PA-C  REFERRING DIAG: [id number] (ICD-10-CM) - Presence of right artificial hip joint   THERAPY DIAG:  Difficulty in walking, not elsewhere classified  Muscle weakness (generalized)  Pain in left hip  Unsteadiness on feet  Localized edema  Rationale for Evaluation and Treatment: Rehabilitation  ONSET DATE: 07/11/24  SUBJECTIVE:   SUBJECTIVE STATEMENT:  Pt reports she is gradually improving. Has an appt c Dr. Josefina on 12/29.  PERTINENT HISTORY: High BMI, smokes, asthma. L THA 03/21/24, HOH  PAIN:  Are you having pain?  Yes: NPRS scale: 0/10 Pain location: R gluteal, lat hip, and thigh Pain description: Ache, throb Aggravating factors: movement of the R leg, standing and walking Relieving factors: pain medication  PRECAUTIONS: None  RED FLAGS: None   WEIGHT BEARING RESTRICTIONS: No  FALLS:  Has patient fallen in last 6 months? No  LIVING ENVIRONMENT: Lives with: lives with their family Lives in: House/apartment Stairs: Yes: External: 5 steps; can reach both Has following equipment at home: Single point cane, Walker - 4 wheeled, Marine Scientist  OCCUPATION: disability  PLOF: Independent with household mobility with device and Independent with community mobility with device  PATIENT GOALS: To improve ability to walk   OBJECTIVE:  Note: Objective measures were completed  at Evaluation unless otherwise noted.  PATIENT SURVEYS:  LEFS: 1/80= 1%  COGNITION: Overall cognitive status: Within functional limits for tasks assessed     SENSATION: WFL  EDEMA:  Swelling of the R hip  MUSCLE LENGTH: Hamstrings: Right NT deg; Left NT deg Debby test: Right NT deg; Left NT deg  POSTURE: rounded shoulders, forward head, and flexed trunk   PALPATION: TTP to the R peri-hip area  LOWER EXTREMITY ROM: Able to complete a complete heel slide into flexion Not able to fully extend her R leg with min knee  flexion Active ROM Right eval Left eval  Hip flexion    Hip extension    Hip abduction    Hip adduction    Hip internal rotation    Hip external rotation    Knee flexion    Knee extension    Ankle dorsiflexion    Ankle plantarflexion    Ankle inversion    Ankle eversion     (Blank rows = not tested)  LOWER EXTREMITY MMT: Not able to complete a SLR Able to complete a LAQ Needs belt assist to complete hip abd in supine MMT Right eval Left eval  Hip flexion 2   Hip extension 2   Hip abduction 2   Hip adduction    Hip internal rotation    Hip external rotation 2   Knee flexion 3   Knee extension 3   Ankle dorsiflexion    Ankle plantarflexion    Ankle inversion    Ankle eversion     (Blank rows = not tested)  FUNCTIONAL TESTS:  5 times sit to stand: TBA when pt is able to walk c a SPC 2 minute walk test: TBA when pt is able to walk with a SPC  GAIT: Distance walked: 60' Assistive device utilized: Environmental Consultant - 4 wheeled Level of assistance: SBA Comments: Antalgic pattern over R LE, decreased pace                                                                                                                  TREATMENT DATE:  [facility] Adult PT Treatment:                                                DATE: 08/17/24 Therapeutic Activity: Nustep 16m L5 UE/LE 410 Standing heel/toe raises 3x10 Standing hip abd 3# x12 x10  Standing hip abd 3# x12 x10 Standing leg curls 3# x15 x12 SL standing Partial tandem standing STS x10 (lowest table setting)  08/08/24 Therapeutic Exercise/Activity: HEP reassessment and update LAQ GTB x8 BL, Blue TB x6x3s, x8x3s Black clamshell x4 (DC!), green TB clamshell 2x8x3s Green TB march 2x6x3s  Green TB HS curl x8x3s, Blue TB HS curl x6x3s 8 inch step up x8 (S UE) x8 (no UE) STS x5 (lowest table setting)   PATIENT EDUCATION:  Education details:  Eval findings, POC, HEP, self care Person educated: Patient and Spouse Education  method: Explanation, Demonstration, Tactile cues, Verbal cues, and Handouts Education comprehension: verbalized understanding, returned demonstration, verbal cues required, and tactile cues required  HOME EXERCISE PROGRAM: Access Code: [email or web address] URL: [email or web address] Date: 07/17/2024 Prepared by: Alm Kingdom  Exercises - Seated Long Arc Quad  - 3 x daily - 7 x weekly - 1 sets - 5-10 reps - 3 hold - Supine Gluteal Sets  - 1 x daily - 7 x weekly - 1 sets - 5-10 reps - 5 hold - Supine Quad Set  - 3 x daily - 7 x weekly - 1 sets - 5-10 reps - 5 hold - Supine Heel Slide with Strap (Mirrored)  - 3 x daily - 7 x weekly - 1 sets - 5-10 reps - 5 hold - Supine Hip Abduction (Mirrored)  - 3 x daily - 7 x weekly - 1 sets - 5-10 reps - 3 hold - Hooklying Clamshell with Resistance  - 3 x daily - 7 x weekly - 3 sets - 10 reps - green band hold  ASSESSMENT:  CLINICAL IMPRESSION: Assessed . Pt walks with in-toeing bllat, L>R, and decreased pelvic horizontal rotation. Pt reported medium DOE at end of . PT was completed for exs in the [facility] for strengthening, balance, and functional mobility. Pt tolerated prescribed exs without adverse effects. Pt demonstrates appropriate walking balance. Pt will continue to benefit from skilled PT to address impairments for improved functional mobility.  EVAL: Patient is a 58 y.o. female who was seen today for physical therapy evaluation and treatment for R THA, posterior approach. Pt presents to PT with decreased R LE strength and function following a THA 2 days previously. Pt reports greater pain with the R THA vs the L THA this summer. Pt was instructed in and completed a HEP to promote ROM and strength of the R LE. Self care for use of a cold pain to assist in the management pain and swelling was also provided. Pt will benefit from skilled PT 2w8 to address impairments to optimize R LE function with less pain.   OBJECTIVE IMPAIRMENTS: decreased  activity tolerance, decreased balance, decreased mobility, difficulty walking, decreased ROM, decreased strength, increased edema, pain, and high BMI.   ACTIVITY LIMITATIONS: carrying, lifting, bending, sitting, standing, squatting, sleeping, stairs, transfers, bed mobility, bathing, toileting, dressing, hygiene/grooming, locomotion level, and caring for others  PARTICIPATION LIMITATIONS: meal prep, cleaning, laundry, driving, shopping, and community activity  PERSONAL FACTORS: Fitness, Past/current experiences, Time since onset of injury/illness/exacerbation, and 3+ comorbidities: High BMI, smokes, asthma. L THA  are also affecting patient's functional outcome.   REHAB POTENTIAL: Good  CLINICAL DECISION MAKING: Evolving/moderate complexity  EVALUATION COMPLEXITY: Moderate   GOALS:  SHORT TERM GOALS: Target date: 08/04/24 Pt will be Ind in an initial HEP  Baseline: started Goal status: INITIAL  2.  Pt will demonstrate improved R LE strength, completing a SLR x10, as indication to progress gait to assist with a SPC  Baseline: Able to to complete a SLR Goal status: INITIAL  3.  Pt's ambulation tolerance will increase to 250' ft c mod ind c a rollator/SPC for improved mobility in home and in the community  Baseline: 75' c rollator SBA Goal status: INITIAL  LONG TERM GOALS: Target date: 09/16/23  Pt will be Ind in a final HEP to maintain achieved LOF  Baseline: started Goal status: INITIAL  2.  Pt will demonstrate R hip strength to 4/5  and knee strength to 4+/5 to progress to ambulation  Baseline: se flow sheet Goal status: INITIAL  3.  Improve 5xSTS by MCID of 5 and by MCID of 89ft as indication of improved functional mobility  Baseline:TBA when pt is able to walk Ind c a SPC. 08/17/24: 410 Goal status: INITIAL  4.  Pt will be ind in ambulation up to 500' and with asc/dsc 12 steps c HR for community mobility  Baseline: rollator c SBA Goal status: INITIAL  5.  Pt's  LEFS will improve to 40% or greater as indication of improved function  Baseline: 1% Goal status: INITIAL   PLAN:  PT FREQUENCY: 2x/week  PT DURATION: 8 weeks  PLANNED INTERVENTIONS: 97164- PT Re-evaluation, 97110-Therapeutic exercises, 97530- Therapeutic activity, 97112- Neuromuscular re-education, 97535- Self Care, 02859- Manual therapy, [id number]- Gait training, [phone]- Electrical stimulation (manual), Patient/Family education, Balance training, Stair training, Taping, Joint mobilization, Cryotherapy, and Moist heat  PLAN FOR NEXT SESSION: Assess response to HEP; progress therex as indicated; use of modalities, and manual therapy as indicated.  Dago Jungwirth PT  08/17/2024 12:51 PM

## 2024-08-17 ENCOUNTER — Ambulatory Visit

## 2024-08-17 DIAGNOSIS — M25552 Pain in left hip: Secondary | ICD-10-CM | POA: Diagnosis not present

## 2024-08-17 DIAGNOSIS — R2681 Unsteadiness on feet: Secondary | ICD-10-CM

## 2024-08-17 DIAGNOSIS — R600 Localized edema: Secondary | ICD-10-CM

## 2024-08-17 DIAGNOSIS — R262 Difficulty in walking, not elsewhere classified: Secondary | ICD-10-CM

## 2024-08-17 DIAGNOSIS — M6281 Muscle weakness (generalized): Secondary | ICD-10-CM

## 2024-08-22 ENCOUNTER — Ambulatory Visit

## 2024-08-22 DIAGNOSIS — M25552 Pain in left hip: Secondary | ICD-10-CM | POA: Diagnosis not present

## 2024-08-22 DIAGNOSIS — M6281 Muscle weakness (generalized): Secondary | ICD-10-CM

## 2024-08-22 DIAGNOSIS — R262 Difficulty in walking, not elsewhere classified: Secondary | ICD-10-CM

## 2024-08-22 NOTE — Therapy (Signed)
 " OUTPATIENT PHYSICAL THERAPY TREATMENT   Patient Name: Savannah Mcneil MRN: [id number] DOB:Aug 05, 1966, 58 y.o., female Today's Date: 08/22/2024  END OF SESSION:  PT End of Session - 08/22/24 1332     Visit Number 9    Number of Visits 17    Date for Recertification  09/15/24    Authorization Type [place] MEDICAID HEALTHY BLUE    Authorization Time Period 03/24/24-06/22/22    Authorization - Number of Visits 11    PT Start Time 1315    PT Stop Time 1355    PT Time Calculation (min) 40 min    Activity Tolerance Patient tolerated treatment well;Patient limited by pain    Behavior During Therapy WFL for tasks assessed/performed                  Past Medical History:  Diagnosis Date   Arthritis    Asthma    Diverticulitis    GERD (gastroesophageal reflux disease)    Headache    Hx of mirgranes   Hypertension    Neuromuscular disorder (HCC)    carpal tunnel   Pneumonia    Past Surgical History:  Procedure Laterality Date   ABLATION     uterus   CARPAL TUNNEL RELEASE Right    COLECTOMY     COLONOSCOPY     DILATION AND CURETTAGE OF UTERUS     ESOPHAGOGASTRODUODENOSCOPY     ESOPHAGUS SURGERY     TOTAL HIP ARTHROPLASTY Left 03/21/2024   Procedure: ARTHROPLASTY, HIP, TOTAL,POSTERIOR APPROACH;  Surgeon: Josefina Chew, MD;  Location: [facility];  Service: Orthopedics;  Laterality: Left;   TOTAL HIP ARTHROPLASTY Right 07/11/2024   Procedure: ARTHROPLASTY, HIP, TOTAL,POSTERIOR APPROACH;  Surgeon: Josefina Chew, MD;  Location: [facility];  Service: Orthopedics;  Laterality: Right;   Patient Active Problem List   Diagnosis Date Noted   Osteoarthritis of left hip 09/23/2021   Primary osteoarthritis of both hips 07/17/2021   Patellofemoral pain syndrome of both knees 07/17/2021   H/O colectomy 12/12/2020   Arthritis 12/12/2020   Diverticulosis 12/12/2020   Herniated nucleus pulposus, L5-S1 02/04/2016   Asthma 11/02/2008    PCP: [facility], [facility]  REFERRING  PROVIDER: Delores Army POUR, PA-C  REFERRING DIAG: [phone] (ICD-10-CM) - Presence of right artificial hip joint   THERAPY DIAG:  Difficulty in walking, not elsewhere classified  Muscle weakness (generalized)  Rationale for Evaluation and Treatment: Rehabilitation  ONSET DATE: 07/11/24  SUBJECTIVE:   SUBJECTIVE STATEMENT:  1/2 out of 10 soreness. Feeling good today. Been trying to do HEP at counter.  PERTINENT HISTORY: High BMI, smokes, asthma. L THA 03/21/24, HOH  PAIN:  Are you having pain?  Yes: NPRS scale: 0/10 Pain location: R gluteal, lat hip, and thigh Pain description: Ache, throb Aggravating factors: movement of the R leg, standing and walking Relieving factors: pain medication  PRECAUTIONS: None  RED FLAGS: None   WEIGHT BEARING RESTRICTIONS: No  FALLS:  Has patient fallen in last 6 months? No  LIVING ENVIRONMENT: Lives with: lives with their family Lives in: House/apartment Stairs: Yes: External: 5 steps; can reach both Has following equipment at home: Single point cane, Walker - 4 wheeled, Marine Scientist  OCCUPATION: disability  PLOF: Independent with household mobility with device and Independent with community mobility with device  PATIENT GOALS: To improve ability to walk   OBJECTIVE:  Note: Objective measures were completed at Evaluation unless otherwise noted.  PATIENT SURVEYS:  LEFS: 1/80= 1%  COGNITION: Overall cognitive status:  Within functional limits for tasks assessed     SENSATION: WFL  EDEMA:  Swelling of the R hip  MUSCLE LENGTH: Hamstrings: Right NT deg; Left NT deg Debby test: Right NT deg; Left NT deg  POSTURE: rounded shoulders, forward head, and flexed trunk   PALPATION: TTP to the R peri-hip area  LOWER EXTREMITY ROM: Able to complete a complete heel slide into flexion Not able to fully extend her R leg with min knee flexion Active ROM Right eval Left eval  Hip flexion    Hip extension    Hip abduction     Hip adduction    Hip internal rotation    Hip external rotation    Knee flexion    Knee extension    Ankle dorsiflexion    Ankle plantarflexion    Ankle inversion    Ankle eversion     (Blank rows = not tested)  LOWER EXTREMITY MMT: Not able to complete a SLR Able to complete a LAQ Needs belt assist to complete hip abd in supine MMT Right eval Left eval  Hip flexion 2   Hip extension 2   Hip abduction 2   Hip adduction    Hip internal rotation    Hip external rotation 2   Knee flexion 3   Knee extension 3   Ankle dorsiflexion    Ankle plantarflexion    Ankle inversion    Ankle eversion     (Blank rows = not tested)  FUNCTIONAL TESTS:  5 times sit to stand: TBA when pt is able to walk c a SPC 2 minute walk test: TBA when pt is able to walk with a SPC  GAIT: Distance walked: 60' Assistive device utilized: Environmental Consultant - 4 wheeled Level of assistance: SBA Comments: Antalgic pattern over R LE, decreased pace                                                                                                                  TREATMENT DATE:  08/22/24 Therapeutic Exercise/Activity: HEP reassessment and update LAQ Blue TB  2x8x3s Blue TB clamshell 2x8x3s Green TB march 2x6x3s  Blue TB HS curl x8x3s, black TB 2x8x3s STS 2x8 (lowest table setting) Standing marches x6x3s  Did not do: 8 inch step up x8 (S UE) x8 (no UE)    [facility] Adult PT Treatment:                                                DATE: 08/17/24 Therapeutic Activity: Nustep 68m L5 UE/LE 410 Standing heel/toe raises 3x10 Standing hip abd 3# x12 x10  Standing hip abd 3# x12 x10 Standing leg curls 3# x15 x12 SL standing Partial tandem standing STS x10 (lowest table setting)  08/08/24 Therapeutic Exercise/Activity: HEP reassessment and update LAQ GTB x8 BL, Blue TB x6x3s, x8x3s Black clamshell x4 (DC!), green TB clamshell 2x8x3s  Green TB march 2x6x3s  Green TB HS curl x8x3s, Blue TB HS curl  x6x3s 8 inch step up x8 (S UE) x8 (no UE) STS x5 (lowest table setting)   PATIENT EDUCATION:  Education details: Eval findings, POC, HEP, self care Person educated: Patient and Spouse Education method: Explanation, Demonstration, Tactile cues, Verbal cues, and Handouts Education comprehension: verbalized understanding, returned demonstration, verbal cues required, and tactile cues required  HOME EXERCISE PROGRAM: Access Code: [email or web address] URL: [email or web address] Date: 07/17/2024 Prepared by: Alm Kingdom  Exercises - Seated Long Arc Quad  - 3 x daily - 7 x weekly - 1 sets - 5-10 reps - 3 hold - Supine Gluteal Sets  - 1 x daily - 7 x weekly - 1 sets - 5-10 reps - 5 hold - Supine Quad Set  - 3 x daily - 7 x weekly - 1 sets - 5-10 reps - 5 hold - Supine Heel Slide with Strap (Mirrored)  - 3 x daily - 7 x weekly - 1 sets - 5-10 reps - 5 hold - Supine Hip Abduction (Mirrored)  - 3 x daily - 7 x weekly - 1 sets - 5-10 reps - 3 hold - Hooklying Clamshell with Resistance  - 3 x daily - 7 x weekly - 3 sets - 10 reps - green band hold  ASSESSMENT:  CLINICAL IMPRESSION: Patient tolerated treatment with no increases in pain with progressions in BL LE loading via isolated and functional exercises. Current deficits include: functional activity tolerance/strength, and balance. As a result, patient would continue to benefit from skilled PT to address said deficits via plan below.    EVAL: Patient is a 58 y.o. female who was seen today for physical therapy evaluation and treatment for R THA, posterior approach. Pt presents to PT with decreased R LE strength and function following a THA 2 days previously. Pt reports greater pain with the R THA vs the L THA this summer. Pt was instructed in and completed a HEP to promote ROM and strength of the R LE. Self care for use of a cold pain to assist in the management pain and swelling was also provided. Pt will benefit from skilled PT 2w8 to  address impairments to optimize R LE function with less pain.   OBJECTIVE IMPAIRMENTS: decreased activity tolerance, decreased balance, decreased mobility, difficulty walking, decreased ROM, decreased strength, increased edema, pain, and high BMI.   ACTIVITY LIMITATIONS: carrying, lifting, bending, sitting, standing, squatting, sleeping, stairs, transfers, bed mobility, bathing, toileting, dressing, hygiene/grooming, locomotion level, and caring for others  PARTICIPATION LIMITATIONS: meal prep, cleaning, laundry, driving, shopping, and community activity  PERSONAL FACTORS: Fitness, Past/current experiences, Time since onset of injury/illness/exacerbation, and 3+ comorbidities: High BMI, smokes, asthma. L THA  are also affecting patient's functional outcome.   REHAB POTENTIAL: Good  CLINICAL DECISION MAKING: Evolving/moderate complexity  EVALUATION COMPLEXITY: Moderate   GOALS:  SHORT TERM GOALS: Target date: 08/04/24 Pt will be Ind in an initial HEP  Baseline: started Goal status: MET  2.  Pt will demonstrate improved R LE strength, completing a SLR x10, as indication to progress gait to assist with a SPC  Baseline: Able to to complete a SLR Goal status: INITIAL  3.  Pt's ambulation tolerance will increase to 250' ft c mod ind c a rollator/SPC for improved mobility in home and in the community  Baseline: 75' c rollator SBA Goal status: MET  LONG TERM GOALS: Target date: 09/16/23  Pt will be Ind in a final  HEP to maintain achieved LOF  Baseline: started Goal status: in progress  2.  Pt will demonstrate R hip strength to 4/5 and knee strength to 4+/5 to progress to ambulation  Baseline: se flow sheet Goal status: INITIAL  3.  Improve 5xSTS by MCID of 5 and by MCID of 45ft as indication of improved functional mobility  Baseline:TBA when pt is able to walk Ind c a SPC. 08/17/24: 410 Goal status: in progress  4.  Pt will be ind in ambulation up to 500' and with asc/dsc  12 steps c HR for community mobility  Baseline: rollator c SBA Goal status: INITIAL  5.  Pt's LEFS will improve to 40% or greater as indication of improved function  Baseline: 1% Goal status: INITIAL   PLAN:  PT FREQUENCY: 2x/week  PT DURATION: 8 weeks  PLANNED INTERVENTIONS: 97164- PT Re-evaluation, 97110-Therapeutic exercises, 97530- Therapeutic activity, 97112- Neuromuscular re-education, 97535- Self Care, 02859- Manual therapy, [id number]- Gait training, [id number]- Electrical stimulation (manual), Patient/Family education, Balance training, Stair training, Taping, Joint mobilization, Cryotherapy, and Moist heat  PLAN FOR NEXT SESSION: Assess response to HEP; progress therex as indicated; use of modalities, and manual therapy as indicated.  Washington Greener Marlowe Cinquemani PT  08/22/2024 2:11 PM     "

## 2024-08-25 ENCOUNTER — Ambulatory Visit

## 2024-08-29 ENCOUNTER — Ambulatory Visit

## 2024-09-01 ENCOUNTER — Ambulatory Visit
